# Patient Record
Sex: Female | Born: 1940 | Race: White | Hispanic: No | Marital: Married | Smoking: Former smoker
Health system: Southern US, Community
[De-identification: ages and names within clinical notes are randomized; demographics above are authoritative.]

## PROBLEM LIST (undated history)

## (undated) ENCOUNTER — Emergency Department (HOSPITAL_BASED_OUTPATIENT_CLINIC_OR_DEPARTMENT_OTHER): Discharge status: Absent without leave | Payer: Medicare PPO

## (undated) DIAGNOSIS — C801 Malignant (primary) neoplasm, unspecified: Secondary | ICD-10-CM

## (undated) DIAGNOSIS — Z9889 Other specified postprocedural states: Secondary | ICD-10-CM

## (undated) DIAGNOSIS — T4145XA Adverse effect of unspecified anesthetic, initial encounter: Secondary | ICD-10-CM

## (undated) DIAGNOSIS — T7840XA Allergy, unspecified, initial encounter: Secondary | ICD-10-CM

## (undated) DIAGNOSIS — M1990 Unspecified osteoarthritis, unspecified site: Secondary | ICD-10-CM

## (undated) DIAGNOSIS — K219 Gastro-esophageal reflux disease without esophagitis: Secondary | ICD-10-CM

## (undated) DIAGNOSIS — T8859XA Other complications of anesthesia, initial encounter: Secondary | ICD-10-CM

## (undated) DIAGNOSIS — R112 Nausea with vomiting, unspecified: Secondary | ICD-10-CM

## (undated) DIAGNOSIS — I499 Cardiac arrhythmia, unspecified: Secondary | ICD-10-CM

## (undated) DIAGNOSIS — M810 Age-related osteoporosis without current pathological fracture: Secondary | ICD-10-CM

## (undated) DIAGNOSIS — H269 Unspecified cataract: Secondary | ICD-10-CM

## (undated) HISTORY — PX: TUBAL LIGATION: SHX77

## (undated) HISTORY — PX: PARATHYROIDECTOMY: SHX19

## (undated) HISTORY — PX: APPENDECTOMY: SHX54

## (undated) HISTORY — DX: Malignant (primary) neoplasm, unspecified: C80.1

## (undated) HISTORY — DX: Age-related osteoporosis without current pathological fracture: M81.0

## (undated) HISTORY — PX: HERNIA REPAIR: SHX51

## (undated) HISTORY — DX: Allergy, unspecified, initial encounter: T78.40XA

---
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
## 1999-04-11 ENCOUNTER — Other Ambulatory Visit: Admission: RE | Admit: 1999-04-11 | Discharge: 1999-04-11 | Payer: Self-pay | Admitting: *Deleted

## 2000-04-09 ENCOUNTER — Other Ambulatory Visit: Admission: RE | Admit: 2000-04-09 | Discharge: 2000-04-09 | Payer: Self-pay | Admitting: *Deleted

## 2000-07-14 ENCOUNTER — Ambulatory Visit (HOSPITAL_COMMUNITY): Admission: RE | Admit: 2000-07-14 | Discharge: 2000-07-14 | Payer: Self-pay | Admitting: Gastroenterology

## 2000-11-19 ENCOUNTER — Other Ambulatory Visit: Admission: RE | Admit: 2000-11-19 | Discharge: 2000-11-19 | Payer: Self-pay | Admitting: Radiology

## 2001-03-04 HISTORY — PX: BREAST SURGERY: SHX581

## 2001-12-30 ENCOUNTER — Encounter: Payer: Self-pay | Admitting: General Surgery

## 2001-12-30 ENCOUNTER — Encounter: Admission: RE | Admit: 2001-12-30 | Discharge: 2001-12-30 | Payer: Self-pay | Admitting: General Surgery

## 2002-01-04 ENCOUNTER — Encounter (INDEPENDENT_AMBULATORY_CARE_PROVIDER_SITE_OTHER): Payer: Self-pay | Admitting: *Deleted

## 2002-01-04 ENCOUNTER — Encounter: Admission: RE | Admit: 2002-01-04 | Discharge: 2002-01-04 | Payer: Self-pay | Admitting: Surgery

## 2002-01-04 ENCOUNTER — Encounter: Payer: Self-pay | Admitting: Surgery

## 2002-01-04 ENCOUNTER — Ambulatory Visit (HOSPITAL_BASED_OUTPATIENT_CLINIC_OR_DEPARTMENT_OTHER): Admission: RE | Admit: 2002-01-04 | Discharge: 2002-01-04 | Payer: Self-pay | Admitting: Surgery

## 2002-06-30 ENCOUNTER — Other Ambulatory Visit: Admission: RE | Admit: 2002-06-30 | Discharge: 2002-06-30 | Payer: Self-pay | Admitting: Obstetrics and Gynecology

## 2003-07-05 ENCOUNTER — Encounter: Admission: RE | Admit: 2003-07-05 | Discharge: 2003-07-05 | Payer: Self-pay | Admitting: Internal Medicine

## 2003-08-10 ENCOUNTER — Other Ambulatory Visit: Admission: RE | Admit: 2003-08-10 | Discharge: 2003-08-10 | Payer: Self-pay | Admitting: *Deleted

## 2004-07-26 ENCOUNTER — Other Ambulatory Visit: Admission: RE | Admit: 2004-07-26 | Discharge: 2004-07-26 | Payer: Self-pay | Admitting: Internal Medicine

## 2005-03-26 ENCOUNTER — Encounter: Admission: RE | Admit: 2005-03-26 | Discharge: 2005-03-26 | Payer: Self-pay | Admitting: Internal Medicine

## 2005-08-16 ENCOUNTER — Other Ambulatory Visit: Admission: RE | Admit: 2005-08-16 | Discharge: 2005-08-16 | Payer: Self-pay | Admitting: Internal Medicine

## 2006-04-15 ENCOUNTER — Encounter: Admission: RE | Admit: 2006-04-15 | Discharge: 2006-04-15 | Payer: Self-pay | Admitting: Internal Medicine

## 2006-09-30 ENCOUNTER — Other Ambulatory Visit: Admission: RE | Admit: 2006-09-30 | Discharge: 2006-09-30 | Payer: Self-pay | Admitting: Internal Medicine

## 2008-11-01 ENCOUNTER — Inpatient Hospital Stay (HOSPITAL_COMMUNITY): Admission: EM | Admit: 2008-11-01 | Discharge: 2008-11-02 | Payer: Self-pay | Admitting: Emergency Medicine

## 2009-05-30 ENCOUNTER — Encounter: Admission: RE | Admit: 2009-05-30 | Discharge: 2009-05-30 | Payer: Self-pay | Admitting: Emergency Medicine

## 2009-09-08 ENCOUNTER — Encounter: Admission: RE | Admit: 2009-09-08 | Discharge: 2009-09-08 | Payer: Self-pay | Admitting: Emergency Medicine

## 2009-09-29 ENCOUNTER — Encounter: Admission: RE | Admit: 2009-09-29 | Discharge: 2009-09-29 | Payer: Self-pay | Admitting: Emergency Medicine

## 2010-01-23 ENCOUNTER — Encounter: Admission: RE | Admit: 2010-01-23 | Discharge: 2010-01-23 | Payer: Self-pay | Admitting: Emergency Medicine

## 2010-05-01 ENCOUNTER — Other Ambulatory Visit: Payer: Self-pay | Admitting: Dermatology

## 2010-06-08 LAB — HEMOGLOBIN A1C
Hgb A1c MFr Bld: 5.5 % (ref 4.6–6.1)
Mean Plasma Glucose: 111 mg/dL

## 2010-06-08 LAB — CARDIAC PANEL(CRET KIN+CKTOT+MB+TROPI)
CK, MB: 0.9 ng/mL (ref 0.3–4.0)
Total CK: 56 U/L (ref 7–177)
Troponin I: 0.01 ng/mL (ref 0.00–0.06)

## 2010-06-08 LAB — LIPID PANEL
HDL: 60 mg/dL (ref >39)
Total CHOL/HDL Ratio: 3.1 RATIO

## 2010-06-09 LAB — COMPREHENSIVE METABOLIC PANEL
ALT: 20 U/L (ref 0–35)
Chloride: 103 mEq/L (ref 96–112)
GFR calc Af Amer: >60 mL/min (ref >60)
Potassium: 3.7 mEq/L (ref 3.5–5.1)
Sodium: 141 mEq/L (ref 135–145)
Total Protein: 7.2 g/dL (ref 6.0–8.3)

## 2010-06-09 LAB — PTH, INTACT AND CALCIUM
Calcium, Total (PTH): 10.9 mg/dL — ABNORMAL HIGH (ref 8.4–10.5)
PTH: 95.2 pg/mL — ABNORMAL HIGH (ref 14.0–72.0)

## 2010-06-09 LAB — APTT: aPTT: 27 seconds (ref 24–37)

## 2010-06-09 LAB — VITAMIN B12: Vitamin B-12: 701 pg/mL (ref 211–911)

## 2010-06-09 LAB — DIFFERENTIAL
Basophils Absolute: 0 10*3/uL (ref 0.0–0.1)
Eosinophils Relative: 1 % (ref 0–5)
Lymphs Abs: 1.2 10*3/uL (ref 0.7–4.0)
Neutrophils Relative %: 71 % (ref 43–77)

## 2010-06-09 LAB — CBC
Hemoglobin: 14.3 g/dL (ref 12.0–15.0)
Platelets: 255 10*3/uL (ref 150–400)
RBC: 4.77 MIL/uL (ref 3.87–5.11)

## 2010-06-09 LAB — CK TOTAL AND CKMB (NOT AT ARMC)
CK, MB: 1.6 ng/mL (ref 0.3–4.0)
Relative Index: INVALID (ref 0.0–2.5)

## 2010-06-09 LAB — PROTIME-INR
INR: 0.9 (ref 0.00–1.49)
Prothrombin Time: 12.4 seconds (ref 11.6–15.2)

## 2010-06-09 LAB — TROPONIN I: Troponin I: 0.04 ng/mL (ref 0.00–0.06)

## 2010-06-09 LAB — CARDIAC PANEL(CRET KIN+CKTOT+MB+TROPI): Total CK: 67 U/L (ref 7–177)

## 2010-07-17 NOTE — Discharge Summary (Signed)
[name], [name]            ACCOUNT NO.:  [id number]   MEDICAL RECORD NO.:  [id number]          PATIENT TYPE:  INP   LOCATION:  4734                         FACILITY:  [facility]   PHYSICIAN:  Peggye Pitt, M.D. DATE OF BIRTH:  02-05-1941   DATE OF ADMISSION:  11/01/2008  DATE OF DISCHARGE:  11/02/2008                               DISCHARGE SUMMARY   DISCHARGE DIAGNOSES:  1. Atypical chest pain, ruled out for acute coronary syndrome.  2. Hypercalcemia.  3. Glaucoma.  4. Gastroesophageal reflux disease.  5. Intraductal papilloma of the right breast.   DISCHARGE MEDICATIONS:  1. Aspirin 81 mg daily.  2. Xalatan 0.005 eye drops 1 drop in each eye at bedtime.   DISPOSITION AND FOLLOWUP:  [name] will be discharged home today  in stable condition.  She is instructed to follow up with her primary  care physician, Dr. Nicholos Fox in about 4 weeks, at that time a  calcium level should be drawn, as she has been hypercalcemic this  hospitalization.  At the time of followup, PCP should also assess need  for outpatient stress test.  I do not believe at this point strongly  that she needs a stress test given the characteristics of her chest pain  and the fact that she has absolutely no coronary artery disease risk  factors.   CONSULTATION THIS HOSPITALIZATION:  None.   IMAGES AND PROCEDURES:  A chest x-ray on November 01, 2008, that showed no  acute process.   HISTORY AND PHYSICAL EXAMINATION:  For full details, please see  dictation by Dr. Radonna Fox on November 01, 2008.  In brief, [name] is a  70 year old Caucasian woman with history of GERD and intraductal  papilloma of the right breast who presents to the hospital with a chest  pain.  She described the pain as a tightness over her left chest area  that had no radiation, was not related to exertion.  For further  details, see H and P.  Because of this, she was admitted to our service  for further evaluation and  management.   HOSPITAL COURSE:  1. Chest pain.  She has ruled out for acute coronary syndrome by the      way of 3 sets of negative cardiac enzymes and an EKG that showed no      acute ST or T-wave changes.  She does not have any coronary artery      disease risk factors.  A fasting lipid profile was drawn to further      risk stratify and a total cholesterol is 184, triglycerides of 59,      and HDL of 60 and LDL of 112.  She does not have hyperlipidemia.  I      did not feel strongly that a stress test is needed, I will defer      this to Dr. Nicholos Fox.  2. Hypercalcemia.  Upon admission, her calcium level was found to be      11.2 with a normal albumin, so this is a true calcium level.  It      appears that she takes  calcium supplementation 3 times a day and      this could indeed be the source of her hypercalcemia.  I have asked      her to discontinue all calcium supplementation.  A PTH has been      ordered but is pending at the time of this dictation.  I would      suggest that she had a calcium level rechecked in about 4 weeks at      Dr. Carolyn Fox office.  3. Rest of her chronic medical problems have not been an issue this      hospitalization.  4. Vital signs on day of discharge, blood pressure 122/75, heart rate      64, respirations 20, O2 sats 99% on room air with a temp of 97.4.      Peggye Pitt, M.D.  Electronically Signed     EH/MEDQ  D:  11/02/2008  T:  11/03/2008  Job:  [id number]   cc:   Georgianne Fick, M.D.

## 2010-07-17 NOTE — H&P (Signed)
Hailey Fox, Hailey Fox NO.:  [id number]   MEDICAL RECORD NO.:  [id number]          PATIENT TYPE:  INP   LOCATION:  4734                         FACILITY:  [facility]   PHYSICIAN:  Marinda Elk, M.D.DATE OF BIRTH:  06/21/1940   DATE OF ADMISSION:  11/01/2008  DATE OF DISCHARGE:                              HISTORY & PHYSICAL   PRIMARY CARE PHYSICIAN:  Dr. Mia Fox.   HISTORY OF PRESENT ILLNESS:  [name] is a 70 year old with no  significant cardiac risk factors that comes in for chest discomfort that  started at 4 a.m.  She relates this started as chest discomfort, which  eventually went to her chest that she describe as chest tightness.  She  says in the past she has had GERD, but this is felt differently.  She  relates that nothing makes it worse, but was relieved with oxygen and  nitroglycerins.  She relates she was able to come walking to the ED with  no exertional chest pain.  She relates no palpitation, no sweating, no  nausea, no vomiting.  No change in her vision.  At no point she did feel  lightheadedness.   ALLERGIES:  She is allergic to MACROBID and IV DYE, she gets hives.   PAST MEDICAL HISTORY:  Significant only for,  1. Right breast biopsy that showed intraductal papilloma.  2. GERD.  3. Glaucoma.   MEDICATIONS:  1. Xalatan 0.005% 1 drop in each eye at bedtime.  2. Calcium 1 tablet three times a day.  3. Excedrin, she does not know the dose.  Her last dose was last week.  4. She also takes a combination of acetaminophen, caffeine, aspirin,      which her last dose was 2 weeks ago.   SOCIAL HISTORY:  She drinks one cocktail daily.  Denies tobacco and  lives in [place] with her husband.   FAMILY HISTORY:  Her father died at the age of 70 of pneumonia and her  mother died at the age of 16 of complications of dementia.   REVIEW OF SYSTEMS:  Noncontributory.   REVIEW OF SYSTEMS:  VITAL SIGNS:  Her temperature is 97.2, heart rate is  79, respiration is 18, her blood pressure is 153/70.  She is breathing  99% on room air.  GENERAL APPEARANCE:  She appears in acute distress, lying in bed  comfortably, and talking fluently.  HEENT:  Eyes are anicteric.  NECK:  Supple.  No thyromegaly.  No bruits.  CARDIOVASCULAR:  She has a regular rate and rhythm with a positive S1  and S2 and a systolic ejection murmur with radiation to the axilla.  No  murmurs, no rubs, or gallops.  LUNGS:  She has good air movement and clear to auscultation.  ABDOMEN:  She has positive bowel sounds, nontender, nondistended, and  soft.  EXTREMITIES:  Positive pulses.  No edema.  SKIN:  No ecchymosis and no significant skin changes.  NEUROLOGICAL:  Cranial nerves III through XII are grossly intact.  Sensation is intact throughout.  Muscle strength is 5/5 in all 4  extremities.  Reflexes are 2+ bilaterally  and equal.  Finger-to-nose is  negative.  Proprioception is preserved.  Babinski is negative.   LABS ON ADMISSION:  CBC, white count 5.9, hemoglobin of 14.3 with  platelets of 255, an ANC of 4.2, and an MCV of 87.5.  Cardiac enzymes,  CK 75, CK-MB 1.6, and relative index could not be calculated.  Her  sodium is 141, potassium 3.7, chloride 103, bicarb of 28, glucose of  127, BUN of 13, creatinine of 0.9 with a GFR greater than 60, bilirubin  0.7, alkaline phosphatase 53, AST 29, ALT 20, total protein 7.2, albumin  4.3, and calcium of 11.2.  Troponin 0.04.  Chest x-ray shows cardiac  silhouette, normal size, normal shape, no calcifications, no pulmonary  edema, consolidation, pleural effusion, or evidence.  There is no  widened mediastinum and some mild osteopenia.  EKG shows normal sinus  rhythm with right axis deviation, no right ventricular hypertrophy, no T-  wave changes, no ST-segment changes.  No previous EKG to compare.   PROBLEM:  1. Chest pain.  We will admit the patient to a telemetry unit.  We      will check cardiac enzymes x3 q.8 h.  and an EKG in the morning.  We      will give her aspirin 325 now and 81 mg daily.  If cardiac enzymes      are negative and no changes on EKG in the morning, she will need a      stress test as an outpatient.  Her chest pain seems pretty      atypical.  She does take NSAIDs on and off and does consume alcohol      daily, which can be contributing or worsening her GERD or causing      her gastritis.  We will follow hemoglobin, FOBT, stools.  We will      also check a fasting lipid panel, TSH, B12, and a 2-D echo.  2. Glaucoma.  We will continue her Xalatan daily.  3. Prophylaxis.  We will do heparin prophylaxis and Protonix b.i.d.  4. Alcohol abuse.  We will start her on thiamine and folate and we      will monitor with CIWA protocol.  Also counseled her about reducing      her alcohol intake.  5. Hypercalcemia.  We will start calcium as this could be contributing      to her hypercalcemia.  She could not tell how much calcium is she      taking or is she taking it every day.  So, we will give her IV      fluids.  We will check a PTH, phosphorus, and vitamin D.  We will      do further management depending on results.  Her calcium supplement      could be contributing to this and she is on no other medication      which could be causing this.      Marinda Elk, M.D.  Electronically Signed     AF/MEDQ  D:  11/01/2008  T:  11/02/2008  Job:  [id number]

## 2010-07-20 NOTE — Op Note (Signed)
Hailey Fox, Hailey Fox                   ACCOUNT NO.:  [id number]   MEDICAL RECORD NO.:  [id number]                   PATIENT TYPE:  AMB   LOCATION:  DSC                                  FACILITY:  [facility]   PHYSICIAN:  Currie Paris, M.D.           DATE OF BIRTH:  07-Aug-1940   DATE OF PROCEDURE:  01/04/2002  DATE OF DISCHARGE:                                 OPERATIVE REPORT   [facility] [phone].   PREOPERATIVE DIAGNOSIS:  Probable intraductal papilloma, right side.   POSTOPERATIVE DIAGNOSIS:  Probable intraductal papilloma, right side.   PROCEDURE:  Right breast biopsy.   SURGEON:  Currie Paris, M.D.   ANESTHESIA:  General.   CLINICAL HISTORY:  This patient recently developed a bloody nipple discharge  and a ductogram was done.  This showed what looked like a papilloma and  after discussion with the patient, she elected to have this removed.   DESCRIPTION OF PROCEDURE:  The patient was seen in the holding area and had  no further questions.  She already had some blue dye injected into the right  milk duct that was producing the bloody nipple discharge, and this was  identified and the right side confirmed as the operative side.   She was taken to the operating room and after satisfactory general  anesthesia, the breast was prepped and draped.  I made a curvilinear  incision along the lateral aspect of the areolar margin and raised a flap  going toward the nipple itself.  I had already placed a tiny tear duct probe  into the nipple, and it went in all the way to the hilt.  Once I was able to  identify the duct, I then began to take excision around it, using the probe  in it to maintain some traction and also a general sense of direction.  Most  of this was done with cautery, and most of what I saw looked like some  fibrocystic disease with some creamy material consistent with that.  I got  beyond the tip of the probe, and I felt that I had adequate excision.   This  actually seemed to go fairly centrally down into the breast rather than too  far out laterally, but it did go somewhat laterally.   Once this was out, I could palpate a small mass within the specimen and felt  that I had an adequate excision.   The wound was checked for hemostasis, and that was controlled with the  cautery.  The wound was then closed with 3-0 Vicryl, followed by 4-0  Monocryl subcuticular and Steri-Strips.  The patient tolerated the procedure  well.  There were no operative complications.  All counts were correct.  Currie Paris, M.D.    CJS/MEDQ  D:  01/04/2002  T:  01/04/2002  Job:  [id number]   cc:   Elsie Stain, M.D.  [facility]  [address]  [place] [address]  Fax: [phone]   Neta Mends. Fabian Sharp, M.D. [facility]

## 2010-07-20 NOTE — Procedures (Signed)
[facility]  Patient:    Hailey Fox, Hailey Fox                MRN: [id number] Proc. Date: 07/14/00 Adm. Date:  [id number] Attending:  Louie Bun CC:         Andres Ege, M.D.   Procedure Report  PROCEDURE:  Colonoscopy.  INDICATION FOR PROCEDURE:  Personal history of colon polyps with last colonoscopy five years ago.  DESCRIPTION OF PROCEDURE:  The patient was placed in the left lateral decubitus position and placed on the pulse monitor with continuous low-flow oxygen delivered by nasal cannula.  She was sedated with 50 mg IV Demerol and 6 mg IV Versed.  The Olympus video colonoscope was inserted into the rectum and advanced to the cecum, confirmed by transillumination at McBurneys point and visualization of the ileocecal valve and appendiceal orifice.  The prep was good.  The cecum, ascending, transverse, descending, and sigmoid colon all appeared normal with no masses, polyps, diverticula, or other mucosal abnormalities.  The rectum likewise appeared normal, and retroflex view of the anus revealed only small internal hemorrhoids.  The colonoscope was then withdrawn, and the patient returned to the recovery room in stable condition. She tolerated the procedure well, and there were no immediate complications.  IMPRESSION:  Small internal hemorrhoids, otherwise normal colonoscopy.  PLAN:  Repeat colonoscopy in five years. DD:  07/14/00 TD:  07/14/00 Job: [id number]

## 2010-08-27 ENCOUNTER — Other Ambulatory Visit: Payer: Self-pay | Admitting: Emergency Medicine

## 2010-08-27 DIAGNOSIS — R102 Pelvic and perineal pain: Secondary | ICD-10-CM

## 2010-08-30 ENCOUNTER — Ambulatory Visit
Admission: RE | Admit: 2010-08-30 | Discharge: 2010-08-30 | Disposition: A | Discharge status: Home / Self Care | Payer: Medicare Other | Source: Ambulatory Visit | Attending: Emergency Medicine | Admitting: Emergency Medicine

## 2010-08-30 DIAGNOSIS — R102 Pelvic and perineal pain: Secondary | ICD-10-CM

## 2012-04-03 ENCOUNTER — Ambulatory Visit
Admission: RE | Admit: 2012-04-03 | Discharge: 2012-04-03 | Disposition: A | Discharge status: Home / Self Care | Payer: Medicare Other | Source: Ambulatory Visit | Attending: Family Medicine | Admitting: Family Medicine

## 2012-04-03 ENCOUNTER — Other Ambulatory Visit: Payer: Self-pay | Admitting: Family Medicine

## 2012-04-03 DIAGNOSIS — R05 Cough: Secondary | ICD-10-CM

## 2012-05-22 ENCOUNTER — Ambulatory Visit (INDEPENDENT_AMBULATORY_CARE_PROVIDER_SITE_OTHER): Payer: Self-pay | Admitting: Surgery

## 2012-05-26 ENCOUNTER — Ambulatory Visit (INDEPENDENT_AMBULATORY_CARE_PROVIDER_SITE_OTHER): Payer: Medicare Other | Admitting: Surgery

## 2012-05-26 ENCOUNTER — Encounter (INDEPENDENT_AMBULATORY_CARE_PROVIDER_SITE_OTHER): Payer: Self-pay | Admitting: Surgery

## 2012-05-26 VITALS — BP 128/88 | Temp 97.0°F | Resp 72 | Ht 62.5 in | Wt 137.4 lb

## 2012-05-26 DIAGNOSIS — E213 Hyperparathyroidism, unspecified: Secondary | ICD-10-CM | POA: Insufficient documentation

## 2012-05-26 DIAGNOSIS — K4090 Unilateral inguinal hernia, without obstruction or gangrene, not specified as recurrent: Secondary | ICD-10-CM

## 2012-05-26 NOTE — Patient Instructions (Signed)
Come back to see me when you're ready to go ahead and schedule surgery to repair your left inguinal hernia

## 2012-05-26 NOTE — Progress Notes (Signed)
Patient ID: Hailey Fox, female   DOB: 09-14-1940, 72 y.o.   MRN: [id number] NAME: STEFFI NOVIELLO DOB: 1940-12-01 MRN: [id number]                                                                                      DATE: 05/26/2012  PCP: Eartha Inch, MD Referring Provider: No ref. provider found  IMPRESSION:  1. Reducible left inguinal hernia 2. 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 Hyperparathyroidism  PLAN:   I recommended that he lipoma be left alone. I recommended that she consider repair of her left ankle hernia after she has completed evaluation for her hyperparathyroidism and likely parathyroid surgery which will be done for another month or 6 weeks. She'll call to come back and see me when she is ready to schedule                 CC:  Chief Complaint  Patient presents with  . Inguinal Hernia    Left and  thigh mass    HPI:  Hailey Fox is a 72 y.o.  female who presents for evaluation of a left inguinal hernia as well as a mass on the right thigh. The hernia been present about a year. It's basically asymptomatic. She noticeable when she stands up it goes away when she lies back down. It is not tender or painful. It always goes back and when she lies down. She's also noticed a small soft mass in the posterior right thigh that has not changed any since she first noticed a year ago. Likewise it is asymptomatic. She also notes that she has been evaluated for high calcium levels and elevated parathormone levels. She surgical consultation for possible parathyroidectomy pending with a physician in [place]. PMH:  has a past medical history of Osteoporosis; Allergy; and Cancer.  PSH:   has past surgical history that includes Tubal ligation (early 80's); Appendectomy (early 26's); Tonsillectomy (1945); and Breast surgery (Right, 2003).  ALLERGIES:   Allergies  Allergen Reactions  . Codeine     blackouts  . Contrast Media (Iodinated Diagnostic Agents) Hives   . Macrodantin (Nitrofurantoin Macrocrystal)   . Ciprofloxacin     Rapid pulse  . Iohexol      Desc: IMMEDIATE DEVELOPMENT OF HIVES POST INJECTION     MEDICATIONS: Current outpatient prescriptions:aspirin 81 MG tablet, Take 81 mg by mouth daily., Disp: , Rfl: ;  aspirin-acetaminophen-caffeine (EXCEDRIN MIGRAINE) 250-250-65 MG per tablet, Take 1 tablet by mouth every 6 (six) hours as needed for pain., Disp: , Rfl: ;  azelastine (ASTELIN) 137 MCG/SPRAY nasal spray, , Disp: , Rfl: ;  Cholecalciferol (VITAMIN D-3 PO), Take 2,000 mg by mouth daily., Disp: , Rfl:  ibuprofen (ADVIL,MOTRIN) 200 MG tablet, Take 200 mg by mouth daily., Disp: , Rfl:   ROS: She has filled out our 12 point review of systems and it is negative . EXAM:   VITAL SIGNS:  BP 128/88  Temp(Src) 97 F (36.1 C) (Temporal)  Resp 72  Ht 5' 2.5" (1.588 m)  Wt 137 lb 6.4 oz (62.324 kg)  BMI 24.71 kg/m2  SpO2 97%  GENERAL:  The patient  is alert, oriented, and generally healthy-appearing, NAD. Mood and affect are normal.  HEENT:  The head is normocephalic, the eyes nonicteric, the pupils were round regular and equal. EOMs are normal. Pharynx normal. Dentition good.  NECK:  The neck is supple and there are no masses or thyromegaly.  LUNGS: Normal respirations and clear to auscultation.  HEART: Regular rhythm, with no murmurs rubs or gallops. Pulses are intact carotid dorsalis pedis and posterior tibial. No significant varicosities are noted.  BREASTS:  not examined  ABDOMEN: Soft, flat, and nontender. No masses or organomegaly is noted.a reducible left inguinal hernia is present. Bowel sounds are normal.  EXTREMITIES:  Good range of motion, no edema.there is approximately 4 cm indistinct but soft mass in the right posterior thigh. Somewhat tender freely mobile and I think represents a lipoma   DATA REVIEWED:  I reviewed the notes from her primary care office as well as our old office notes    Dijuan Sleeth  J 05/26/2012  CC: No ref. provider found, Eartha Inch, MD

## 2012-08-14 ENCOUNTER — Encounter (INDEPENDENT_AMBULATORY_CARE_PROVIDER_SITE_OTHER): Payer: Self-pay | Admitting: Surgery

## 2012-08-14 ENCOUNTER — Encounter (HOSPITAL_COMMUNITY): Payer: Self-pay | Admitting: Pharmacy Technician

## 2012-08-14 ENCOUNTER — Ambulatory Visit (INDEPENDENT_AMBULATORY_CARE_PROVIDER_SITE_OTHER): Payer: Medicare Other | Admitting: Surgery

## 2012-08-14 VITALS — BP 132/90 | HR 60 | Temp 97.2°F | Resp 15 | Ht 62.5 in | Wt 136.2 lb

## 2012-08-14 DIAGNOSIS — K4090 Unilateral inguinal hernia, without obstruction or gangrene, not specified as recurrent: Secondary | ICD-10-CM

## 2012-08-14 NOTE — Patient Instructions (Signed)
We'll schedule outpatient hernia repair

## 2012-08-14 NOTE — Progress Notes (Signed)
Patient ID: Hailey Fox, female   DOB: 1940/11/24, 72 y.o.   MRN: [id number] NAME: Hailey Fox DOB: March 01, 1941 MRN: [id number]                                                                                      DATE: 08/14/2012  PCP: Eartha Inch, MD Referring Provider: Eartha Inch, MD  IMPRESSION:  1. Reducible left inguinal hernia 2. 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 S/P parathyroidectomy  PLAN:  Will proceed with [facility] repair I have discussed with the patient the fact that he has an inguinal hernia and reviewed the pathophysiology of that diagnosis. I have given her educational materials about this. I discussed options for treatment including observation or repair and have discussed both laparoscopic and open inguinal hernia repairs as potential techniques. We have discussed the use of mesh. We have discussed risks of surgery including bleeding, infection, recurrence, postoperative pain and possible chronic groin pain. I believe the patient's questions have been answered and that he has a good understanding of the issues                  CC:  Chief Complaint  Patient presents with  . Follow-up    reck hernia / sched sx    HPI:  Hailey Fox is a 72 y.o.  female who presents for discussion of LIH repair. I saw her a few months ago and since she has done well. She had her parathyroidectomy done and did well with surgery. She would now like to schedule [facility] repair. PMH:  has a past medical history of Osteoporosis; Allergy; and Cancer.  PSH:   has past surgical history that includes Tubal ligation (early 52's); Appendectomy (early 69's); Tonsillectomy (1945); Breast surgery (Right, 2003); and Parathyroidectomy ([id number]).  ALLERGIES:   Allergies  Allergen Reactions  . Codeine     blackouts  . Contrast Media (Iodinated Diagnostic Agents) Hives  . Macrodantin (Nitrofurantoin Macrocrystal)   . Ciprofloxacin     Rapid pulse  . Iohexol      Desc:  IMMEDIATE DEVELOPMENT OF HIVES POST INJECTION     MEDICATIONS: Current outpatient prescriptions:aspirin 81 MG tablet, Take 81 mg by mouth daily., Disp: , Rfl: ;  aspirin-acetaminophen-caffeine (EXCEDRIN MIGRAINE) 250-250-65 MG per tablet, Take 1 tablet by mouth every 6 (six) hours as needed for pain., Disp: , Rfl: ;  azelastine (ASTELIN) 137 MCG/SPRAY nasal spray, , Disp: , Rfl: ;  chlorpheniramine (CHLOR-TRIMETON) 4 MG tablet, Take 4 mg by mouth 2 (two) times daily as needed for allergies., Disp: , Rfl:  Cholecalciferol (VITAMIN D-3 PO), Take 2,000 mg by mouth daily., Disp: , Rfl: ;  ibuprofen (ADVIL,MOTRIN) 200 MG tablet, Take 200 mg by mouth daily., Disp: , Rfl:   ROS: She has filled out our 12 point review of systems and it is negative . EXAM:   VITAL SIGNS:  BP 132/90  Pulse 60  Temp(Src) 97.2 F (36.2 C) (Temporal)  Resp 15  Ht 5' 2.5" (1.588 m)  Wt 136 lb 3.2 oz (61.78 kg)  BMI 24.5 kg/m2  GENERAL:  The patient is alert, oriented, and generally healthy-appearing, NAD.  Mood and affect are normal.  HEENT:  The head is normocephalic, the eyes nonicteric, the pupils were round regular and equal. EOMs are normal. Pharynx normal. Dentition good.  NECK:  The neck is supple and there are no masses or thyromegaly.  LUNGS: Normal respirations and clear to auscultation.  HEART: Regular rhythm, with no murmurs rubs or gallops. Pulses are intact carotid dorsalis pedis and posterior tibial. No significant varicosities are noted.  BREASTS:  not examined  ABDOMEN: Soft, flat, and nontender. No masses or organomegaly is noted.a reducible left inguinal hernia is present. Bowel sounds are normal.  EXTREMITIES:  Good range of motion, no edema.   DATA REVIEWED:  I reviewed the notes from her primary care office as well as our old office notes    Hunter Bachar J 08/14/2012  CC: Eartha Inch, MD, Eartha Inch, MD

## 2012-08-17 ENCOUNTER — Encounter (HOSPITAL_COMMUNITY): Payer: Self-pay | Admitting: *Deleted

## 2012-08-17 MED ORDER — CEFAZOLIN SODIUM-DEXTROSE 2-3 GM-% IV SOLR
2.0000 g | INTRAVENOUS | Status: AC
  Administered 2012-08-18: 2 g via INTRAVENOUS
  Filled 2012-08-17: qty 50

## 2012-08-18 ENCOUNTER — Ambulatory Visit (HOSPITAL_COMMUNITY)
Admission: RE | Admit: 2012-08-18 | Discharge: 2012-08-18 | Disposition: A | Discharge status: Home / Self Care | Payer: Medicare Other | Source: Ambulatory Visit | Attending: Surgery | Admitting: Surgery

## 2012-08-18 ENCOUNTER — Encounter (HOSPITAL_COMMUNITY): Payer: Self-pay | Admitting: Certified Registered"

## 2012-08-18 ENCOUNTER — Encounter (HOSPITAL_COMMUNITY): Payer: Self-pay | Admitting: *Deleted

## 2012-08-18 ENCOUNTER — Encounter (HOSPITAL_COMMUNITY)
Admission: RE | Disposition: A | Discharge status: Home / Self Care | Payer: Self-pay | Source: Ambulatory Visit | Attending: Surgery

## 2012-08-18 ENCOUNTER — Ambulatory Visit (HOSPITAL_COMMUNITY): Payer: Medicare Other | Admitting: Certified Registered"

## 2012-08-18 DIAGNOSIS — Z883 Allergy status to other anti-infective agents status: Secondary | ICD-10-CM | POA: Insufficient documentation

## 2012-08-18 DIAGNOSIS — Z79899 Other long term (current) drug therapy: Secondary | ICD-10-CM | POA: Insufficient documentation

## 2012-08-18 DIAGNOSIS — K219 Gastro-esophageal reflux disease without esophagitis: Secondary | ICD-10-CM | POA: Insufficient documentation

## 2012-08-18 DIAGNOSIS — K4090 Unilateral inguinal hernia, without obstruction or gangrene, not specified as recurrent: Secondary | ICD-10-CM

## 2012-08-18 DIAGNOSIS — Z7982 Long term (current) use of aspirin: Secondary | ICD-10-CM | POA: Insufficient documentation

## 2012-08-18 DIAGNOSIS — Z9089 Acquired absence of other organs: Secondary | ICD-10-CM | POA: Insufficient documentation

## 2012-08-18 DIAGNOSIS — I498 Other specified cardiac arrhythmias: Secondary | ICD-10-CM | POA: Insufficient documentation

## 2012-08-18 DIAGNOSIS — Z91041 Radiographic dye allergy status: Secondary | ICD-10-CM | POA: Insufficient documentation

## 2012-08-18 DIAGNOSIS — Z885 Allergy status to narcotic agent status: Secondary | ICD-10-CM | POA: Insufficient documentation

## 2012-08-18 DIAGNOSIS — M810 Age-related osteoporosis without current pathological fracture: Secondary | ICD-10-CM | POA: Insufficient documentation

## 2012-08-18 DIAGNOSIS — Z859 Personal history of malignant neoplasm, unspecified: Secondary | ICD-10-CM | POA: Insufficient documentation

## 2012-08-18 HISTORY — PX: INGUINAL HERNIA REPAIR: SHX194

## 2012-08-18 HISTORY — DX: Unspecified osteoarthritis, unspecified site: M19.90

## 2012-08-18 HISTORY — DX: Other complications of anesthesia, initial encounter: T88.59XA

## 2012-08-18 HISTORY — DX: Unspecified cataract: H26.9

## 2012-08-18 HISTORY — DX: Gastro-esophageal reflux disease without esophagitis: K21.9

## 2012-08-18 HISTORY — DX: Other specified postprocedural states: Z98.890

## 2012-08-18 HISTORY — DX: Cardiac arrhythmia, unspecified: I49.9

## 2012-08-18 HISTORY — DX: Nausea with vomiting, unspecified: R11.2

## 2012-08-18 HISTORY — DX: Adverse effect of unspecified anesthetic, initial encounter: T41.45XA

## 2012-08-18 LAB — SURGICAL PCR SCREEN: Staphylococcus aureus: NEGATIVE

## 2012-08-18 LAB — CBC
HCT: 40.6 % (ref 36.0–46.0)
Hemoglobin: 13.8 g/dL (ref 12.0–15.0)
MCV: 84.9 fL (ref 78.0–100.0)
WBC: 6.6 10*3/uL (ref 4.0–10.5)

## 2012-08-18 SURGERY — REPAIR, HERNIA, INGUINAL, ADULT
Anesthesia: General | Site: Groin | Laterality: Left | Wound class: Clean

## 2012-08-18 MED ORDER — MIDAZOLAM HCL 5 MG/5ML IJ SOLN
INTRAMUSCULAR | Status: DC | PRN
  Administered 2012-08-18: 1 mg via INTRAVENOUS

## 2012-08-18 MED ORDER — CHLORHEXIDINE GLUCONATE 4 % EX LIQD: 1.0000 "application " | Freq: Once | CUTANEOUS | Status: DC

## 2012-08-18 MED ORDER — ARTIFICIAL TEARS OP OINT
TOPICAL_OINTMENT | OPHTHALMIC | Status: DC | PRN
  Administered 2012-08-18: 1 via OPHTHALMIC

## 2012-08-18 MED ORDER — HYDROCODONE-ACETAMINOPHEN 5-325 MG PO TABS: 1.0000 | ORAL_TABLET | ORAL | Status: DC | PRN

## 2012-08-18 MED ORDER — ROCURONIUM BROMIDE 100 MG/10ML IV SOLN
INTRAVENOUS | Status: DC | PRN
  Administered 2012-08-18: 35 mg via INTRAVENOUS

## 2012-08-18 MED ORDER — BUPIVACAINE-EPINEPHRINE (PF) 0.5% -1:200000 IJ SOLN
INTRAMUSCULAR | Status: AC
  Filled 2012-08-18: qty 10

## 2012-08-18 MED ORDER — FENTANYL CITRATE 0.05 MG/ML IJ SOLN
INTRAMUSCULAR | Status: DC | PRN
  Administered 2012-08-18 (×3): 50 ug via INTRAVENOUS

## 2012-08-18 MED ORDER — PROPOFOL 10 MG/ML IV BOLUS
INTRAVENOUS | Status: DC | PRN
  Administered 2012-08-18: 120 mg via INTRAVENOUS

## 2012-08-18 MED ORDER — ONDANSETRON HCL 4 MG/2ML IJ SOLN
INTRAMUSCULAR | Status: DC | PRN
  Administered 2012-08-18: 4 mg via INTRAVENOUS

## 2012-08-18 MED ORDER — LACTATED RINGERS IV SOLN
INTRAVENOUS | Status: DC | PRN
  Administered 2012-08-18 (×2): via INTRAVENOUS

## 2012-08-18 MED ORDER — LACTATED RINGERS IV SOLN
INTRAVENOUS | Status: DC
  Administered 2012-08-18: 09:00:00 via INTRAVENOUS

## 2012-08-18 MED ORDER — BUPIVACAINE HCL (PF) 0.25 % IJ SOLN
INTRAMUSCULAR | Status: AC
  Filled 2012-08-18: qty 30

## 2012-08-18 MED ORDER — GLYCOPYRROLATE 0.2 MG/ML IJ SOLN
INTRAMUSCULAR | Status: DC | PRN
  Administered 2012-08-18: 0.4 mg via INTRAVENOUS
  Administered 2012-08-18: 0.2 mg via INTRAVENOUS

## 2012-08-18 MED ORDER — MUPIROCIN 2 % EX OINT
TOPICAL_OINTMENT | CUTANEOUS | Status: AC
  Filled 2012-08-18: qty 22

## 2012-08-18 MED ORDER — MUPIROCIN 2 % EX OINT
TOPICAL_OINTMENT | Freq: Two times a day (BID) | CUTANEOUS | Status: DC
  Administered 2012-08-18: 07:00:00 via NASAL

## 2012-08-18 MED ORDER — DEXAMETHASONE SODIUM PHOSPHATE 10 MG/ML IJ SOLN
INTRAMUSCULAR | Status: DC | PRN
  Administered 2012-08-18: 8 mg via INTRAVENOUS

## 2012-08-18 MED ORDER — NEOSTIGMINE METHYLSULFATE 1 MG/ML IJ SOLN
INTRAMUSCULAR | Status: DC | PRN
  Administered 2012-08-18: 3 mg via INTRAVENOUS

## 2012-08-18 MED ORDER — LIDOCAINE HCL (CARDIAC) 20 MG/ML IV SOLN
INTRAVENOUS | Status: DC | PRN
  Administered 2012-08-18: 60 mg via INTRAVENOUS

## 2012-08-18 MED ORDER — BUPIVACAINE HCL (PF) 0.25 % IJ SOLN
INTRAMUSCULAR | Status: DC | PRN
  Administered 2012-08-18: 30 mL

## 2012-08-18 SURGICAL SUPPLY — 52 items
ADH SKN CLS APL DERMABOND .7 (GAUZE/BANDAGES/DRESSINGS) ×1
BLADE SURG 10 STRL SS (BLADE) ×2 IMPLANT
BLADE SURG 15 STRL LF DISP TIS (BLADE) ×1 IMPLANT
BLADE SURG 15 STRL SS (BLADE) ×2
BLADE SURG ROTATE 9660 (MISCELLANEOUS) ×1 IMPLANT
CANISTER SUCTION 2500CC (MISCELLANEOUS) ×1 IMPLANT
CHLORAPREP W/TINT 26ML (MISCELLANEOUS) ×2 IMPLANT
CLOTH BEACON ORANGE TIMEOUT ST (SAFETY) ×2 IMPLANT
COVER SURGICAL LIGHT HANDLE (MISCELLANEOUS) ×2 IMPLANT
DECANTER SPIKE VIAL GLASS SM (MISCELLANEOUS) IMPLANT
DERMABOND ADVANCED (GAUZE/BANDAGES/DRESSINGS) ×1
DERMABOND ADVANCED .7 DNX12 (GAUZE/BANDAGES/DRESSINGS) ×1 IMPLANT
DRAIN PENROSE 1/2X12 LTX STRL (WOUND CARE) IMPLANT
DRAPE LAPAROTOMY TRNSV 102X78 (DRAPE) ×2 IMPLANT
DRAPE UTILITY 15X26 W/TAPE STR (DRAPE) ×4 IMPLANT
ELECT CAUTERY BLADE 6.4 (BLADE) ×2 IMPLANT
ELECT REM PT RETURN 9FT ADLT (ELECTROSURGICAL) ×2
ELECTRODE REM PT RTRN 9FT ADLT (ELECTROSURGICAL) ×1 IMPLANT
GLOVE EUDERMIC 7 POWDERFREE (GLOVE) ×2 IMPLANT
GLOVE SURG SS PI 6.5 STRL IVOR (GLOVE) ×1 IMPLANT
GLOVE SURG SS PI 7.0 STRL IVOR (GLOVE) ×1 IMPLANT
GOWN STRL NON-REIN LRG LVL3 (GOWN DISPOSABLE) ×2 IMPLANT
GOWN STRL REIN XL XLG (GOWN DISPOSABLE) ×2 IMPLANT
KIT BASIN OR (CUSTOM PROCEDURE TRAY) ×2 IMPLANT
KIT ROOM TURNOVER OR (KITS) ×2 IMPLANT
MESH HERNIA 3X6 (Mesh General) ×1 IMPLANT
NDL HYPO 25GX1X1/2 BEV (NEEDLE) ×1 IMPLANT
NEEDLE 22X1 1/2 (OR ONLY) (NEEDLE) ×2 IMPLANT
NEEDLE HYPO 25GX1X1/2 BEV (NEEDLE) ×2 IMPLANT
NS IRRIG 1000ML POUR BTL (IV SOLUTION) ×2 IMPLANT
PACK SURGICAL SETUP 50X90 (CUSTOM PROCEDURE TRAY) ×2 IMPLANT
PAD ARMBOARD 7.5X6 YLW CONV (MISCELLANEOUS) ×4 IMPLANT
PENCIL BUTTON HOLSTER BLD 10FT (ELECTRODE) ×2 IMPLANT
SPECIMEN JAR SMALL (MISCELLANEOUS) IMPLANT
SPONGE INTESTINAL PEANUT (DISPOSABLE) ×2 IMPLANT
SPONGE LAP 4X18 X RAY DECT (DISPOSABLE) ×2 IMPLANT
SUT MNCRL AB 4-0 PS2 18 (SUTURE) ×2 IMPLANT
SUT PROLENE 2 0 CT2 30 (SUTURE) ×5 IMPLANT
SUT SILK 2 0 (SUTURE)
SUT SILK 2 0 SH (SUTURE) IMPLANT
SUT SILK 2-0 18XBRD TIE 12 (SUTURE) IMPLANT
SUT VIC AB 3-0 54X BRD REEL (SUTURE) ×1 IMPLANT
SUT VIC AB 3-0 BRD 54 (SUTURE) ×2
SUT VIC AB 3-0 CT1 27 (SUTURE) ×6
SUT VIC AB 3-0 CT1 TAPERPNT 27 (SUTURE) ×1 IMPLANT
SYR BULB 3OZ (MISCELLANEOUS) ×2 IMPLANT
SYR CONTROL 10ML LL (SYRINGE) ×2 IMPLANT
TOWEL OR 17X24 6PK STRL BLUE (TOWEL DISPOSABLE) ×2 IMPLANT
TOWEL OR 17X26 10 PK STRL BLUE (TOWEL DISPOSABLE) ×2 IMPLANT
TUBE CONNECTING 12X1/4 (SUCTIONS) ×1 IMPLANT
WATER STERILE IRR 1000ML POUR (IV SOLUTION) IMPLANT
YANKAUER SUCT BULB TIP NO VENT (SUCTIONS) ×1 IMPLANT

## 2012-08-18 NOTE — Anesthesia Preprocedure Evaluation (Addendum)
Anesthesia Evaluation  Patient identified by MRN, date of birth, ID band Patient awake    Reviewed: Allergy & Precautions, H&P , NPO status , Patient's Chart, lab work & pertinent test results, reviewed documented beta blocker date and time   History of Anesthesia Complications (+) PONV  Airway Mallampati: II TM Distance: >3 FB Neck ROM: Full    Dental  (+) Teeth Intact and Dental Advisory Given Lower left bridge slightly loose:   Pulmonary  breath sounds clear to auscultation        Cardiovascular + dysrhythmias Rhythm:Regular Rate:Normal     Neuro/Psych    GI/Hepatic Neg liver ROS, GERD-  Controlled,  Endo/Other  negative endocrine ROS  Renal/GU negative Renal ROS     Musculoskeletal   Abdominal   Peds  Hematology   Anesthesia Other Findings   Reproductive/Obstetrics                        Anesthesia Physical Anesthesia Plan  ASA: III  Anesthesia Plan: General   Post-op Pain Management:    Induction: Intravenous  Airway Management Planned: LMA  Additional Equipment:   Intra-op Plan:   Post-operative Plan: Extubation in OR  Informed Consent: I have reviewed the patients History and Physical, chart, labs and discussed the procedure including the risks, benefits and alternatives for the proposed anesthesia with the patient or authorized representative who has indicated his/her understanding and acceptance.   Dental advisory given  Plan Discussed with: CRNA, Anesthesiologist and Surgeon  Anesthesia Plan Comments:         Anesthesia Quick Evaluation

## 2012-08-18 NOTE — Anesthesia Postprocedure Evaluation (Signed)
  Anesthesia Post-op Note  Patient: Hailey Fox  Procedure(s) Performed: Procedure(s): LEFT INGUINAL HERNIA REPAIR (Left)  Patient Location: PACU  Anesthesia Type:General  Level of Consciousness: awake  Airway and Oxygen Therapy: Patient Spontanous Breathing  Post-op Pain: mild  Post-op Assessment: Post-op Vital signs reviewed  Post-op Vital Signs: Reviewed  Complications: No apparent anesthesia complications

## 2012-08-18 NOTE — H&P (View-Only) (Signed)
Patient ID: Hailey Fox, female   DOB: 09/21/1940, 71 y.o.   MRN: [id number] NAME: Hailey Fox DOB: 05/22/1940 MRN: [id number]                                                                                      DATE: 08/14/2012  PCP: BADGER,MICHAEL C, MD Referring Provider: Badger, Michael C, MD  IMPRESSION:  1. Reducible left inguinal hernia 2. 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 S/P parathyroidectomy  PLAN:  Will proceed with LIH repair I have discussed with the patient the fact that he has an inguinal hernia and reviewed the pathophysiology of that diagnosis. I have given her educational materials about this. I discussed options for treatment including observation or repair and have discussed both laparoscopic and open inguinal hernia repairs as potential techniques. We have discussed the use of mesh. We have discussed risks of surgery including bleeding, infection, recurrence, postoperative pain and possible chronic groin pain. I believe the patient's questions have been answered and that he has a good understanding of the issues                  CC:  Chief Complaint  Patient presents with  . Follow-up    reck hernia / sched sx    HPI:  Hailey Fox is a 71 y.o.  female who presents for discussion of LIH repair. I saw her a few months ago and since she has done well. She had her parathyroidectomy done and did well with surgery. She would now like to schedule LIH repair. PMH:  has a past medical history of Osteoporosis; Allergy; and Cancer.  PSH:   has past surgical history that includes Tubal ligation (early 80's); Appendectomy (early 80's); Tonsillectomy (1945); Breast surgery (Right, 2003); and Parathyroidectomy ([id number]).  ALLERGIES:   Allergies  Allergen Reactions  . Codeine     blackouts  . Contrast Media (Iodinated Diagnostic Agents) Hives  . Macrodantin (Nitrofurantoin Macrocrystal)   . Ciprofloxacin     Rapid pulse  . Iohexol      Desc:  IMMEDIATE DEVELOPMENT OF HIVES POST INJECTION     MEDICATIONS: Current outpatient prescriptions:aspirin 81 MG tablet, Take 81 mg by mouth daily., Disp: , Rfl: ;  aspirin-acetaminophen-caffeine (EXCEDRIN MIGRAINE) 250-250-65 MG per tablet, Take 1 tablet by mouth every 6 (six) hours as needed for pain., Disp: , Rfl: ;  azelastine (ASTELIN) 137 MCG/SPRAY nasal spray, , Disp: , Rfl: ;  chlorpheniramine (CHLOR-TRIMETON) 4 MG tablet, Take 4 mg by mouth 2 (two) times daily as needed for allergies., Disp: , Rfl:  Cholecalciferol (VITAMIN D-3 PO), Take 2,000 mg by mouth daily., Disp: , Rfl: ;  ibuprofen (ADVIL,MOTRIN) 200 MG tablet, Take 200 mg by mouth daily., Disp: , Rfl:   ROS: She has filled out our 12 point review of systems and it is negative . EXAM:   VITAL SIGNS:  BP 132/90  Pulse 60  Temp(Src) 97.2 F (36.2 C) (Temporal)  Resp 15  Ht 5' 2.5" (1.588 m)  Wt 136 lb 3.2 oz (61.78 kg)  BMI 24.5 kg/m2  GENERAL:  The patient is alert, oriented, and generally healthy-appearing, NAD.   Mood and affect are normal.  HEENT:  The head is normocephalic, the eyes nonicteric, the pupils were round regular and equal. EOMs are normal. Pharynx normal. Dentition good.  NECK:  The neck is supple and there are no masses or thyromegaly.  LUNGS: Normal respirations and clear to auscultation.  HEART: Regular rhythm, with no murmurs rubs or gallops. Pulses are intact carotid dorsalis pedis and posterior tibial. No significant varicosities are noted.  BREASTS:  not examined  ABDOMEN: Soft, flat, and nontender. No masses or organomegaly is noted.a reducible left inguinal hernia is present. Bowel sounds are normal.  EXTREMITIES:  Good range of motion, no edema.   DATA REVIEWED:  I reviewed the notes from her primary care office as well as our old office notes    Qianna Clagett J 08/14/2012  CC: Badger, Michael C, MD, BADGER,MICHAEL C, MD        

## 2012-08-18 NOTE — Interval H&P Note (Signed)
History and Physical Interval Note:  08/18/2012 8:51 AM  Hailey Fox  has presented today for surgery, with the diagnosis of left inguinal hernia  The various methods of treatment have been discussed with the patient and family. After consideration of risks, benefits and other options for treatment, the patient has consented to  Procedure(s): LEFT INGUINAL HERNIA REPAIR (Left) as a surgical intervention .  The patient's history has been reviewed, patient examined, no change in status, stable for surgery.  I have reviewed the patient's chart and labs.  Questions were answered to the patient's satisfaction.   I have marked the left inguinal area as the surgical site   [name]

## 2012-08-18 NOTE — Op Note (Signed)
Hailey Fox [facility] [birth year] [id number] 08/14/2012  Preoperative diagnosis: Left inguinal hernia  Postoperative diagnosis: Same, direct  Procedure: Repair with mesh  Surgeon: Currie Paris, MD, FACS  Anesthesia:General  Clinical History and Indications: This patient has a left inguinal hernia that she wishes to have repaired  Description of Procedure:The patient was seen in the holding area and the plans for the procedure as noted above confirmed with the patient. We reviewed again the risks and complications and the patient had no further questions. I then marked the left  as the operative side. This was confirmed with the patient. She wishes to proceed.  The patient was taken to the operating room and after satisfactory general anesthesia was obtained the left  inguinal area was prepped and draped as a sterile field. A time out was done.  I used 0.25% marcaine without epinephrine for local anesthesia and to help with postoperative pain management. The area of the incision was infiltrated first as well as a field block going medially and inferiorly. I also injected some below the external oblique aponeurosis at the level of the anterior superior iliac spine.  An oblique incision was made and deepened to the external oblique aponeurosis. Bleeders were either cauterized or tied with 3-0 Vicryl. The external oblique aponeurosis was opened in the line of its fibers and elevated off of the underlying tissue. The ilioinguinal nerve was noted and protected.  There was a direct defect present. No indirect sac was found and there was no evidence of a femoral hernia. I approximated the transversalis with a running 0 Prolene to keep the hernia reduced  I then took some Bard mesh and cut it to shape. It was anchored at the pubic tubercle and a running 2-0 Prolene used to suture it to the edge of the inferior shelving edge of the external oblique. It was split laterally to go around the nerve and  then laid gently over the internal oblique medially. Several more sutures of 2-0 Prolene were used to anchor the mesh to the internal oblique. The tails of the mesh were crossed lateral to the nerve, leaving the nerve to lie on top of the mesh  This appeared to produce a nice coverage and repair with no tension. There was adequate space for the cord structures to exit through the mesh and deep ring.  I checked to make sure everything was dry. Additional local had been infiltrated as I was working to be sure we had complete anesthesia of the entire operative field.  The incision was then closed with a running 3-0 Vicryl on the external oblique, closing it over the repair. Scarpa's fascia was closed with a running 3-0 Vicryl and the skin with a running 4-0 Monocryl subcuticular and Dermabond on the skin.  The patient tolerated the procedure well. There were no operative complications. There was minimal blood loss. All counts were correct.  Currie Paris, MD, FACS 08/18/2012 10:53 AM

## 2012-08-18 NOTE — Transfer of Care (Signed)
Immediate Anesthesia Transfer of Care Note  Patient: Hailey Fox  Procedure(s) Performed: Procedure(s): LEFT INGUINAL HERNIA REPAIR (Left)  Patient Location: PACU  Anesthesia Type:General  Level of Consciousness: awake, alert  and oriented  Airway & Oxygen Therapy: Patient Spontanous Breathing and Patient connected to nasal cannula oxygen  Post-op Assessment: Report given to PACU RN, Post -op Vital signs reviewed and stable and Patient moving all extremities X 4  Post vital signs: Reviewed and stable  Complications: No apparent anesthesia complications

## 2012-08-19 MED FILL — Mupirocin Oint 2%: CUTANEOUS | Qty: 22 | Status: AC

## 2012-08-20 ENCOUNTER — Encounter (HOSPITAL_COMMUNITY): Payer: Self-pay | Admitting: Surgery

## 2012-08-21 ENCOUNTER — Telehealth (INDEPENDENT_AMBULATORY_CARE_PROVIDER_SITE_OTHER): Payer: Self-pay

## 2012-08-21 NOTE — Telephone Encounter (Signed)
Pt called wanting to know how much swelling she should expect. Pt states she has swelling about 1/4 inch high and goes just above wound and close to pubis. Not much brusing. No redness or heat. Pt advised it is normal to have some swelling after surgery. Pt advised by 3 days out most swelling should have taken place and over the next week or so should gradually begin to resolve. Pt advised if redness,fever,increase in swelling or drainage should occur she needs to call back for recommendation or to be seen. Pt understands.

## 2012-09-01 ENCOUNTER — Encounter (INDEPENDENT_AMBULATORY_CARE_PROVIDER_SITE_OTHER): Payer: Self-pay | Admitting: Surgery

## 2012-09-01 ENCOUNTER — Ambulatory Visit (INDEPENDENT_AMBULATORY_CARE_PROVIDER_SITE_OTHER): Payer: Medicare Other | Admitting: Surgery

## 2012-09-01 VITALS — BP 132/74 | HR 62 | Resp 16 | Ht 63.0 in | Wt 135.6 lb

## 2012-09-01 DIAGNOSIS — Z09 Encounter for follow-up examination after completed treatment for conditions other than malignant neoplasm: Secondary | ICD-10-CM

## 2012-09-01 NOTE — Patient Instructions (Signed)
We will see you again on an as needed basis. Please call the office at [phone] if you have any questions or concerns. Thank you for allowing us to take care of you.  

## 2012-09-01 NOTE — Progress Notes (Signed)
NAME: Hailey Fox                                            DOB: Sep 14, 1940 DATE: 09/01/2012                                                  MRN: [id number]  CC:  Chief Complaint  Patient presents with  . Routine Post Op    1st kpo hernia    HPI: This patient comes in for post op follow-up .Sheunderwent repair of a left inguinal hernia on 08/14/12. She feels that she is doing well.  PE:  VITAL SIGNS: BP 132/74  Pulse 62  Resp 16  Ht 5\' 3"  (1.6 m)  Wt 135 lb 9.6 oz (61.508 kg)  BMI 24.03 kg/m2  General: The patient appears to be healthy, NAD Incision: Healing nicely    IMPRESSION: The patient is doing well S/P [facility] repair.    PLAN: RTC PRN. No lifting for another three weeks

## 2012-09-28 ENCOUNTER — Telehealth (INDEPENDENT_AMBULATORY_CARE_PROVIDER_SITE_OTHER): Payer: Self-pay

## 2012-09-28 ENCOUNTER — Ambulatory Visit (INDEPENDENT_AMBULATORY_CARE_PROVIDER_SITE_OTHER): Payer: Medicare Other

## 2012-09-28 ENCOUNTER — Encounter (INDEPENDENT_AMBULATORY_CARE_PROVIDER_SITE_OTHER): Payer: Self-pay

## 2012-09-28 NOTE — Telephone Encounter (Signed)
Pt called stating feels a retained stitch at incision. She states it pricks her at times. She states wound looks good other wise. Pt to come to office today to have nurse look at area. Pt given appt and Glenda advised.

## 2012-09-28 NOTE — Progress Notes (Unsigned)
Patient arrived for nurse only ;   Abdomen incision site one stitch protruding out. Cleansed area with cholra prep removed superficial stitch with tweezers; cleansed area ;advised patient to call if any concerns or symptoms ; Patient verbalized understanding

## 2012-12-10 ENCOUNTER — Other Ambulatory Visit: Payer: Self-pay | Admitting: Dermatology

## 2012-12-18 NOTE — Progress Notes (Signed)
error 

## 2012-12-18 NOTE — Progress Notes (Signed)
eror

## 2013-11-25 ENCOUNTER — Ambulatory Visit: Payer: Medicare Other | Admitting: Cardiology

## 2014-01-04 ENCOUNTER — Ambulatory Visit: Payer: Medicare Other | Admitting: Cardiology

## 2014-01-05 ENCOUNTER — Ambulatory Visit: Payer: Medicare Other | Admitting: Cardiology

## 2014-04-20 ENCOUNTER — Ambulatory Visit (INDEPENDENT_AMBULATORY_CARE_PROVIDER_SITE_OTHER): Payer: Medicare Other | Admitting: Sports Medicine

## 2014-04-20 ENCOUNTER — Ambulatory Visit
Admission: RE | Admit: 2014-04-20 | Discharge: 2014-04-20 | Disposition: A | Discharge status: Home / Self Care | Payer: Medicare Other | Source: Ambulatory Visit | Attending: Sports Medicine | Admitting: Sports Medicine

## 2014-04-20 ENCOUNTER — Encounter: Payer: Self-pay | Admitting: Sports Medicine

## 2014-04-20 VITALS — BP 128/75 | HR 62 | Ht 62.0 in | Wt 136.0 lb

## 2014-04-20 DIAGNOSIS — M25512 Pain in left shoulder: Secondary | ICD-10-CM

## 2014-04-20 MED ORDER — METHYLPREDNISOLONE ACETATE 40 MG/ML IJ SUSP
40.0000 mg | Freq: Once | INTRAMUSCULAR | Status: AC
  Administered 2014-04-20: 40 mg via INTRA_ARTICULAR

## 2014-04-20 NOTE — Progress Notes (Signed)
Hailey Fox - 74 y.o. female MRN [id number]  Date of birth: [birth year]  SUBJECTIVE: CC: Left shoulder pain, new patient evaluation HPI: Left shoulder pain which has been present for greater than 4 weeks.  No known injury or eliciting event  Describes posterior tightness and radiation to left lateral shoulder. Nothing radiates past the arm.  Pain with overhead motion and putting on her coat.  Taking occasional anti-inflammatories but not regularly scheduled.  Avoiding extension and abduction activities as these cause exquisite 10 out of 10 pain. Otherwise pain is typically 1-2 out of 10.  Occasionally keeps her up at night especially while laying on the side.  Denies numbness, tingling or weakness in the hand or elbow.  No prior trauma.  Left-hand dominant  ROS: per HPI  HISTORY:  Past Medical, Surgical, Social, and Family History reviewed & updated per EMR.  Pertinent Historical Findings include:  reports that she has quit smoking. She does not have any smokeless tobacco history on file. Otherwise relatively healthy,  No prior cardiac, thyroid disease, no diabetes Prior parathyroidectomy and recent inguinal hernia repair 3 prior skin cancers Osteoporosis  OBJECTIVE:  VS:   HT:5\' 2"  (157.5 cm)   WT:136 lb (61.689 kg)  BMI:24.9          BP:128/75 mmHg  HR:62bpm  TEMP: ( )  RESP:   PHYSICAL EXAM:  GENERAL: Adult, thin, Caucasian female. No acute distress PSYCH: Alert and appropriately interactive. VASCULAR: No pitting edema in bilateral upper extremities, radial pulses 2+ out of 4 NEURO: Upper extremity strength is 5+/5 in all myotomes; sensation is intact to light touch in all dermatomes. LEFT SHOULDER: Overall well aligned, no significant deformity.  Minimal pain with axial loading, no significant crepitation with circumduction  External rotation, abduction limited by 5 compared to the right.  ER, IR, abduction strength 5/5  Positive speeds test,  improves with Yergason  Empty can testing intact with minimal pain  Mildly positive Hawkins and Neers  DATA OBTAINED DURING VISIT:   Limited MSK Ultrasound of Left Shoulder: Findings: Biceps Tendon: Significant hypoechoic change, biceps tendon intact, osteophytic spurring along the anterior glenoid Pec Major Insertion: Normal Subscapularis Tendon: Normal insertion, irregular articular cartilage no significant tearing Supraspinatus Tendon: Small full-thickness minimally retracted distal insertional hypoechoic change Infraspinatus/Teres Minor Tendon: Normal AC Joint: Positive mushroom sign Posterior joint capsule showed normal appearing labrum, no significant degenerative changes of the posterior glenoid or humeral head, normal-appearing articular cartilage   Impression: The above findings are consistent with: 1. Degenerative changes of the [facility] Joint 2. 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  Written consent was obtained. After an appropriate time out was taken the left shoulder was sterilely prepped and injected as below: Prep:    Betadine and alcohol,  Ethel chloride.  Approach:  Posterior Needle:  22-gauge, 1/2 inch Meds:   3 mL 1% lidocaine, 1 mL 40 mg Depo-Medrol A bandaid was applied to the area. This procedure was well tolerated and there were no complications.    PLAN: See problem based charting & AVS for additional documentation.  Injection as  above  X-ray to evaluate for underlying degenerative changes,  suspect  HEP: Wagon wheel exercises to 90, 90 with soup can   HEP: Biceps strengthening with eccentric curls, eccentric rotations, eccentric palm up > Return in about 6 weeks (around 06/01/2014), or if symptoms worsen or fail to improve.

## 2014-05-02 ENCOUNTER — Encounter: Payer: Self-pay | Admitting: Sports Medicine

## 2014-05-25 ENCOUNTER — Ambulatory Visit: Payer: Medicare Other | Admitting: Sports Medicine

## 2014-08-29 ENCOUNTER — Other Ambulatory Visit: Payer: Self-pay

## 2015-04-11 ENCOUNTER — Emergency Department (HOSPITAL_COMMUNITY): Payer: Medicare Other

## 2015-04-11 ENCOUNTER — Observation Stay (HOSPITAL_COMMUNITY)
Admission: EM | Admit: 2015-04-11 | Discharge: 2015-04-12 | Disposition: A | Discharge status: Home / Self Care | Payer: Medicare Other | Attending: Internal Medicine | Admitting: Internal Medicine

## 2015-04-11 ENCOUNTER — Encounter (HOSPITAL_COMMUNITY): Payer: Self-pay | Admitting: Emergency Medicine

## 2015-04-11 DIAGNOSIS — Z87891 Personal history of nicotine dependence: Secondary | ICD-10-CM | POA: Diagnosis not present

## 2015-04-11 DIAGNOSIS — I639 Cerebral infarction, unspecified: Secondary | ICD-10-CM

## 2015-04-11 DIAGNOSIS — G454 Transient global amnesia: Principal | ICD-10-CM | POA: Insufficient documentation

## 2015-04-11 DIAGNOSIS — K219 Gastro-esophageal reflux disease without esophagitis: Secondary | ICD-10-CM

## 2015-04-11 DIAGNOSIS — Z791 Long term (current) use of non-steroidal anti-inflammatories (NSAID): Secondary | ICD-10-CM | POA: Diagnosis not present

## 2015-04-11 DIAGNOSIS — Z9109 Other allergy status, other than to drugs and biological substances: Secondary | ICD-10-CM

## 2015-04-11 DIAGNOSIS — Z7982 Long term (current) use of aspirin: Secondary | ICD-10-CM | POA: Insufficient documentation

## 2015-04-11 DIAGNOSIS — R4182 Altered mental status, unspecified: Secondary | ICD-10-CM | POA: Diagnosis present

## 2015-04-11 DIAGNOSIS — Z889 Allergy status to unspecified drugs, medicaments and biological substances status: Secondary | ICD-10-CM

## 2015-04-11 DIAGNOSIS — Z85828 Personal history of other malignant neoplasm of skin: Secondary | ICD-10-CM | POA: Diagnosis not present

## 2015-04-11 LAB — I-STAT CHEM 8, ED
BUN: 16 mg/dL (ref 6–20)
Calcium, Ion: 1.13 mmol/L (ref 1.13–1.30)
Chloride: 101 mmol/L (ref 101–111)
Creatinine, Ser: 0.9 mg/dL (ref 0.44–1.00)
Glucose, Bld: 95 mg/dL (ref 65–99)
HCT: 41 % (ref 36.0–46.0)
Hemoglobin: 13.9 g/dL (ref 12.0–15.0)
Potassium: 4 mmol/L (ref 3.5–5.1)
Sodium: 140 mmol/L (ref 135–145)
TCO2: 28 mmol/L (ref 0–100)

## 2015-04-11 LAB — COMPREHENSIVE METABOLIC PANEL
ALT: 24 U/L (ref 14–54)
AST: 27 U/L (ref 15–41)
Albumin: 4 g/dL (ref 3.5–5.0)
Alkaline Phosphatase: 47 U/L (ref 38–126)
Anion gap: 10 (ref 5–15)
BUN: 12 mg/dL (ref 6–20)
CO2: 29 mmol/L (ref 22–32)
Calcium: 9.7 mg/dL (ref 8.9–10.3)
Chloride: 103 mmol/L (ref 101–111)
Creatinine, Ser: 1.02 mg/dL — ABNORMAL HIGH (ref 0.44–1.00)
GFR calc Af Amer: >60 mL/min (ref >60)
GFR calc non Af Amer: 52 mL/min — ABNORMAL LOW (ref >60)
Glucose, Bld: 105 mg/dL — ABNORMAL HIGH (ref 65–99)
Potassium: 4.4 mmol/L (ref 3.5–5.1)
Sodium: 142 mmol/L (ref 135–145)
Total Bilirubin: 0.8 mg/dL (ref 0.3–1.2)
Total Protein: 7 g/dL (ref 6.5–8.1)

## 2015-04-11 LAB — APTT: aPTT: 32 seconds (ref 24–37)

## 2015-04-11 LAB — CBC
HCT: 39.5 % (ref 36.0–46.0)
Hemoglobin: 13.2 g/dL (ref 12.0–15.0)
MCH: 28.6 pg (ref 26.0–34.0)
MCHC: 33.4 g/dL (ref 30.0–36.0)
MCV: 85.7 fL (ref 78.0–100.0)
Platelets: 221 10*3/uL (ref 150–400)
RBC: 4.61 MIL/uL (ref 3.87–5.11)
RDW: 13.3 % (ref 11.5–15.5)
WBC: 6.3 10*3/uL (ref 4.0–10.5)

## 2015-04-11 LAB — DIFFERENTIAL
Basophils Absolute: 0 10*3/uL (ref 0.0–0.1)
Basophils Relative: 1 %
Eosinophils Absolute: 0.1 10*3/uL (ref 0.0–0.7)
Eosinophils Relative: 1 %
Lymphocytes Relative: 16 %
Lymphs Abs: 1 10*3/uL (ref 0.7–4.0)
Monocytes Absolute: 0.5 10*3/uL (ref 0.1–1.0)
Monocytes Relative: 8 %
Neutro Abs: 4.7 10*3/uL (ref 1.7–7.7)
Neutrophils Relative %: 74 %

## 2015-04-11 LAB — RAPID URINE DRUG SCREEN, HOSP PERFORMED
Amphetamines: NOT DETECTED
Barbiturates: NOT DETECTED
Benzodiazepines: NOT DETECTED
Cocaine: NOT DETECTED
Opiates: NOT DETECTED
Tetrahydrocannabinol: NOT DETECTED

## 2015-04-11 LAB — URINALYSIS, ROUTINE W REFLEX MICROSCOPIC
Bilirubin Urine: NEGATIVE
Glucose, UA: NEGATIVE mg/dL
Hgb urine dipstick: NEGATIVE
Ketones, ur: NEGATIVE mg/dL
Leukocytes, UA: NEGATIVE
Nitrite: NEGATIVE
Protein, ur: NEGATIVE mg/dL
Specific Gravity, Urine: 1.009 (ref 1.005–1.030)
pH: 7 (ref 5.0–8.0)

## 2015-04-11 LAB — I-STAT TROPONIN, ED: Troponin i, poc: 0 ng/mL (ref 0.00–0.08)

## 2015-04-11 LAB — PROTIME-INR
INR: 0.97 (ref 0.00–1.49)
Prothrombin Time: 13.1 seconds (ref 11.6–15.2)

## 2015-04-11 LAB — PHOSPHORUS: Phosphorus: 2.8 mg/dL (ref 2.5–4.6)

## 2015-04-11 LAB — CBG MONITORING, ED: Glucose-Capillary: 80 mg/dL (ref 65–99)

## 2015-04-11 LAB — ETHANOL: Alcohol, Ethyl (B): <5 mg/dL (ref <5)

## 2015-04-11 LAB — MAGNESIUM: Magnesium: 1.9 mg/dL (ref 1.7–2.4)

## 2015-04-11 MED ORDER — SODIUM CHLORIDE 0.9% FLUSH
3.0000 mL | Freq: Two times a day (BID) | INTRAVENOUS | Status: DC
  Administered 2015-04-11: 3 mL via INTRAVENOUS

## 2015-04-11 MED ORDER — IBUPROFEN 400 MG PO TABS
400.0000 mg | ORAL_TABLET | Freq: Once | ORAL | Status: AC
  Administered 2015-04-11: 400 mg via ORAL
  Filled 2015-04-11: qty 1

## 2015-04-11 MED ORDER — CHLORPHENIRAMINE MALEATE 4 MG PO TABS
4.0000 mg | ORAL_TABLET | Freq: Three times a day (TID) | ORAL | Status: DC | PRN
  Filled 2015-04-11: qty 1

## 2015-04-11 MED ORDER — THIAMINE HCL 100 MG/ML IJ SOLN
100.0000 mg | Freq: Once | INTRAMUSCULAR | Status: AC
  Administered 2015-04-11: 100 mg via INTRAVENOUS
  Filled 2015-04-11: qty 2

## 2015-04-11 MED ORDER — ONDANSETRON HCL 4 MG/2ML IJ SOLN: 4.0000 mg | Freq: Four times a day (QID) | INTRAMUSCULAR | Status: DC | PRN

## 2015-04-11 MED ORDER — ENOXAPARIN SODIUM 40 MG/0.4ML ~~LOC~~ SOLN
40.0000 mg | SUBCUTANEOUS | Status: DC
Start: 2015-04-11 — End: 2015-04-12
  Administered 2015-04-11: 40 mg via SUBCUTANEOUS
  Filled 2015-04-11 (×2): qty 0.4

## 2015-04-11 MED ORDER — ACETAMINOPHEN 325 MG PO TABS
650.0000 mg | ORAL_TABLET | Freq: Four times a day (QID) | ORAL | Status: DC | PRN
Start: 2015-04-11 — End: 2015-04-12
  Administered 2015-04-11 – 2015-04-12 (×2): 650 mg via ORAL
  Filled 2015-04-11 (×2): qty 2

## 2015-04-11 MED ORDER — LABETALOL HCL 5 MG/ML IV SOLN
15.0000 mg | Freq: Once | INTRAVENOUS | Status: AC
  Administered 2015-04-11: 15 mg via INTRAVENOUS
  Filled 2015-04-11: qty 4

## 2015-04-11 MED ORDER — ONDANSETRON HCL 4 MG PO TABS: 4.0000 mg | ORAL_TABLET | Freq: Four times a day (QID) | ORAL | Status: DC | PRN

## 2015-04-11 MED ORDER — ASPIRIN EC 81 MG PO TBEC
81.0000 mg | DELAYED_RELEASE_TABLET | Freq: Every day | ORAL | Status: DC
  Administered 2015-04-11 – 2015-04-12 (×2): 81 mg via ORAL
  Filled 2015-04-11 (×2): qty 1

## 2015-04-11 MED ORDER — ACETAMINOPHEN 650 MG RE SUPP: 650.0000 mg | Freq: Four times a day (QID) | RECTAL | Status: DC | PRN

## 2015-04-11 MED ORDER — AZELASTINE HCL 0.1 % NA SOLN
1.0000 | Freq: Two times a day (BID) | NASAL | Status: DC
  Administered 2015-04-11 – 2015-04-12 (×2): 1 via NASAL
  Filled 2015-04-11: qty 30

## 2015-04-11 NOTE — Code Documentation (Signed)
75yo female arriving to [facility] via [facility] at 1250.  Patient was LKW at [place] this morning.  She went to yoga and was transported home by her yoga instructor.  Her husband noticed her to be confused and unable to recall the events of the morning.  Code stroke called on patient arrival for sudden onset confusion.  Patient to CT.  Stroke team to the bedside.  NIHSS 2, see documentation for details and code stroke times.  Patient unable to answer questions about age and month.  Patient repetitively asking the same questions about what happened and how she got here.  Patient is outside the window for treatment with tPA.  Patient to MRI per Dr. Janann Colonel.  MRI DWI negative for acute infarct per Dr. Janann Colonel.  Code stroke canceled.  Patient to have complete MRI per MD.  ED RN [facility] made aware that code stroke was canceled and patient to return to ED via transport after MRI completed.

## 2015-04-11 NOTE — ED Notes (Signed)
Called code stroke @ 13:04pm.

## 2015-04-11 NOTE — Consult Note (Addendum)
Requesting Physician: Dr. Lonni Fix    Chief Complaint: Confusion  HPI:                                                                                                                                         Hailey Fox is an 75 y.o. female who was normal at 18 and was signing papers that were emotional to her. She went to meditation and then returned home around 11.  Teacher drove her back from Yoga and noted she was acting strange. She was noted to repeat "how did I get here and where is [name]" over and over. NIHSS 1 for confusion. Patient taken to CT and then MRI after CT was negative. While waiting for MRI she noted a slight HA.    Past Medical History  Diagnosis Date  . Osteoporosis   . Allergy   . Complication of anesthesia     slow to wake up  . PONV (postoperative nausea and vomiting)   . Dysrhythmia     PAC's   . GERD (gastroesophageal reflux disease)   . Cancer ([place])     3 skin cancers removed. 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 Arthritis   . Cataract     bil    Past Surgical History  Procedure Laterality Date  . Tubal ligation  early 80's  . Appendectomy  early 81's  . Tonsillectomy  1945  . Breast surgery Right 2003    palpiloma  . Parathyroidectomy  [id number]  . Inguinal hernia repair Left 08/18/2012    Procedure: LEFT INGUINAL HERNIA REPAIR;  Surgeon: Haywood Lasso, MD;  Location: [facility];  Service: General;  Laterality: Left;  . Hernia repair      Family History  Problem Relation Age of Onset  . Cancer Paternal Aunt     breast   Social History:  reports that she has quit smoking. She does not have any smokeless tobacco history on file. She reports that she drinks about 1.8 oz of alcohol per week. She reports that she does not use illicit drugs.  Allergies:  Allergies  Allergen Reactions  . Codeine     blackouts  . Contrast Media [Iodinated Diagnostic Agents] Hives  . Macrodantin [Nitrofurantoin Macrocrystal]   . Ciprofloxacin     Rapid pulse  . Iohexol       Desc: IMMEDIATE DEVELOPMENT OF HIVES POST INJECTION     Medications:  No current facility-administered medications for this encounter.   Current Outpatient Prescriptions  Medication Sig Dispense Refill  . aspirin 81 MG tablet Take 81 mg by mouth daily.    Marland Kitchen azelastine (ASTELIN) 137 MCG/SPRAY nasal spray Place 1 spray into the nose 2 (two) times daily.     . chlorpheniramine (CHLOR-TRIMETON) 4 MG tablet Take 4 mg by mouth 2 (two) times daily as needed for allergies.    . Cholecalciferol (VITAMIN D-3 PO) Take 2,000 mg by mouth daily.    . Coenzyme Q10-Levocarnitine 100-20 MG CAPS Take by mouth.    Marland Kitchen HYDROcodone-acetaminophen (NORCO) 5-325 MG per tablet Take 1 tablet by mouth every 4 (four) hours as needed for pain. (Patient not taking: Reported on 04/20/2014) 30 tablet 0  . ibuprofen (ADVIL,MOTRIN) 200 MG tablet Take 200 mg by mouth daily.    . Meth-Hyo-M Bl-Na Phos-Ph Sal (URIBEL) 118 MG CAPS Take 1 capsule up to 4 times daily as needed.    Marland Kitchen omega-3 acid ethyl esters (LOVAZA) 1 G capsule Take 1 g by mouth 2 (two) times daily.    . Psyllium (FIBER) 0.52 G CAPS Take by mouth.    . Red Yeast Rice Extract 600 MG CAPS Take by mouth.       ROS:                                                                                                                                       History obtained from the patient  General ROS: negative for - chills, fatigue, fever, night sweats, weight gain or weight loss Psychological ROS: negative for - behavioral disorder, hallucinations, memory difficulties, mood swings or suicidal ideation Ophthalmic ROS: negative for - blurry vision, double vision, eye pain or loss of vision ENT ROS: negative for - epistaxis, nasal discharge, oral lesions, sore throat, tinnitus or vertigo Allergy and Immunology ROS: negative for - hives or  itchy/watery eyes Hematological and Lymphatic ROS: negative for - bleeding problems, bruising or swollen lymph nodes Endocrine ROS: negative for - galactorrhea, hair pattern changes, polydipsia/polyuria or temperature intolerance Respiratory ROS: negative for - cough, hemoptysis, shortness of breath or wheezing Cardiovascular ROS: negative for - chest pain, dyspnea on exertion, edema or irregular heartbeat Gastrointestinal ROS: negative for - abdominal pain, diarrhea, hematemesis, nausea/vomiting or stool incontinence Genito-Urinary ROS: negative for - dysuria, hematuria, incontinence or urinary frequency/urgency Musculoskeletal ROS: negative for - joint swelling or muscular weakness Neurological ROS: as noted in HPI Dermatological ROS: negative for rash and skin lesion changes  Neurologic Examination:  Blood pressure 191/80, temperature 98.8 F (37.1 C), temperature source Oral, resp. rate 15, SpO2 100 %.  HEENT-  Normocephalic, no lesions, without obvious abnormality.  Normal external eye and conjunctiva.  Normal TM's bilaterally.  Normal auditory canals and external ears. Normal external nose, mucus membranes and septum.  Normal pharynx. Cardiovascular- S1, S2 normal, pulses palpable throughout   Lungs- chest clear, no wheezing, rales, normal symmetric air entry Abdomen- normal findings: bowel sounds normal Extremities- no clubbing Lymph-no adenopathy palpable Musculoskeletal-no joint tenderness, deformity or swelling Skin-warm and dry, no hyperpigmentation, vitiligo, or suspicious lesions  Neurological Examination Mental Status: Alert, oriented, thought content appropriate.  Speech fluent without evidence of aphasia.  Able to follow 3 step commands without difficulty. Cranial Nerves: II: Discs flat bilaterally; Visual fields grossly normal, pupils equal, round, reactive to light and  accommodation III,IV, VI: ptosis not present, extra-ocular motions intact bilaterally V,VII: smile symmetric, facial light touch sensation normal bilaterally VIII: hearing normal bilaterally IX,X: uvula rises symmetrically XI: bilateral shoulder shrug XII: midline tongue extension Motor: Right : Upper extremity   5/5    Left:     Upper extremity   5/5  Lower extremity   5/5     Lower extremity   5/5 Tone and bulk:normal tone throughout; no atrophy noted Sensory: Pinprick and light touch intact throughout, bilaterally Deep Tendon Reflexes: 2+ and symmetric throughout Plantars: Right: downgoing   Left: downgoing Cerebellar: normal finger-to-nose, normal rapid alternating movements and normal heel-to-shin test Gait: normal gait and station       Lab Results: Basic Metabolic Panel: No results for input(s): NA, K, CL, CO2, GLUCOSE, BUN, CREATININE, CALCIUM, MG, PHOS in the last 168 hours.  Liver Function Tests: No results for input(s): AST, ALT, ALKPHOS, BILITOT, PROT, ALBUMIN in the last 168 hours. No results for input(s): LIPASE, AMYLASE in the last 168 hours. No results for input(s): AMMONIA in the last 168 hours.  CBC: No results for input(s): WBC, NEUTROABS, HGB, HCT, MCV, PLT in the last 168 hours.  Cardiac Enzymes: No results for input(s): CKTOTAL, CKMB, CKMBINDEX, TROPONINI in the last 168 hours.  Lipid Panel: No results for input(s): CHOL, TRIG, HDL, CHOLHDL, VLDL, LDLCALC in the last 168 hours.  CBG: No results for input(s): GLUCAP in the last 168 hours.  Microbiology: Results for orders placed or performed during the hospital encounter of 08/18/12  Surgical pcr screen     Status: None   Collection Time: 08/18/12  7:19 AM  Result Value Ref Range Status   MRSA, PCR NEGATIVE NEGATIVE Final   Staphylococcus aureus NEGATIVE NEGATIVE Final    Comment:        The Xpert SA Assay (FDA approved for NASAL specimens in patients over 50 years of age), is one component  of a comprehensive surveillance program.  Test performance has been validated by [employer] for patients greater than or equal to 40 year old. It is not intended to diagnose infection nor to guide or monitor treatment.    Coagulation Studies: No results for input(s): LABPROT, INR in the last 72 hours.  Imaging: No results found.     Assessment and plan discussed with with attending physician and they are in agreement.    Etta Quill PA-C [facility] [phone]  04/11/2015, 1:09 PM   Assessment: 75 y.o. female with sudden onset confusion and repeating the same sentence. LSN 0845. At this point this is more characteristic of TGA other than CVA or seizure. That said her BP was  elevated and cannot rule out CVA vs possible PRES. No signs of infectious etiology.  Stroke Risk Factors - none  Recommend: 1) MRI brain 2) EEG 3) Favor aggressive treatment of blood pressure if MRI negative for acute stroke 4) Would monitor in the ED, if no return to baseline may need admission for further observation  Jim Like, DO [facility] [phone]  If 7pm- 7am, please page neurology on call as listed in [facility].

## 2015-04-11 NOTE — Progress Notes (Signed)
Patient admitted to room 5M05 at this time. Alert and in stable condition.

## 2015-04-11 NOTE — ED Notes (Signed)
Pt transported to MRI 

## 2015-04-11 NOTE — H&P (Signed)
[facility] History and Physical  Hailey Fox [id number] DOB: [birth year] DOA: 04/11/2015  Referring physician: Dr Melford Aase - [facility] PCP: Chesley Noon, MD   Chief Complaint: forgetfulness.  HPI: Hailey Fox is a 75 y.o. female  Forgetfulness. Patient presenting with confusion over events that occurred earlier in the day. History provided by patient, EDP, patient's husband and family friends. Patient states that there is a gap of time in her memory today. Prior to presentation to the emergency room the patient's last memory was heading out the door on her way to her yoga class. After that her next memory is going into the MRI machine [facility] at [facility]. Since that time her memory is complete and intact. Patient denies any recent medication changes, change in diet, appetite, fevers, nausea, vomiting, diarrhea, URI symptoms, headaches, dizziness, falls, LOC, loss of strength in any single extremity, slurred speech, facial weakness or numbness. Husband states that patient was normal throughout the morning and when she left to her yoga class. When patient returned from her yoga class she is acting very on. Patient continued to ask repetitive questions, disoriented with regards to time in her situation and date. Patient was unable to recall her past medical history and had a persistent fixation on the scar on her neck which was from previous thyroid surgery that she has had for several years.  Currently patient also complaining of nasal congestion which is normal for her and her significant history with allergies.   Review of Systems:  Constitutional:  No weight loss, night sweats, Fevers, chills, fatigue.  HEENT:  No headaches, Difficulty swallowing,Tooth/dental problems,Sore throat, Cardio-vascular:  No chest pain, Orthopnea, PND, swelling in lower extremities, anasarca, dizziness, palpitations  GI:  No heartburn, indigestion, abdominal pain, nausea, vomiting,  diarrhea, change in bowel habits, loss of appetite  Resp:   No shortness of breath with exertion or at rest. No excess mucus, no productive cough, No non-productive cough, No coughing up of blood.No change in color of mucus.No wheezing.No chest wall deformity  Skin:  no rash or lesions.  GU:  no dysuria, change in color of urine, no urgency or frequency. No flank pain.  Musculoskeletal:   No joint pain or swelling. No decreased range of motion. No back pain.  Psych: Per HPI Neuro:  No change in sensation, unilateral strength, or cognitive abilities  All other systems were reviewed and are negative.  Past Medical History  Diagnosis Date  . Osteoporosis   . Allergy   . Complication of anesthesia     slow to wake up  . PONV (postoperative nausea and vomiting)   . Dysrhythmia     PAC's   . GERD (gastroesophageal reflux disease)   . Cancer ([place])     3 skin cancers removed. 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 Arthritis   . Cataract     bil   Past Surgical History  Procedure Laterality Date  . Tubal ligation  early 80's  . Appendectomy  early 17's  . Tonsillectomy  1945  . Breast surgery Right 2003    palpiloma  . Parathyroidectomy  [id number]  . Inguinal hernia repair Left 08/18/2012    Procedure: LEFT INGUINAL HERNIA REPAIR;  Surgeon: Haywood Lasso, MD;  Location: [facility];  Service: General;  Laterality: Left;  . Hernia repair     Social History:  reports that she has quit smoking. She does not have any smokeless tobacco history on file. She reports that she  drinks about 1.8 oz of alcohol per week. She reports that she does not use illicit drugs.  Allergies  Allergen Reactions  . Codeine     blackouts  . Contrast Media [Iodinated Diagnostic Agents] Hives  . Macrodantin [Nitrofurantoin Macrocrystal]   . Ciprofloxacin     Rapid pulse  . Iohexol      Desc: IMMEDIATE DEVELOPMENT OF HIVES POST INJECTION     Family History  Problem Relation Age of Onset  . Cancer Paternal Aunt       breast     Prior to Admission medications   Medication Sig Start Date End Date Taking? Authorizing Provider  aspirin 81 MG tablet Take 81 mg by mouth daily.    Historical Provider, MD  azelastine (ASTELIN) 137 MCG/SPRAY nasal spray Place 1 spray into the nose 2 (two) times daily.  05/04/12   Historical Provider, MD  chlorpheniramine (CHLOR-TRIMETON) 4 MG tablet Take 4 mg by mouth 2 (two) times daily as needed for allergies.    Historical Provider, MD  Cholecalciferol (VITAMIN D-3 PO) Take 2,000 mg by mouth daily.    Historical Provider, MD  Coenzyme Q10-Levocarnitine 100-20 MG CAPS Take by mouth.    Historical Provider, MD  HYDROcodone-acetaminophen (NORCO) 5-325 MG per tablet Take 1 tablet by mouth every 4 (four) hours as needed for pain. Patient not taking: Reported on 04/20/2014 08/18/12   Neldon Mc, MD  ibuprofen (ADVIL,MOTRIN) 200 MG tablet Take 200 mg by mouth daily.    Historical Provider, MD  Meth-Hyo-M Bl-Na Phos-Ph Sal (URIBEL) 118 MG CAPS Take 1 capsule up to 4 times daily as needed. 03/28/14   Historical Provider, MD  omega-3 acid ethyl esters (LOVAZA) 1 G capsule Take 1 g by mouth 2 (two) times daily.    Historical Provider, MD  Psyllium (FIBER) 0.52 G CAPS Take by mouth.    Historical Provider, MD  Red Yeast Rice Extract 600 MG CAPS Take by mouth.    Historical Provider, MD   Physical Exam: Filed Vitals:   04/11/15 1316 04/11/15 1515 04/11/15 1530 04/11/15 1600  BP:  127/71 142/70 108/91  Pulse:  66 58 59  Temp:      TempSrc:      Resp:  20 14 13   Height: 5\' 2"  (1.575 m)     Weight: 69.1 kg (152 lb 5.4 oz)     SpO2:  99% 98% 99%    Wt Readings from Last 3 Encounters:  04/11/15 69.1 kg (152 lb 5.4 oz)  04/20/14 61.689 kg (136 lb)  09/01/12 61.508 kg (135 lb 9.6 oz)    General:  Appears calm and comfortable Eyes:  PERRL, EOMI, normal lids, iris ENT:  grossly normal hearing, lips & tongue Neck:  no LAD, masses or thyromegaly Cardiovascular:  RRR, II/VI  systolic murmur, trace LE edema bilat Respiratory:  CTA bilaterally, no w/r/r. Normal respiratory effort. Abdomen:  soft, ntnd Skin:  no rash or induration seen on limited exam Musculoskeletal:  grossly normal tone BUE/BLE Psychiatric:  grossly normal mood and affect, speech fluent and appropriate Neurologic:  CN 2-12 grossly intact, moves all extremities in coordinated fashion. Sits unassisted.           Labs on Admission:  Basic Metabolic Panel:  Recent Labs Lab 04/11/15 1307 04/11/15 1317  NA 142 140  K 4.4 4.0  CL 103 101  CO2 29  --   GLUCOSE 105* 95  BUN 12 16  CREATININE 1.02* 0.90  CALCIUM 9.7  --  Liver Function Tests:  Recent Labs Lab 04/11/15 1307  AST 27  ALT 24  ALKPHOS 47  BILITOT 0.8  PROT 7.0  ALBUMIN 4.0   No results for input(s): LIPASE, AMYLASE in the last 168 hours. No results for input(s): AMMONIA in the last 168 hours. CBC:  Recent Labs Lab 04/11/15 1307 04/11/15 1317  WBC 6.3  --   NEUTROABS 4.7  --   HGB 13.2 13.9  HCT 39.5 41.0  MCV 85.7  --   PLT 221  --    Cardiac Enzymes: No results for input(s): CKTOTAL, CKMB, CKMBINDEX, TROPONINI in the last 168 hours.  BNP (last 3 results) No results for input(s): BNP in the last 8760 hours.  ProBNP (last 3 results) No results for input(s): PROBNP in the last 8760 hours.   CREATININE: 0.9 (04/11/15 1317) Estimated creatinine clearance - 49.2 mL/min  CBG:  Recent Labs Lab 04/11/15 1308  GLUCAP 80    Radiological Exams on Admission: Ct Head Wo Contrast  04/11/2015  ADDENDUM REPORT: 04/11/2015 13:47 ADDENDUM: Critical Value/emergent results were called by telephone at the time of interpretation on 04/11/2015 at 1:40 pm to Dr. Janann Colonel, neurology, who verbally acknowledged these results. Electronically Signed   By: Lowella Grip III M.D.   On: 04/11/2015 13:47  04/11/2015  CLINICAL DATA:  Acute onset memory loss/amnesia EXAM: CT HEAD WITHOUT CONTRAST TECHNIQUE: Contiguous axial  images were obtained from the base of the skull through the vertex without intravenous contrast. COMPARISON:  April 15, 2006 FINDINGS: There is moderate diffuse atrophy. There is no intracranial mass, hemorrhage, extra-axial fluid collection, or midline shift. On axial slice 8 series [id number], there is subtle decreased attenuation in the mid posterior left cerebellum measuring 1.3 x 0.5 cm. This area may represent a small early left cerebellar infarct. Elsewhere, there is minimal periventricular small vessel disease. Gray-white compartments otherwise appear normal. The middle cerebral artery vascular attenuation appear symmetric bilaterally. Bony calvarium appears intact. The mastoid air cells are clear. Orbits appear symmetric bilaterally. IMPRESSION: Atrophy with rather minimal periventricular small vessel disease. Question small early infarct left posterior mid cerebellum. Elsewhere gray-white compartments appear normal. No hemorrhage or mass effect. Electronically Signed: By: Lowella Grip III M.D. On: 04/11/2015 13:27   Mr Jodene Nam Head Wo Contrast  04/11/2015  CLINICAL DATA:  Stroke.  Confusion. EXAM: MRI HEAD WITHOUT CONTRAST MRA HEAD WITHOUT CONTRAST TECHNIQUE: Multiplanar, multiecho pulse sequences of the brain and surrounding structures were obtained without intravenous contrast. Angiographic images of the head were obtained using MRA technique without contrast. COMPARISON:  CT head 04/11/2015 FINDINGS: MRI HEAD FINDINGS Negative for acute infarct. Moderate atrophy. Mild ventricular enlargement consistent with atrophy. Mild chronic microvascular ischemic changes in the white matter. Small chronic infarct left pons. No cerebellar infarct. Negative for intracranial hemorrhage or fluid collection. No mass or edema. No shift of the midline structures. Paranasal sinuses clear. No orbital lesion. Bilateral lens replacement. Pituitary not enlarged. Normal calvarium. MRA HEAD FINDINGS Both vertebral arteries patent  to the basilar. Right PICA patent. Left PICA not visualized. AICA, superior cerebellar, posterior cerebral arteries widely patent Internal carotid artery widely patent bilaterally without stenosis or aneurysm. Anterior and middle cerebral arteries appear normal without stenosis. No aneurysm. IMPRESSION: Atrophy and mild chronic microvascular ischemia.  No acute infarct Negative MRA head Electronically Signed   By: Franchot Gallo M.D.   On: 04/11/2015 14:41   Mr Brain Wo Contrast  04/11/2015  CLINICAL DATA:  Stroke.  Confusion. EXAM: MRI HEAD WITHOUT  CONTRAST MRA HEAD WITHOUT CONTRAST TECHNIQUE: Multiplanar, multiecho pulse sequences of the brain and surrounding structures were obtained without intravenous contrast. Angiographic images of the head were obtained using MRA technique without contrast. COMPARISON:  CT head 04/11/2015 FINDINGS: MRI HEAD FINDINGS Negative for acute infarct. Moderate atrophy. Mild ventricular enlargement consistent with atrophy. Mild chronic microvascular ischemic changes in the white matter. Small chronic infarct left pons. No cerebellar infarct. Negative for intracranial hemorrhage or fluid collection. No mass or edema. No shift of the midline structures. Paranasal sinuses clear. No orbital lesion. Bilateral lens replacement. Pituitary not enlarged. Normal calvarium. MRA HEAD FINDINGS Both vertebral arteries patent to the basilar. Right PICA patent. Left PICA not visualized. AICA, superior cerebellar, posterior cerebral arteries widely patent Internal carotid artery widely patent bilaterally without stenosis or aneurysm. Anterior and middle cerebral arteries appear normal without stenosis. No aneurysm. IMPRESSION: Atrophy and mild chronic microvascular ischemia.  No acute infarct Negative MRA head Electronically Signed   By: Franchot Gallo M.D.   On: 04/11/2015 14:41      Assessment/Plan Active Problems:   Transient global amnesia   Multiple allergies   GERD (gastroesophageal  reflux disease)   Amnesia: initial concern for stroke given CT results (though cerebellar etiology is not consistent w/ memory deficit. MRI/MRA w/o evidence of acute intracranial process. Pt evaluated by Neurology who recommends admission and workup. Suspect transient global amnesia from organic causes vs psychosomatic. No direct evidence of seizure, infectious etiology, metabolic derangement, medication induced symptomatology.  - Telemetry - Obs - Follow neuro recommendations - EEG - Neuro checks - UA pending - Speech eval  Allergies: chronic issue for pt. Tried multiple various therapies in past w/o benefit.  - continue home Astelin and chlorpheniramine.  - Consider DC chlorpheniramine due to anticholinergic effects if not improving  GERD: - PPI  Code Status: FULL  DVT Prophylaxis: Lovenox Family Communication: husband and family friends Disposition Plan: Pending Improvement    MERRELL, DAVID J, MD Family [facility] [email or web address] Password [email or web address]

## 2015-04-11 NOTE — ED Notes (Signed)
Code stroke canceled  

## 2015-04-11 NOTE — ED Notes (Signed)
Code stroke activated ?

## 2015-04-11 NOTE — ED Notes (Signed)
Attempted report x1. 

## 2015-04-11 NOTE — ED Notes (Signed)
Pt passed swallow screen, pt able to eat lunch per MD.

## 2015-04-11 NOTE — ED Notes (Signed)
Attempted report x 2 

## 2015-04-11 NOTE — ED Notes (Signed)
Pt left for yoga at 0930 this morning. When pt returned, pt was confused, did not remember participating in yoga and did not remember getting home. Denies HA/N/V. Pt normally Alert and oriented. Pt does not remember date, alert to self, alter to husband, not alert to situation. BP 148/72, HR 68, resp 16, 98% room air. CBG 98

## 2015-04-11 NOTE — ED Notes (Signed)
Gave pt [place] sandwich and crackers

## 2015-04-11 NOTE — ED Provider Notes (Signed)
CSN: [id number]     Arrival date & time 04/11/15  1250 History   First MD Initiated Contact with Patient 04/11/15 1255     Chief Complaint  Patient presents with  . Altered Mental Status     (Consider location/radiation/quality/duration/timing/severity/associated sxs/prior Treatment) HPI  75 year old female with confusion. Last seen normal at approximately 9:30 AM this morning. She returned home from a yoga class when husband noted that she was confused. She has repetitive questioning. Disoriented to time and situation. Could not tell me year, month or day of week. Could not recall any of her past medical history or medication. Questioned her about surgical scar on her neck and she seemed unaware that she even had one.  She feels like she is confused but is not sure why. She has no other complaints. Denies any acute pain. No acute visual changes. No acute numbness, tingling or focal loss of strength. No respiratory complaints. Denies any ingestion. Denies any trauma.  Past Medical History  Diagnosis Date  . Osteoporosis   . Allergy   . Complication of anesthesia     slow to wake up  . PONV (postoperative nausea and vomiting)   . Dysrhythmia     PAC's   . GERD (gastroesophageal reflux disease)   . Cancer ([place])     3 skin cancers removed. 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 Arthritis   . Cataract     bil   Past Surgical History  Procedure Laterality Date  . Tubal ligation  early 80's  . Appendectomy  early 55's  . Tonsillectomy  1945  . Breast surgery Right 2003    palpiloma  . Parathyroidectomy  [id number]  . Inguinal hernia repair Left 08/18/2012    Procedure: LEFT INGUINAL HERNIA REPAIR;  Surgeon: Haywood Lasso, MD;  Location: [facility];  Service: General;  Laterality: Left;  . Hernia repair     Family History  Problem Relation Age of Onset  . Cancer Paternal Aunt     breast   Social History  Substance Use Topics  . Smoking status: Former Smoker -- 15 years  . Smokeless  tobacco: None     Comment: quit smoking 1970's  . Alcohol Use: 1.8 oz/week    3 Glasses of wine per week     Comment: rare   OB History    No data available     Review of Systems  All systems reviewed and negative, other than as noted in HPI.   Allergies  Codeine; Contrast media; Macrodantin; Ciprofloxacin; and Iohexol  Home Medications   Prior to Admission medications   Medication Sig Start Date End Date Taking? Authorizing Provider  aspirin 81 MG tablet Take 81 mg by mouth daily.    Historical Provider, MD  azelastine (ASTELIN) 137 MCG/SPRAY nasal spray Place 1 spray into the nose 2 (two) times daily.  05/04/12   Historical Provider, MD  chlorpheniramine (CHLOR-TRIMETON) 4 MG tablet Take 4 mg by mouth 2 (two) times daily as needed for allergies.    Historical Provider, MD  Cholecalciferol (VITAMIN D-3 PO) Take 2,000 mg by mouth daily.    Historical Provider, MD  Coenzyme Q10-Levocarnitine 100-20 MG CAPS Take by mouth.    Historical Provider, MD  HYDROcodone-acetaminophen (NORCO) 5-325 MG per tablet Take 1 tablet by mouth every 4 (four) hours as needed for pain. Patient not taking: Reported on 04/20/2014 08/18/12   Neldon Mc, MD  ibuprofen (ADVIL,MOTRIN) 200 MG tablet Take 200 mg by mouth  daily.    Historical Provider, MD  Meth-Hyo-M Bl-Na Phos-Ph Sal (URIBEL) 118 MG CAPS Take 1 capsule up to 4 times daily as needed. 03/28/14   Historical Provider, MD  omega-3 acid ethyl esters (LOVAZA) 1 G capsule Take 1 g by mouth 2 (two) times daily.    Historical Provider, MD  Psyllium (FIBER) 0.52 G CAPS Take by mouth.    Historical Provider, MD  Red Yeast Rice Extract 600 MG CAPS Take by mouth.    Historical Provider, MD   BP 191/80 mmHg  Temp(Src) 98.8 F (37.1 C) (Oral)  Resp 15  SpO2 100% Physical Exam  Constitutional: She appears well-developed and well-nourished. No distress.  HENT:  Head: Normocephalic and atraumatic.  Eyes: Conjunctivae and EOM are normal. Pupils are equal,  round, and reactive to light. Right eye exhibits no discharge. Left eye exhibits no discharge.  Neck: Neck supple.  Cardiovascular: Normal rate, regular rhythm and normal heart sounds.  Exam reveals no gallop and no friction rub.   No murmur heard. Pulmonary/Chest: Effort normal and breath sounds normal. No respiratory distress.  Abdominal: Soft. She exhibits no distension. There is no tenderness.  Musculoskeletal: She exhibits no edema or tenderness.  Neurological: She is alert. No cranial nerve deficit. She exhibits normal muscle tone. Coordination normal.  Speech is clear. Oriented to person. Could not recall year, month or day of the week. Initially did not seem aware that she was in the emergency room but was able to recall this later after she was oriented by staff. Cranial nerves II through XII are intact. Strength is 5 out of 5 bilateral upper lower extremities. Visual fields are intact to confrontation. Sensation is intact to light touch. Good finger to nose testing bilaterally. Did not ambulate.  Skin: Skin is warm and dry.  Psychiatric: She has a normal mood and affect. Her behavior is normal. Thought content normal.  Nursing note and vitals reviewed.   ED Course  Procedures (including critical care time) Labs Review Labs Reviewed  COMPREHENSIVE METABOLIC PANEL - Abnormal; Notable for the following:    Glucose, Bld 105 (*)    Creatinine, Ser 1.02 (*)    GFR calc non Af Amer 52 (*)    All other components within normal limits  ETHANOL  PROTIME-INR  APTT  CBC  DIFFERENTIAL  URINE RAPID DRUG SCREEN, HOSP PERFORMED  URINALYSIS, ROUTINE W REFLEX MICROSCOPIC (NOT AT [facility])  MAGNESIUM  PHOSPHORUS  CBC  TROPONIN I  I-STAT CHEM 8, ED  I-STAT TROPOININ, ED  CBG MONITORING, ED    Imaging Review Ct Head Wo Contrast  04/11/2015  ADDENDUM REPORT: 04/11/2015 13:47 ADDENDUM: Critical Value/emergent results were called by telephone at the time of interpretation on 04/11/2015 at 1:40 pm  to Dr. Janann Colonel, neurology, who verbally acknowledged these results. Electronically Signed   By: Lowella Grip III M.D.   On: 04/11/2015 13:47  04/11/2015  CLINICAL DATA:  Acute onset memory loss/amnesia EXAM: CT HEAD WITHOUT CONTRAST TECHNIQUE: Contiguous axial images were obtained from the base of the skull through the vertex without intravenous contrast. COMPARISON:  April 15, 2006 FINDINGS: There is moderate diffuse atrophy. There is no intracranial mass, hemorrhage, extra-axial fluid collection, or midline shift. On axial slice 8 series 123456, there is subtle decreased attenuation in the mid posterior left cerebellum measuring 1.3 x 0.5 cm. This area may represent a small early left cerebellar infarct. Elsewhere, there is minimal periventricular small vessel disease. Gray-white compartments otherwise appear normal. The middle cerebral  artery vascular attenuation appear symmetric bilaterally. Bony calvarium appears intact. The mastoid air cells are clear. Orbits appear symmetric bilaterally. IMPRESSION: Atrophy with rather minimal periventricular small vessel disease. Question small early infarct left posterior mid cerebellum. Elsewhere gray-white compartments appear normal. No hemorrhage or mass effect. Electronically Signed: By: Lowella Grip III M.D. On: 04/11/2015 13:27   Mr Jodene Nam Head Wo Contrast  04/11/2015  CLINICAL DATA:  Stroke.  Confusion. EXAM: MRI HEAD WITHOUT CONTRAST MRA HEAD WITHOUT CONTRAST TECHNIQUE: Multiplanar, multiecho pulse sequences of the brain and surrounding structures were obtained without intravenous contrast. Angiographic images of the head were obtained using MRA technique without contrast. COMPARISON:  CT head 04/11/2015 FINDINGS: MRI HEAD FINDINGS Negative for acute infarct. Moderate atrophy. Mild ventricular enlargement consistent with atrophy. Mild chronic microvascular ischemic changes in the white matter. Small chronic infarct left pons. No cerebellar infarct. Negative  for intracranial hemorrhage or fluid collection. No mass or edema. No shift of the midline structures. Paranasal sinuses clear. No orbital lesion. Bilateral lens replacement. Pituitary not enlarged. Normal calvarium. MRA HEAD FINDINGS Both vertebral arteries patent to the basilar. Right PICA patent. Left PICA not visualized. AICA, superior cerebellar, posterior cerebral arteries widely patent Internal carotid artery widely patent bilaterally without stenosis or aneurysm. Anterior and middle cerebral arteries appear normal without stenosis. No aneurysm. IMPRESSION: Atrophy and mild chronic microvascular ischemia.  No acute infarct Negative MRA head Electronically Signed   By: Franchot Gallo M.D.   On: 04/11/2015 14:41   Mr Brain Wo Contrast  04/11/2015  CLINICAL DATA:  Stroke.  Confusion. EXAM: MRI HEAD WITHOUT CONTRAST MRA HEAD WITHOUT CONTRAST TECHNIQUE: Multiplanar, multiecho pulse sequences of the brain and surrounding structures were obtained without intravenous contrast. Angiographic images of the head were obtained using MRA technique without contrast. COMPARISON:  CT head 04/11/2015 FINDINGS: MRI HEAD FINDINGS Negative for acute infarct. Moderate atrophy. Mild ventricular enlargement consistent with atrophy. Mild chronic microvascular ischemic changes in the white matter. Small chronic infarct left pons. No cerebellar infarct. Negative for intracranial hemorrhage or fluid collection. No mass or edema. No shift of the midline structures. Paranasal sinuses clear. No orbital lesion. Bilateral lens replacement. Pituitary not enlarged. Normal calvarium. MRA HEAD FINDINGS Both vertebral arteries patent to the basilar. Right PICA patent. Left PICA not visualized. AICA, superior cerebellar, posterior cerebral arteries widely patent Internal carotid artery widely patent bilaterally without stenosis or aneurysm. Anterior and middle cerebral arteries appear normal without stenosis. No aneurysm. IMPRESSION: Atrophy and  mild chronic microvascular ischemia.  No acute infarct Negative MRA head Electronically Signed   By: Franchot Gallo M.D.   On: 04/11/2015 14:41   I have personally reviewed and evaluated these images and lab results as part of my medical decision-making.   EKG Interpretation   Date/Time:  Tuesday April 11 2015 12:57:13 EST Ventricular Rate:  73 PR Interval:  52 QRS Duration: 108 QT Interval:  422 QTC Calculation: 465 R Axis:   38 Text Interpretation:  Sinus rhythm Multiple premature complexes, vent &  supraven Confirmed by Wilson Singer  MD, Arianie Couse ([id number]) on 04/11/2015 2:26:48 PM      MDM   Final diagnoses:  Transient global amnesia    74yF with confusion. Has a window of a couple hours that has cannot recall. After brief discussion with neurology, made "Code Stroke" which was subsequently canceled.  No deficits aside from confusion. Initial work-up thus unremarkable. Imaging negative. EEG pending. BP has been trending down but still elevated and is complaining of a  mild headache. Her memory is improving.  On first repeat evaluation after CT scan she did not    Virgel Manifold, MD 04/12/15 [phone]

## 2015-04-12 ENCOUNTER — Observation Stay (HOSPITAL_BASED_OUTPATIENT_CLINIC_OR_DEPARTMENT_OTHER)
Admit: 2015-04-12 | Discharge: 2015-04-12 | Disposition: A | Discharge status: Home / Self Care | Payer: Medicare Other | Attending: Family Medicine | Admitting: Family Medicine

## 2015-04-12 DIAGNOSIS — G454 Transient global amnesia: Secondary | ICD-10-CM | POA: Diagnosis not present

## 2015-04-12 DIAGNOSIS — R4182 Altered mental status, unspecified: Secondary | ICD-10-CM

## 2015-04-12 DIAGNOSIS — K219 Gastro-esophageal reflux disease without esophagitis: Secondary | ICD-10-CM | POA: Diagnosis not present

## 2015-04-12 DIAGNOSIS — Z9109 Other allergy status, other than to drugs and biological substances: Secondary | ICD-10-CM | POA: Diagnosis not present

## 2015-04-12 LAB — CBC
HEMATOCRIT: 37.6 % (ref 36.0–46.0)
HEMOGLOBIN: 12.1 g/dL (ref 12.0–15.0)
MCH: 27.8 pg (ref 26.0–34.0)
MCHC: 32.2 g/dL (ref 30.0–36.0)
MCV: 86.2 fL (ref 78.0–100.0)
Platelets: 226 10*3/uL (ref 150–400)
RBC: 4.36 MIL/uL (ref 3.87–5.11)
RDW: 13.4 % (ref 11.5–15.5)
WBC: 5.2 10*3/uL (ref 4.0–10.5)

## 2015-04-12 LAB — TROPONIN I: Troponin I: <0.03 ng/mL (ref <0.031)

## 2015-04-12 MED ORDER — HYDROCODONE-ACETAMINOPHEN 5-325 MG PO TABS: 1.0000 | ORAL_TABLET | ORAL | Status: DC | PRN

## 2015-04-12 NOTE — Discharge Instructions (Signed)
Transient Global Amnesia Transient global amnesia causes a sudden and temporary (transient) loss of memory (amnesia). While you may recall memories from your distant past, including being able to recognize people you know well, you may not recall things that happened more recently in the past days, months, or even year. A transient global amnesia episode does not last longer than 24 hours.  Transient global amnesia does not affect your other brain functions. Your memory usually returns to normal after an episode is over. One episode of transient global amnesia does not make you more likely to have a stroke, a relapse, or other complications.  CAUSES  The cause of this condition is not known.  RISK FACTORS  Transient global amnesia is more likely to develop in people who:  Are 29-34 years old.  Have a history of migraine headaches. SYMPTOMS  The main symptoms of this condition include:  The inability to remember recent events.  Asking repetitive questions about the situation and surroundings and not recalling the answers to these questions. Other symptoms include:  Restlessness and nervousness.  Confusion.  Headaches.  Dizziness.  Nausea. DIAGNOSIS  Your health care provider may suspect transient global amnesia based on your symptoms. Your health care provider will do a physical exam. This may include a test to check your mental abilities (cognitive evaluation). You may also have imaging studies done to check your brain function. These may include:   Electroencephalography (EEG).  Diffusion-weighted imaging (DWI).  MRI. TREATMENT  There is no treatment for this condition. An episode typically goes away on its own after a few hours. If you also have a seizure or migraine during an episode, you will receive treatment for these conditions. This may include medicines. HOME CARE INSTRUCTIONS  Take medicines only as directed by your health care provider.  Tell your family or  friends that you have transient global amnesia. Ask them to help you avoid physical exertion, including sexual intercourse, swimming, and straining while holding your breath (Valsalva maneuver), until the episode passes. These are events that can bring on transient global amnesia attacks. SEEK MEDICAL CARE IF:   You have a migraine and it does not go away after you have followed your treatment plan for this condition.  You have a seizure for the first time, or a seizure that is different from seizures you normally have.  You experience transient global amnesia repeatedly.   This information is not intended to replace advice given to you by your health care provider. Make sure you discuss any questions you have with your health care provider.   Document Released: 03/28/2004 Document Revised: 11/09/2014 Document Reviewed: 11/03/2013 Elsevier Interactive Patient Education [employer].

## 2015-04-12 NOTE — Procedures (Signed)
ELECTROENCEPHALOGRAM REPORT  Date of Study: 04/12/2015  Patient's Name: Hailey Fox MRN: [id number] Date of Birth: May 19, 1940  Referring Provider: Dr. Linna Darner  Clinical History: This is a 75 year old woman with confusion.  Medications: acetaminophen (TYLENOL) tablet 650 mg aspirin EC tablet 81 mg azelastine (ASTELIN) 0.1 % nasal spray 1 spray chlorpheniramine (CHLOR-TRIMETON) tablet 4 mg HYDROcodone-acetaminophen (NORCO/VICODIN) 5-325 MG per tablet 1 tablet  Technical Summary: A multichannel digital EEG recording measured by the international 10-20 system with electrodes applied with paste and impedances below 5000 ohms performed in our laboratory with EKG monitoring in an awake and asleep patient.  Hyperventilation and photic stimulation were not performed.  The digital EEG was referentially recorded, reformatted, and digitally filtered in a variety of bipolar and referential montages for optimal display.    Description: The patient is awake and asleep during the recording.  During maximal wakefulness, there is a symmetric, medium voltage 10 Hz posterior dominant rhythm that attenuates with eye opening.  The record is symmetric.  There is an excess amount of diffuse low to medium voltage beta activity seen throughout the recording. During drowsiness and sleep, there is an increase in theta slowing of the background. Vertex waves and symmetric sleep spindles were seen.  Hyperventilation and photic stimulation were not performed.  There were no epileptiform discharges or electrographic seizures seen.    EKG lead showed irregular rhythm.  Impression: This awake and asleep EEG is normal except for excess amount of diffuse low to medium voltage beta activity.  Clinical Correlation: Diffuse low voltage beta activity is commonly seen with sedating medications such as benzodiazepines.  In the absence of sedating medications, anxiety and hyperthyroidism may produce generalized beta  activity.  The absence of epileptiform discharges does not exclude a clinical diagnosis of epilepsy. Clinical correlation is advised.   Ellouise Newer, M.D.

## 2015-04-12 NOTE — Discharge Summary (Signed)
Physician Discharge Summary  Hailey Fox [id number] DOB: 08/10/1940 DOA: 04/11/2015  PCP: Chesley Noon, MD  Admit date: 04/11/2015 Discharge date: 04/12/2015  Time spent: Less than 30 minutes  Recommendations for Outpatient Follow-up:  1. Dr. Anastasia Pall, PCP in 1 week. 2. Dr. Prince Rome, Cardiology 3. Outpatient physical therapy.  Discharge Diagnoses:  Active Problems:   Transient global amnesia   Multiple allergies   GERD (gastroesophageal reflux disease)   Discharge Condition: Improved & Stable  Diet recommendation: Heart healthy diet.  Filed Weights   04/11/15 1316  Weight: 69.1 kg (152 lb 5.4 oz)    History of present illness & Hospital course:  75 year old female with history of GERD, PACs, presented to [facility] ED on 04/11/15 with complaints of confusion/forgetfulness. History was provided by patient and her family. On 04/11/15, she was normal at 8:45 AM and was signing papers that were emotional to her. She went to medication and returned home around 11 AM. Teacher who drove her back from yoga noted that she was acting strange and was repeating "how did I get here and where is Hailey Fox?". Neurology was consulted. CT head negative. MRI brain: Negative without acute findings. EEG does not show epileptiform activity. As per neurology, sudden onset of confusion and repeating the same sentence is more characteristic of TGA rather than CVA or seizure. No signs and symptoms suggestive of infectious etiology. Elevated blood pressure on admission may have contributed. Blood pressure's since then have been mostly normal. As per family, her symptoms resolved and have not recurred over the last 20 hours. Neurology has seen her today and indicates that she is back to her baseline and have cleared her for discharge home without any restrictions or further recommendations. Speech therapy has no recommendations. OT has no recommendations. PT recommends outpatient PT. Patient complained of some left  posterior shoulder pain last night without radiation. She denied chest pain. This has since resolved. EKG obtained showed no acute changes and troponin 1 was negative. Pain seems musculoskeletal in etiology. Discussed at length with patient, her spouse and sister at bedside.   Consultations:  Neurology.  Procedures:  EEG 04/12/15: Impression: This awake and asleep EEG is normal except for excess amount of diffuse low to medium voltage beta activity.  Clinical Correlation: Diffuse low voltage beta activity is commonly seen with sedating medications such as benzodiazepines. In the absence of sedating medications, anxiety and hyperthyroidism may produce generalized beta activity. The absence of epileptiform discharges does not exclude a clinical diagnosis of epilepsy. Clinical correlation is advised.   Discharge Exam:  Complaints: Denies complaints. As per patient and family, and mental status has been at baseline since yesterday evening.  Filed Vitals:   04/12/15 0236 04/12/15 0428 04/12/15 0614 04/12/15 1000  BP: 131/75 140/51 138/66 135/66  Pulse: 70 51 58 78  Temp: 97.9 F (36.6 C) 98.1 F (36.7 C) 97.7 F (36.5 C) 97.8 F (36.6 C)  TempSrc: Oral Oral Oral Oral  Resp: 18 16 16 17   Height:      Weight:      SpO2: 99% 96% 98% 96%    General exam: Pleasant elderly female sitting up comfortably on chair this morning. Respiratory system: Clear. No increased work of breathing. Cardiovascular system: S1 & S2 heard, RRR. No JVD, murmurs, gallops, clicks or pedal edema. Telemetry: SR-SB in the 50s. Gastrointestinal system: Abdomen is nondistended, soft and nontender. Normal bowel sounds heard. Central nervous system: Alert and oriented. No focal neurological deficits. Extremities: Symmetric 5 x  5 power.  Discharge Instructions      Discharge Instructions    Call MD for:  difficulty breathing, headache or visual disturbances    Complete by:  As directed      Call MD for:   extreme fatigue    Complete by:  As directed      Call MD for:  persistant dizziness or light-headedness    Complete by:  As directed      Call MD for:  persistant nausea and vomiting    Complete by:  As directed      Call MD for:  severe uncontrolled pain    Complete by:  As directed      Call MD for:  temperature >100.4    Complete by:  As directed      Call MD for:    Complete by:  As directed   Confusion or strokelike symptoms.     Diet - low sodium heart healthy    Complete by:  As directed      Increase activity slowly    Complete by:  As directed             Medication List    STOP taking these medications        HYDROcodone-acetaminophen 5-325 MG tablet  Commonly known as:  NORCO      TAKE these medications        aspirin 81 MG tablet  Take 81 mg by mouth daily.     atorvastatin 10 MG tablet  Commonly known as:  LIPITOR  Take 10 mg by mouth daily.     azelastine 0.1 % nasal spray  Commonly known as:  ASTELIN  Place 1 spray into the nose 2 (two) times daily.     chlorpheniramine 4 MG tablet  Commonly known as:  CHLOR-TRIMETON  Take 4 mg by mouth 2 (two) times daily as needed for allergies.     Coenzyme Q10-Levocarnitine 100-20 MG Caps  Take 1 capsule by mouth daily.     ibuprofen 200 MG tablet  Commonly known as:  ADVIL,MOTRIN  Take 200 mg by mouth every 6 (six) hours as needed for headache.     KRILL OIL PO  Take 1 tablet by mouth daily.     VITAMIN D-3 PO  Take 2,000 mg by mouth daily.       Follow-up Information    Follow up with Chesley Noon, MD. Schedule an appointment as soon as possible for a visit on 04/20/2015.   Specialty:  Family Medicine   Why:  ZARLA  scheduled appt 10am  on 04/20/15   Contact information:   [place] [address] [phone]       Schedule an appointment as soon as possible for a visit with Janit Pagan, MD.   Specialty:  Cardiology   Contact information:   [place] [address] [phone]        The results of significant diagnostics from this hospitalization (including imaging, microbiology, ancillary and laboratory) are listed below for reference.    Significant Diagnostic Studies: Ct Head Wo Contrast  04/11/2015  ADDENDUM REPORT: 04/11/2015 13:47 ADDENDUM: Critical Value/emergent results were called by telephone at the time of interpretation on 04/11/2015 at 1:40 pm to Dr. Janann Colonel, neurology, who verbally acknowledged these results. Electronically Signed   By: Lowella Grip III M.D.   On: 04/11/2015 13:47  04/11/2015  CLINICAL DATA:  Acute onset memory loss/amnesia EXAM:  CT HEAD WITHOUT CONTRAST TECHNIQUE: Contiguous axial images were obtained from the base of the skull through the vertex without intravenous contrast. COMPARISON:  April 15, 2006 FINDINGS: There is moderate diffuse atrophy. There is no intracranial mass, hemorrhage, extra-axial fluid collection, or midline shift. On axial slice 8 series [id number], there is subtle decreased attenuation in the mid posterior left cerebellum measuring 1.3 x 0.5 cm. This area may represent a small early left cerebellar infarct. Elsewhere, there is minimal periventricular small vessel disease. Gray-white compartments otherwise appear normal. The middle cerebral artery vascular attenuation appear symmetric bilaterally. Bony calvarium appears intact. The mastoid air cells are clear. Orbits appear symmetric bilaterally. IMPRESSION: Atrophy with rather minimal periventricular small vessel disease. Question small early infarct left posterior mid cerebellum. Elsewhere gray-white compartments appear normal. No hemorrhage or mass effect. Electronically Signed: By: Lowella Grip III M.D. On: 04/11/2015 13:27   Mr Jodene Nam Head Wo Contrast  04/11/2015  CLINICAL DATA:  Stroke.  Confusion. EXAM: MRI HEAD WITHOUT CONTRAST MRA HEAD WITHOUT CONTRAST TECHNIQUE: Multiplanar, multiecho pulse sequences of the brain and  surrounding structures were obtained without intravenous contrast. Angiographic images of the head were obtained using MRA technique without contrast. COMPARISON:  CT head 04/11/2015 FINDINGS: MRI HEAD FINDINGS Negative for acute infarct. Moderate atrophy. Mild ventricular enlargement consistent with atrophy. Mild chronic microvascular ischemic changes in the white matter. Small chronic infarct left pons. No cerebellar infarct. Negative for intracranial hemorrhage or fluid collection. No mass or edema. No shift of the midline structures. Paranasal sinuses clear. No orbital lesion. Bilateral lens replacement. Pituitary not enlarged. Normal calvarium. MRA HEAD FINDINGS Both vertebral arteries patent to the basilar. Right PICA patent. Left PICA not visualized. AICA, superior cerebellar, posterior cerebral arteries widely patent Internal carotid artery widely patent bilaterally without stenosis or aneurysm. Anterior and middle cerebral arteries appear normal without stenosis. No aneurysm. IMPRESSION: Atrophy and mild chronic microvascular ischemia.  No acute infarct Negative MRA head Electronically Signed   By: Franchot Gallo M.D.   On: 04/11/2015 14:41   Mr Brain Wo Contrast  04/11/2015  CLINICAL DATA:  Stroke.  Confusion. EXAM: MRI HEAD WITHOUT CONTRAST MRA HEAD WITHOUT CONTRAST TECHNIQUE: Multiplanar, multiecho pulse sequences of the brain and surrounding structures were obtained without intravenous contrast. Angiographic images of the head were obtained using MRA technique without contrast. COMPARISON:  CT head 04/11/2015 FINDINGS: MRI HEAD FINDINGS Negative for acute infarct. Moderate atrophy. Mild ventricular enlargement consistent with atrophy. Mild chronic microvascular ischemic changes in the white matter. Small chronic infarct left pons. No cerebellar infarct. Negative for intracranial hemorrhage or fluid collection. No mass or edema. No shift of the midline structures. Paranasal sinuses clear. No orbital  lesion. Bilateral lens replacement. Pituitary not enlarged. Normal calvarium. MRA HEAD FINDINGS Both vertebral arteries patent to the basilar. Right PICA patent. Left PICA not visualized. AICA, superior cerebellar, posterior cerebral arteries widely patent Internal carotid artery widely patent bilaterally without stenosis or aneurysm. Anterior and middle cerebral arteries appear normal without stenosis. No aneurysm. IMPRESSION: Atrophy and mild chronic microvascular ischemia.  No acute infarct Negative MRA head Electronically Signed   By: Franchot Gallo M.D.   On: 04/11/2015 14:41    Microbiology: No results found for this or any previous visit (from the past 240 hour(s)).   Labs: Basic Metabolic Panel:  Recent Labs Lab 04/11/15 1307 04/11/15 1317  NA 142 140  K 4.4 4.0  CL 103 101  CO2 29  --   GLUCOSE 105* 95  BUN  12 16  CREATININE 1.02* 0.90  CALCIUM 9.7  --   MG 1.9  --   PHOS 2.8  --    Liver Function Tests:  Recent Labs Lab 04/11/15 1307  AST 27  ALT 24  ALKPHOS 47  BILITOT 0.8  PROT 7.0  ALBUMIN 4.0   No results for input(s): LIPASE, AMYLASE in the last 168 hours. No results for input(s): AMMONIA in the last 168 hours. CBC:  Recent Labs Lab 04/11/15 1307 04/11/15 1317 04/12/15 0526  WBC 6.3  --  5.2  NEUTROABS 4.7  --   --   HGB 13.2 13.9 12.1  HCT 39.5 41.0 37.6  MCV 85.7  --  86.2  PLT 221  --  226   Cardiac Enzymes:  Recent Labs Lab 04/12/15 0526  TROPONINI <0.03   BNP: BNP (last 3 results) No results for input(s): BNP in the last 8760 hours.  ProBNP (last 3 results) No results for input(s): PROBNP in the last 8760 hours.  CBG:  Recent Labs Lab 04/11/15 1308  GLUCAP 80       Signed:  Vernell Leep, MD, FACP, FHM. [facility] Pager [phone]  If 7PM-7AM, please contact night-coverage [email or web address] Password [email or web address] 04/12/2015, 1:44 PM

## 2015-04-12 NOTE — Evaluation (Signed)
Occupational Therapy Evaluation Patient Details Name: Hailey Fox MRN: [id number] DOB: 1940-11-19 Today's Date: 04/12/2015    History of Present Illness Pt is a 75 y.o. female presenting with confusion over events that occurred earlier in the day. Patient states that there is a gap of time in her memory today. Patient denies any recent medication changes, change in diet, appetite, fevers, nausea, vomiting, diarrhea, URI symptoms, headaches, dizziness, falls, LOC, loss of strength in any single extremity, slurred speech, facial weakness or numbness. MRI negative for acute changes.   Clinical Impression   Pt reports she was independent with ADLs and mobility PTA. Currently pt overall supervision for safety with ADLs in standing and functional mobility secondary to reported bilateral LE weakness. No LOB or unsteadiness on feet noted with functional activities this session. Pt planning to d/c home with 24/7 supervision from family. Pt ready to d/c from OT standpoint; signing off at this time. Thank you for this referral.     Follow Up Recommendations  No OT follow up;Supervision - Intermittent    Equipment Recommendations  None recommended by OT    Recommendations for Other Services PT consult     Precautions / Restrictions Precautions Precautions: None Restrictions Weight Bearing Restrictions: No      Mobility Bed Mobility Overal bed mobility: Modified Independent                Transfers Overall transfer level: Needs assistance Equipment used: None Transfers: Sit to/from Stand Sit to Stand: Supervision         General transfer comment: Supervision for safety secondary to reported bilateral LE weakness.    Balance Overall balance assessment: No apparent balance deficits (not formally assessed)                                          ADL Overall ADL's : Needs assistance/impaired Eating/Feeding: Modified independent                                   Functional mobility during ADLs: Supervision/safety General ADL Comments: Pts husband and sister present for OT eval. Pt overall supervision for safety with ADLs in standing and functional mobility due to reported bilateral LE weakness. No LOB noted during functional activities in session.      Vision Vision Assessment?: No apparent visual deficits   Perception     Praxis      Pertinent Vitals/Pain Pain Assessment: 0-10 Pain Score: 1  Pain Location: headache Pain Descriptors / Indicators: Headache Pain Intervention(s): Monitored during session;Repositioned     Hand Dominance Left   Extremity/Trunk Assessment Upper Extremity Assessment Upper Extremity Assessment: Overall WFL for tasks assessed   Lower Extremity Assessment Lower Extremity Assessment: Defer to PT evaluation   Cervical / Trunk Assessment Cervical / Trunk Assessment: Normal   Communication Communication Communication: No difficulties   Cognition Arousal/Alertness: Awake/alert Behavior During Therapy: WFL for tasks assessed/performed Overall Cognitive Status: Within Functional Limits for tasks assessed                     General Comments       Exercises       Shoulder Instructions      Home Living Family/patient expects to be discharged to:: Private residence Living Arrangements: Spouse/significant other Available Help at Discharge: Family;Available 24  hours/day Type of Home: House       Home Layout: Two level;Bed/bath upstairs     Bathroom Shower/Tub: Tub/shower unit Shower/tub characteristics: Architectural technologist: Standard     Home Equipment: Grab bars - tub/shower          Prior Functioning/Environment Level of Independence: Independent             OT Diagnosis: Generalized weakness   OT Problem List:     OT Treatment/Interventions:      OT Goals(Current goals can be found in the care plan section) Acute Rehab OT Goals Patient  Stated Goal: return to doing yoga OT Goal Formulation: All assessment and education complete, DC therapy  OT Frequency:     Barriers to D/C:            Co-evaluation              End of Session Equipment Utilized During Treatment: Gait belt  Activity Tolerance: Patient tolerated treatment well Patient left: in chair;with call bell/phone within reach;with family/visitor present   Time: [id number] OT Time Calculation (min): 16 min Charges:  OT General Charges $OT Visit: 1 Procedure OT Evaluation $OT Eval Low Complexity: 1 Procedure G-Codes: OT G-codes **NOT FOR INPATIENT CLASS** Functional Assessment Tool Used: Clinical judgement Functional Limitation: Self care Self Care Current Status [id number]): At least 1 percent but less than 20 percent impaired, limited or restricted Self Care Goal Status [id number]): At least 1 percent but less than 20 percent impaired, limited or restricted Self Care Discharge Status [phone]): At least 1 percent but less than 20 percent impaired, limited or restricted   Binnie Kand M.S., OTR/L Pager: [phone]  04/12/2015, 10:05 AM

## 2015-04-12 NOTE — Evaluation (Signed)
Clinical/Bedside Swallow Evaluation Patient Details  Name: Hailey Fox MRN: [id number] Date of Birth: September 15, 1940  Today's Date: 04/12/2015 Time: SLP Start Time (ACUTE ONLY): 0815 SLP Stop Time (ACUTE ONLY): 0824 SLP Time Calculation (min) (ACUTE ONLY): 9 min  Past Medical History:  Past Medical History  Diagnosis Date  . Osteoporosis   . Allergy   . Complication of anesthesia     slow to wake up  . PONV (postoperative nausea and vomiting)   . Dysrhythmia     PAC's   . GERD (gastroesophageal reflux disease)   . 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 Arthritis   . Cataract     bil   Past Surgical History:  Past Surgical History  Procedure Laterality Date  . Tubal ligation  early 80's  . Appendectomy  early 56's  . Tonsillectomy  1945  . Breast surgery Right 2003    palpiloma  . Parathyroidectomy  [id number]  . Inguinal hernia repair Left 08/18/2012    Procedure: LEFT INGUINAL HERNIA REPAIR;  Surgeon: Haywood Lasso, MD;  Location: [place];  Service: General;  Laterality: Left;  . Hernia repair     HPI:  Hailey Fox is a 75 y.o. female presenting with confusion over events that occurred earlier in the day. Patient states that there is a gap of time in her memory today.  Patient denies any recent medication changes, change in diet, appetite, fevers, nausea, vomiting, diarrhea, URI symptoms, headaches, dizziness, falls, LOC, loss of strength in any single extremity, slurred speech, facial weakness or numbness. MRI negative for acute changes.    Assessment / Plan / Recommendation Clinical Impression   Pt presents with s/s of grossly intact oropharyngeal swallowing function.  Pt demonstrated no overt s/s of aspiration when consuming solids or liquids, even when challenged with large, consecutive boluses via straw.  Pt did have intermittent throat clearing before PO intake which did not increase in frequency with POs.  Pt reports this to be  baseline and attributable to history of allergies with frequent post-nasal drainage.  Recommend that pt remain on Regular textures, thin liquids.  Given that pt is at baseline for swallowing function, no further ST needs are indicated at this time.      Aspiration Risk  No limitations    Diet Recommendation Regular;Thin liquid   Liquid Administration via: Cup;Straw Medication Administration: Whole meds with liquid Supervision: Patient able to self feed       Follow up Recommendations  None      Swallow Study   General Date of Onset: 04/11/15 HPI: Hailey Fox is a 76 y.o. female presenting with confusion over events that occurred earlier in the day. Patient states that there is a gap of time in her memory today.  Patient denies any recent medication changes, change in diet, appetite, fevers, nausea, vomiting, diarrhea, URI symptoms, headaches, dizziness, falls, LOC, loss of strength in any single extremity, slurred speech, facial weakness or numbness. MRI negative for acute changes.  Type of Study: Bedside Swallow Evaluation Previous Swallow Assessment: none on record Diet Prior to this Study: Regular;Thin liquids Temperature Spikes Noted: No Respiratory Status: Room air History of Recent Intubation: No Behavior/Cognition: Alert;Cooperative;Pleasant mood Oral Cavity Assessment: Within Functional Limits Oral Care Completed by SLP: No Oral Cavity - Dentition: Adequate natural dentition Vision: Functional for self-feeding Self-Feeding Abilities: Able to feed self Patient Positioning: Upright in bed Baseline Vocal Quality: Normal  Volitional Cough: Strong Volitional Swallow: Able to elicit    Oral/Motor/Sensory Function Overall Oral Motor/Sensory Function: Within functional limits   Ice Chips     Thin Liquid Thin Liquid: Within functional limits    Nectar Thick     Honey Thick     Puree Puree: Within functional limits   Solid   GO   Solid: Within functional limits     Functional Assessment Tool Used: bedside swallowing evaluation completed Functional Limitations: Swallowing Swallow Current Status [id number]): 0 percent impaired, limited or restricted Swallow Goal Status [id number]): 0 percent impaired, limited or restricted Swallow Discharge Status [phone]): 0 percent impaired, limited or restricted   [name] L 04/12/2015,8:32 AM

## 2015-04-12 NOTE — Evaluation (Signed)
Physical Therapy Evaluation Patient Details Name: Hailey Fox MRN: [id number] DOB: 02-16-1941 Today's Date: 04/12/2015   History of Present Illness  Pt is a 75 y.o. female presenting with confusion over events that occurred earlier in the day. Patient states that there is a gap of time in her memory today.MRI negative for acute changes.  Clinical Impression  Pt admitted with above diagnosis. Pt currently with functional limitations due to the deficits listed below (see PT Problem List). At the time of PT eval pt was able to perform transfers and ambulation with supervision for safety. Min guard provided for stair negotiation. Pt will benefit from skilled PT to increase their independence and safety with mobility to allow discharge to the venue listed below. Will continue to see acutely as pt states her LE's feel weak, however will likely be for only one more visit.     Follow Up Recommendations Outpatient PT;Supervision for mobility/OOB    Equipment Recommendations  None recommended by PT    Recommendations for Other Services       Precautions / Restrictions Precautions Precautions: None Restrictions Weight Bearing Restrictions: No      Mobility  Bed Mobility Overal bed mobility: Modified Independent             General bed mobility comments: Pt sitting up in recliner upon PT arrival.   Transfers Overall transfer level: Needs assistance Equipment used: None Transfers: Sit to/from Stand Sit to Stand: Supervision         General transfer comment: Light supervision for safety secondary to reported bilateral LE weakness. No assist required.  Ambulation/Gait Ambulation/Gait assistance: Supervision;Min guard Ambulation Distance (Feet): 400 Feet Assistive device: None Gait Pattern/deviations: Step-through pattern;Decreased stride length Gait velocity: Decreased Gait velocity interpretation: Below normal speed for age/gender General Gait Details: Slow and guarded  initially. Assured pt she was safe walking with therapist and she was able to progress to her normal gait speed without assistance or LOB.   Stairs Stairs: Yes Stairs assistance: Min guard Stair Management: One rail Right;Alternating pattern;Forwards Number of Stairs: 5 (x2 bouts) General stair comments: Close guard for safety  Wheelchair Mobility    Modified Rankin (Stroke Patients Only)       Balance Overall balance assessment: No apparent balance deficits (not formally assessed)                                           Pertinent Vitals/Pain Pain Assessment: No/denies pain Pain Score: 1  Pain Location: headache Pain Descriptors / Indicators: Headache Pain Intervention(s): Monitored during session;Repositioned    Home Living Family/patient expects to be discharged to:: Private residence Living Arrangements: Spouse/significant other Available Help at Discharge: Family;Available 24 hours/day Type of Home: House Home Access: Stairs to enter   [employer] of Steps: 3 Home Layout: Two level;Bed/bath upstairs Home Equipment: Grab bars - tub/shower      Prior Function Level of Independence: Independent               Hand Dominance   Dominant Hand: Left    Extremity/Trunk Assessment   Upper Extremity Assessment: Defer to OT evaluation           Lower Extremity Assessment: Overall WFL for tasks assessed      Cervical / Trunk Assessment: Normal  Communication   Communication: No difficulties  Cognition Arousal/Alertness: Awake/alert Behavior During Therapy: WFL for tasks assessed/performed  Overall Cognitive Status: Within Functional Limits for tasks assessed                      General Comments      Exercises        Assessment/Plan    PT Assessment Patient needs continued PT services  PT Diagnosis Difficulty walking   PT Problem List Decreased strength;Decreased range of motion;Decreased activity  tolerance;Decreased balance;Decreased mobility;Decreased knowledge of use of DME;Decreased safety awareness;Decreased knowledge of precautions  PT Treatment Interventions DME instruction;Stair training;Gait training;Functional mobility training;Therapeutic activities;Therapeutic exercise;Neuromuscular re-education;Patient/family education   PT Goals (Current goals can be found in the Care Plan section) Acute Rehab PT Goals Patient Stated Goal: return to doing yoga PT Goal Formulation: With patient/family Time For Goal Achievement: 04/19/15 Potential to Achieve Goals: Good    Frequency Min 3X/week   Barriers to discharge        Co-evaluation               End of Session Equipment Utilized During Treatment: Gait belt Activity Tolerance: Patient tolerated treatment well Patient left: in chair;with call bell/phone within reach;with family/visitor present Nurse Communication: Mobility status    Functional Assessment Tool Used: Clinical judgement Functional Limitation: Mobility: Walking and moving around Mobility: Walking and Moving Around Current Status [phone]): At least 1 percent but less than 20 percent impaired, limited or restricted Mobility: Walking and Moving Around Goal Status [phone]): At least 1 percent but less than 20 percent impaired, limited or restricted    Time: [id number] PT Time Calculation (min) (ACUTE ONLY): 24 min   Charges:   PT Evaluation $PT Eval Moderate Complexity: 1 Procedure PT Treatments $Gait Training: 8-22 mins   PT G Codes:   PT G-Codes **NOT FOR INPATIENT CLASS** Functional Assessment Tool Used: Clinical judgement Functional Limitation: Mobility: Walking and moving around Mobility: Walking and Moving Around Current Status [id number]): At least 1 percent but less than 20 percent impaired, limited or restricted Mobility: Walking and Moving Around Goal Status [phone]): At least 1 percent but less than 20 percent impaired, limited or restricted     Rolinda Roan 04/12/2015, 12:36 PM   Rolinda Roan, PT, DPT Acute Rehabilitation Services Pager: [phone]

## 2015-04-12 NOTE — Progress Notes (Addendum)
Patient with three occurences of posterior left shoulder pain. She says it does not radiate anywhere but is heavy and has not had it before. She says she does not have a significant cardiac history but has had "PAC's and PVC's in the past." Vitals are stable. MD paged. Continuing to monitor closely. Kenard Morawski, Rande Brunt, RN   EKG completed and transferred into Epic. MD aware.

## 2015-04-12 NOTE — Progress Notes (Signed)
Subjective: Back to baseline.   Exam: Filed Vitals:   04/12/15 0428 04/12/15 0614  BP: 140/51 138/66  Pulse: 51 58  Temp: 98.1 F (36.7 C) 97.7 F (36.5 C)  Resp: 16 16       Gen: In bed, NAD MS: alert and oriented CN: 2-12 intact Motor: MAEW Sensory: intact throughout DTR: 2+   Pertinent Labs: MRI normal  Etta Quill PA-C [facility] [phone]  Impression: patient is back to baseline. States she was back to baseline as of last night. Given exam and findings likely TGA. Still awaiting EEG.  If EEG normal no further recommendations.     Jim Like, DO [facility] [phone]  If 7pm- 7am, please page neurology on call as listed in AMION.   04/12/2015, 10:03 AM

## 2015-04-12 NOTE — Care Management Obs Status (Signed)
[place] NOTIFICATION   Patient Details  Name: Hailey Fox MRN: [id number] Date of Birth: [birth year]   Medicare Observation Status Notification Given:  Yes    Pollie Friar, RN 04/12/2015, 1:57 PM

## 2015-04-12 NOTE — Care Management Note (Signed)
Case Management Note  Patient Details  Name: Hailey Fox MRN: [id number] Date of Birth: January 24, 1941  Subjective/Objective:                    Action/Plan: Plan is for patient to discharge home with self care. Orders for outpatient PT. CM met with the patient and she was interested in outpatient rehab services at [facility]. CM provided her the information on [facility] and placed it on the AVS. Bedside RN updated.   Expected Discharge Date:   (Pending)               Expected Discharge Plan:  Home/Self Care  In-House Referral:     Discharge planning Services  CM Consult  Post Acute Care Choice:    Choice offered to:     DME Arranged:    DME Agency:     HH Arranged:    [place] Agency:     Status of Service:  Completed, signed off  Medicare Important Message Given:    Date Medicare IM Given:    Medicare IM give by:    Date Additional Medicare IM Given:    Additional Medicare Important Message give by:     If discussed at [place] of Stay Meetings, dates discussed:    Additional Comments:  Pollie Friar, RN 04/12/2015, 1:55 PM

## 2015-04-12 NOTE — Progress Notes (Signed)
EEG Completed; Results Pending  

## 2015-10-25 ENCOUNTER — Other Ambulatory Visit: Payer: Self-pay | Admitting: Gastroenterology

## 2016-03-28 ENCOUNTER — Ambulatory Visit: Payer: Self-pay

## 2016-03-28 ENCOUNTER — Ambulatory Visit (INDEPENDENT_AMBULATORY_CARE_PROVIDER_SITE_OTHER): Payer: Medicare Other | Admitting: Sports Medicine

## 2016-03-28 ENCOUNTER — Encounter: Payer: Self-pay | Admitting: Sports Medicine

## 2016-03-28 VITALS — BP 137/78 | HR 58 | Ht 62.0 in | Wt 138.0 lb

## 2016-03-28 DIAGNOSIS — G8929 Other chronic pain: Secondary | ICD-10-CM | POA: Diagnosis not present

## 2016-03-28 DIAGNOSIS — M7660 Achilles tendinitis, unspecified leg: Secondary | ICD-10-CM | POA: Diagnosis not present

## 2016-03-28 DIAGNOSIS — M79672 Pain in left foot: Secondary | ICD-10-CM | POA: Diagnosis not present

## 2016-03-28 NOTE — Progress Notes (Signed)
CC; Left post heel pain  Patient likes to walk for exercise Most recently she gets intermittent post heel pain This seems behind AT No first morning pain No pain on bottom of foot No Hx of specific injury May have arose with more intense walking about 3 to 4 mons ago  ROS No LBP No sciatica No numbness in foot or leg  PE Pleasant older F in NAD BP 137/78   Pulse (!) 58   Ht 5\' 2"  (1.575 m)   Wt 138 lb (62.6 kg)   BMI 25.24 kg/m   Mild peripheral edema of left lower leg Neutral foot No plantar pain No pain over PF insertion No pain on AT squeeze No swelling or redness With plantar flexion there is a clicking over deep AT about 5 cm above calcaneus  Ultrasound of Left Achilles Tendon  Achilles is normal at insertion Calcaneus appears normal AT width in AP plane is only 0.4 cms No tears or disruption of normal AT fibrillary pattern Insertion of plantaris is visualized and this seems area of snapping on dynamic exam Normal doppler flow No hypoechoic swelling noted  Summary:  Ultrasound and exam are consistent with partial separation of attachment of plantaris tendon to deep Achilles Tendon  Ultrasound and interpretation by Wolfgang Phoenix. Oneida Alar, MD

## 2016-03-28 NOTE — Assessment & Plan Note (Signed)
We will use compression sleeve for ankle during exercise Start Hailey Fox exercise pattern  Heel lifts  See if this resolves over next 2 mos or reck if not

## 2016-07-10 ENCOUNTER — Ambulatory Visit (INDEPENDENT_AMBULATORY_CARE_PROVIDER_SITE_OTHER): Payer: Medicare Other | Admitting: Student

## 2016-07-10 DIAGNOSIS — M25562 Pain in left knee: Secondary | ICD-10-CM | POA: Diagnosis not present

## 2016-07-10 NOTE — Progress Notes (Signed)
  Hailey Fox - 76 y.o. female MRN [id number]  Date of birth: [birth year]  SUBJECTIVE:  Including CC & ROS.  CC: left knee pain  Presents with left knee pain of 12 hours duration. He reports that she was walking down the stairs at her to pain in the knee and felt as though she needed to crack it. She reports not much swelling to the area. She started doing exercises that had been given to her husband for knee pain. She reports these have not helped. She has taken Tylenol which took the edge off the pain. He denies any twisting to her knee. She has not wanted injection or an MRI   ROS: No unexpected weight loss, fever, chills, swelling, instability, muscle pain, numbness/tingling, redness, otherwise see HPI   PMHx - Updated and reviewed.  Contributory factors include: GERD, allergies PSHx - Updated and reviewed.  Contributory factors include:  Negative FHx - Updated and reviewed.  Contributory factors include:  Negative Social Hx - Updated and reviewed. Contributory factors include: Negative Medications - reviewed   DATA REVIEWED: none  PHYSICAL EXAM:  VS: BP:(!) 150/62  HR: bpm  TEMP: ( )  RESP:   HT:5\' 3"  (160 cm)   WT:138 lb (62.6 kg)  BMI:24.5 PHYSICAL EXAM: Gen: NAD, alert, cooperative with exam, well-appearing HEENT: clear conjunctiva,  CV:  no edema, capillary refill brisk, normal rate Resp: non-labored Skin: no rashes, normal turgor  Neuro: no gross deficits.  Psych:  alert and oriented  Knee: Normal to inspection with no erythema or effusion or obvious bony abnormalities. Palpation with no warmth, patellar tenderness, or condyle tenderness. +mild medial joint line tenderness ROM full in flexion and extension and lower leg rotation. Ligaments with solid consistent endpoints including ACL, PCL, LCL, MCL. Negative Mcmurray's, Apley's, and Thessalonian tests. Non painful patellar compression. Patellar glide with crepitus. Patellar and quadriceps tendons  unremarkable. Hamstring and quadriceps strength is normal.     ASSESSMENT & PLAN:   Left knee pain Suspect that she may have some patellofemoral arthritis in her knee versus cartilage tear versus just knee synovitis. Recommended resting. She can take Tylenol or Aleve for pain. If this does not calm down in the next couple weeks, she can consider getting x-rays to assess her joint spaces. She does not have to come in for x-rays but can just call. If she would like an injection she will need to make an appointment.

## 2016-07-10 NOTE — Assessment & Plan Note (Signed)
Suspect that she may have some patellofemoral arthritis in her knee versus cartilage tear versus just knee synovitis. Recommended resting. She can take Tylenol or Aleve for pain. If this does not calm down in the next couple weeks, she can consider getting x-rays to assess her joint spaces. She does not have to come in for x-rays but can just call. If she would like an injection she will need to make an appointment.

## 2016-07-23 ENCOUNTER — Telehealth: Payer: Self-pay | Admitting: Student

## 2016-07-23 ENCOUNTER — Ambulatory Visit
Admission: RE | Admit: 2016-07-23 | Discharge: 2016-07-23 | Disposition: A | Discharge status: Home / Self Care | Payer: Medicare Other | Source: Ambulatory Visit | Attending: Student | Admitting: Student

## 2016-07-23 DIAGNOSIS — M25562 Pain in left knee: Secondary | ICD-10-CM

## 2016-07-23 NOTE — Addendum Note (Signed)
Addended by: Cyd Silence on: 07/23/2016 11:37 AM   Modules accepted: Orders

## 2016-07-23 NOTE — Telephone Encounter (Signed)
Called pt and verified name and DOB.  Gave x-ray results, which show a small joint effusion.  Upon my review, possibly a small amount of OA, but not a lot related to age.  Gave options- NSAIDs, exercises, injection, vs MRI.  Possible meniscus tear vs chondromalacia.  Has an appt Thursday with Dr. Oneida Alar to discuss.    Signed,  Balinda Quails, DO [facility] 5:45 PM 07/23/16

## 2016-07-25 ENCOUNTER — Ambulatory Visit (INDEPENDENT_AMBULATORY_CARE_PROVIDER_SITE_OTHER): Payer: Medicare Other | Admitting: Sports Medicine

## 2016-07-25 DIAGNOSIS — M23203 Derangement of unspecified medial meniscus due to old tear or injury, right knee: Secondary | ICD-10-CM

## 2016-07-25 MED ORDER — METHYLPREDNISOLONE ACETATE 40 MG/ML IJ SUSP
40.0000 mg | Freq: Once | INTRAMUSCULAR | Status: AC
  Administered 2016-07-25: 40 mg via INTRA_ARTICULAR

## 2016-07-25 NOTE — Patient Instructions (Signed)
Take 2 aleve twice a day to improve pain.  Rest for 1 week.  Week 2: easy exercises 3 Exercises: 10-15 repetitions of each daily Straight leg raises Lateral leg raises (on side) Medial leg raises  Week 3: start walking  Use body helix compression sleeve.   We hope your knee feels better!

## 2016-07-25 NOTE — Progress Notes (Signed)
Hailey Fox - 76 y.o. female MRN [id number]  Date of birth: Sep 13, 1940  SUBJECTIVE:  Including CC & ROS.  CC: left knee pain  Patient initially presented on 5/9 with acute left knee pain after walking down the stairs. Since that time, she has had dull, achy pain within the knee, despite resting her knee and taking ibuprofen. The pain has not improved and is limiting her mobility, especially with going up and down stairs. She has not iced her knee. Heating pad has helped some. She now also has lower back pain which she feels may be due to her favoring her left knee.   ROS: No unexpected weight loss, fever, chills, swelling, instability, muscle pain, numbness/tingling, redness, otherwise see HPI   PMHx - Updated and reviewed.  Contributory factors include: GERD, allergies, SOB? cardiac PSHx - Updated and reviewed.  Contributory factors include:  Negative FHx - Updated and reviewed.  Contributory factors include:  Negative Social Hx - Updated and reviewed. Contributory factors include: Negative Medications - reviewed   DATA REVIEWED: DG Knee Complete 4 Views: Osseous demineralization and small knee joint effusion without definite acute bony abnormality  PHYSICAL EXAM:  VS: BP:   HR: bpm  TEMP: ( )  RESP:   HT:    WT:   BMI:  PHYSICAL EXAM: Gen: NAD, alert, cooperative with exam, well-appearing HEENT: clear conjunctiva,  CV:  no edema, capillary refill brisk, normal rate Resp: non-labored Skin: no rashes, normal turgor  Neuro: no gross deficits.  Psych:  alert and oriented  Left Knee: On inspection, no erythema, mild effusion in posterior knee, no obvious bony abnormalities. Palpation with no warmth, + patellar tenderness, or condyle tenderness. +mild medial joint line tenderness ROM full in flexion and extension and lower leg rotation. Ligaments with solid consistent endpoints including ACL, PCL, LCL, MCL. Positive Thessaly tests. Painful patellar compression. Patellar  glide with crepitus. Patellar and quadriceps tendons unremarkable. Hamstring and quadriceps strength is normal. Leg and foot neurovascularly intact.   Limited ultrasound of L knee: 7 x 3 cm hypoechoic area in suprapatellar pouch consistent with moderate effusion. Lateral meniscus intact. Medial mensicus with loose body and mild degeneration. Hypoechoic change around joint line.   Patellar tendon intact. L baker cyst noted.  PROCEDURES: PROCEDURE NOTE : Left supra-patellar pouch After discussing the risks, benefits and expected outcomes of the injection and all questions were reviewed and answered,  she wished to undergo the above named procedure.  Written consent was obtained. After an appropriate time out was taken the left shoulder was sterilely prepped and injected as below: Noted no overlying erythema, induration, or other signs of local infection. Skin prepped in a sterile fashion. Topical analgesic spray: Ethyl chloride. Joint: left knee, lateral approach to suprapatellar pouch guided by Ultrasound Needle: 22g 1.5 inch Completed without difficulty. Meds: 1cc (40mg ) depomedrol, 3cc 1% xylocaine A bandaid was applied to the area. This procedure was well tolerated and there were no complications.    I supervised both Ultrasound and injection for patient.  Ila Mcgill, MD   ASSESSMENT & PLAN:  Left knee pain- pain is likely secondary to loose body from meniscus that has caused intra-articular inflammation and swelling. X-rays of knee revealed no OA, good joint space, and mild osseus demineralization.   Plan: - steroid injection to improve inflammation - Use body helix compression sleeve - Rest x1 week, then work into easy exercises and walking - 2 Aleve BID - Return in 1 month  I observed and  examined the patient with the resident and agree with assessment and plan.  Note reviewed and modified by me. Stefanie Libel, MD

## 2016-07-26 DIAGNOSIS — M23203 Derangement of unspecified medial meniscus due to old tear or injury, right knee: Secondary | ICD-10-CM | POA: Insufficient documentation

## 2016-07-26 NOTE — Assessment & Plan Note (Signed)
See our current plan  Rest followed by limited HEP  Reck 4 wks

## 2016-07-30 ENCOUNTER — Emergency Department (HOSPITAL_COMMUNITY)
Admission: EM | Admit: 2016-07-30 | Discharge: 2016-07-30 | Disposition: A | Discharge status: Home / Self Care | Payer: Medicare Other | Attending: Emergency Medicine | Admitting: Emergency Medicine

## 2016-07-30 ENCOUNTER — Emergency Department (HOSPITAL_COMMUNITY): Payer: Medicare Other

## 2016-07-30 ENCOUNTER — Encounter (HOSPITAL_COMMUNITY): Payer: Self-pay

## 2016-07-30 DIAGNOSIS — Z79899 Other long term (current) drug therapy: Secondary | ICD-10-CM | POA: Insufficient documentation

## 2016-07-30 DIAGNOSIS — Z85828 Personal history of other malignant neoplasm of skin: Secondary | ICD-10-CM | POA: Insufficient documentation

## 2016-07-30 DIAGNOSIS — R002 Palpitations: Secondary | ICD-10-CM | POA: Diagnosis present

## 2016-07-30 DIAGNOSIS — I493 Ventricular premature depolarization: Secondary | ICD-10-CM | POA: Insufficient documentation

## 2016-07-30 DIAGNOSIS — Z7982 Long term (current) use of aspirin: Secondary | ICD-10-CM | POA: Insufficient documentation

## 2016-07-30 DIAGNOSIS — Z87891 Personal history of nicotine dependence: Secondary | ICD-10-CM | POA: Diagnosis not present

## 2016-07-30 LAB — CBC
HCT: 40.1 % (ref 36.0–46.0)
HEMOGLOBIN: 13.3 g/dL (ref 12.0–15.0)
MCH: 28.8 pg (ref 26.0–34.0)
MCHC: 33.2 g/dL (ref 30.0–36.0)
MCV: 86.8 fL (ref 78.0–100.0)
PLATELETS: 251 10*3/uL (ref 150–400)
RBC: 4.62 MIL/uL (ref 3.87–5.11)
RDW: 13.5 % (ref 11.5–15.5)
WBC: 7.3 10*3/uL (ref 4.0–10.5)

## 2016-07-30 LAB — BASIC METABOLIC PANEL
ANION GAP: 9 (ref 5–15)
BUN: 23 mg/dL — ABNORMAL HIGH (ref 6–20)
CHLORIDE: 101 mmol/L (ref 101–111)
CO2: 28 mmol/L (ref 22–32)
CREATININE: 0.88 mg/dL (ref 0.44–1.00)
Calcium: 9.6 mg/dL (ref 8.9–10.3)
GFR calc non Af Amer: >60 mL/min (ref >60)
Glucose, Bld: 97 mg/dL (ref 65–99)
POTASSIUM: 4.1 mmol/L (ref 3.5–5.1)
SODIUM: 138 mmol/L (ref 135–145)

## 2016-07-30 LAB — I-STAT TROPONIN, ED: Troponin i, poc: 0 ng/mL (ref 0.00–0.08)

## 2016-07-30 NOTE — ED Provider Notes (Signed)
[facility] DEPT Provider Note   CSN: [id number] Arrival date & time: 07/30/16  1259     History   Chief Complaint Chief Complaint  Patient presents with  . Shortness of Breath    HPI Hailey Fox is a 76 y.o. female.  She presents for evaluation of palpitations which started yesterday.  She has periods when it is there and periods when it is not.  She denies chest pain, back pain, abdominal pain, weakness, dizziness, nausea, vomiting, fever, chills, cough.  Similar problems in the past.  Recently, she has been feeling fatigued, and subsequently saw a cardiologist.  They are planning on doing outpatient evaluation with cardiac echo and stress testing.  These tests will be done within the next 3 weeks.  There are no other known modifying factors.  HPI  Past Medical History:  Diagnosis Date  . Allergy   . Arthritis   . Cancer ([place])    3 skin cancers removed. 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 Cataract    bil  . Complication of anesthesia    slow to wake up  . Dysrhythmia    PAC's   . GERD (gastroesophageal reflux disease)   . Osteoporosis   . PONV (postoperative nausea and vomiting)     Patient Active Problem List   Diagnosis Date Noted  . Degenerative tear of medial meniscus of right knee 07/26/2016  . Left knee pain 07/10/2016  . Chronic heel pain, left 03/28/2016  . 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 Multiple allergies 04/11/2015  . GERD (gastroesophageal reflux disease) 04/11/2015  . Left inguinal hernia 05/26/2012    Past Surgical History:  Procedure Laterality Date  . APPENDECTOMY  early 66's  . BREAST SURGERY Right 2003   palpiloma  . HERNIA REPAIR    . INGUINAL HERNIA REPAIR Left 08/18/2012   Procedure: LEFT INGUINAL HERNIA REPAIR;  Surgeon: Haywood Lasso, MD;  Location: [facility];  Service: General;  Laterality: Left;  . PARATHYROIDECTOMY  [id number]  . TONSILLECTOMY  1945  . TUBAL LIGATION  early 80's    OB History    No data available        Home Medications    Prior to Admission medications   Medication Sig Start Date End Date Taking? Authorizing Provider  aspirin 81 MG tablet Take 81 mg by mouth daily.    [provider]  atorvastatin (LIPITOR) 10 MG tablet Take 10 mg by mouth daily.    [provider]  atorvastatin (LIPITOR) 10 MG tablet TAKE 1 TABLET BY MOUTH AT BEDTIME 05/28/16   [provider]  azelastine (ASTELIN) 137 MCG/SPRAY nasal spray Place 1 spray into the nose 2 (two) times daily.  05/04/12   [provider]  chlorpheniramine (CHLOR-TRIMETON) 4 MG tablet Take 4 mg by mouth 2 (two) times daily as needed for allergies.    [provider]  Cholecalciferol (VITAMIN D-3 PO) Take 2,000 mg by mouth daily.    [provider]  Coenzyme Q10-Levocarnitine 100-20 MG CAPS Take 1 capsule by mouth daily.     [provider]  Coenzyme Q10-Levocarnitine 100-20 MG CAPS Take by mouth.    [provider]  Cyanocobalamin (VITAMIN B-12) 1000 MCG SUBL Take by mouth.    [provider]  ibuprofen (ADVIL,MOTRIN) 200 MG tablet Take 200 mg by mouth every 6 (six) hours as needed for headache.     [provider]  KRILL OIL PO Take 1 tablet by mouth daily.  [provider]    Family History Family History  Problem Relation Age of Onset  . Cancer Paternal Aunt        breast    Social History Social History  Substance Use Topics  . Smoking status: Former Smoker    Years: 15.00  . Smokeless tobacco: Never Used     Comment: quit smoking 1970's  . Alcohol use 1.8 oz/week    3 Glasses of wine per week     Comment: rare     Allergies   Codeine; Contrast media [iodinated diagnostic agents]; Macrodantin [nitrofurantoin macrocrystal]; Ciprofloxacin; and Iohexol   Review of Systems Review of Systems  All other systems reviewed and are negative.    Physical Exam Updated Vital Signs BP (!) 168/86   Pulse (!) 57   Temp 98.2 F  (36.8 C) (Oral)   Resp (!) 22   Ht 5\' 3"  (1.6 m)   Wt 62.6 kg (138 lb)   SpO2 97%   BMI 24.45 kg/m   Physical Exam  Constitutional: She is oriented to person, place, and time. She appears well-developed and well-nourished. No distress.  HENT:  Head: Normocephalic and atraumatic.  Eyes: Conjunctivae and EOM are normal. Pupils are equal, round, and reactive to light.  Neck: Normal range of motion and phonation normal. Neck supple.  Cardiovascular: Normal rate and regular rhythm.   Occasional PVCs on cardiac monitor.  They appear unifocal, and there are no couplets or runs.  Pulmonary/Chest: Effort normal and breath sounds normal. She exhibits no tenderness.  Abdominal: Soft. She exhibits no distension. There is no tenderness. There is no guarding.  Musculoskeletal: Normal range of motion.  Neurological: She is alert and oriented to person, place, and time. She exhibits normal muscle tone.  Skin: Skin is warm and dry.  Psychiatric: She has a normal mood and affect. Her behavior is normal. Judgment and thought content normal.  Nursing note and vitals reviewed.    ED Treatments / Results  Labs (all labs ordered are listed, but only abnormal results are displayed) Labs Reviewed  BASIC METABOLIC PANEL - Abnormal; Notable for the following:       Result Value   BUN 23 (*)    All other components within normal limits  CBC  I-STAT TROPOININ, ED    EKG  EKG Interpretation  Date/Time:  Tuesday Jul 30 2016 13:05:13 EDT Ventricular Rate:  59 PR Interval:  210 QRS Duration: 90 QT Interval:  420 QTC Calculation: 415 R Axis:   78 Text Interpretation:  Sinus bradycardia with sinus arrhythmia with 1st degree A-V block with occasional Premature ventricular complexes Otherwise normal ECG since last tracing no significant change Confirmed by Daleen Bo [phone]) on 07/30/2016 3:54:37 PM       Radiology Dg Chest 2 View  Result Date: 07/30/2016 CLINICAL DATA:  Chest tightness EXAM:  CHEST  2 VIEW COMPARISON:  April 03, 2012 FINDINGS: There is no edema or consolidation. Heart size and pulmonary vascularity are normal. No adenopathy. There is aortic atherosclerosis. There is upper thoracic levoscoliosis. There are scattered foci of degenerative change in the thoracic spine. IMPRESSION: No edema or consolidation.  Foci of aortic atherosclerosis. Electronically Signed   By: Lowella Grip III M.D.   On: 07/30/2016 13:59    Procedures Procedures (including critical care time)  Medications Ordered in ED Medications - No data to display   Initial Impression / Assessment and Plan / ED Course  I have reviewed the triage vital signs  and the nursing notes.  Pertinent labs & imaging results that were available during my care of the patient were reviewed by me and considered in my medical decision making (see chart for details).      Patient Vitals for the past 24 hrs:  BP Temp Temp src Pulse Resp SpO2 Height Weight  07/30/16 1821 (!) 168/86 - - (!) 57 (!) 22 97 % - -  07/30/16 1700 134/82 - - (!) 57 17 96 % - -  07/30/16 1630 (!) 154/76 - - (!) 55 19 98 % - -  07/30/16 1542 - - - 68 - 98 % - -  07/30/16 1541 (!) 178/92 - - - - - - -  07/30/16 1525 (!) 154/73 - - 60 16 100 % - -  07/30/16 1304 - - - - - - 5\' 3"  (1.6 m) 62.6 kg (138 lb)  07/30/16 1303 (!) 194/105 98.2 F (36.8 C) Oral 69 18 98 % - -    At discharge- reevaluation with update and discussion. After initial assessment and treatment, an updated evaluation reveals no change in clinical status.  Findings discussed with patient and family members, all questions answered. Elye Harmsen L    Final Clinical Impressions(s) / ED Diagnoses   Final diagnoses:  Premature ventricular contractions (PVCs) (VPCs)  Palpitations    Patient presenting for evaluation of palpitations, and appears to be having symptomatic PVCs.  No history of near syncope, syncope; or evidence for serious infection, metabolic instability  or impending vascular collapse.  Nursing Notes Reviewed/ Care Coordinated Applicable Imaging Reviewed Interpretation of Laboratory Data incorporated into ED treatment  The patient appears reasonably screened and/or stabilized for discharge and I doubt any other medical condition or other [facility] requiring further screening, evaluation, or treatment in the ED at this time prior to discharge.  Plan: Home Medications-continue usual; Home Treatments-rest, fluids, hydration; return here if the recommended treatment, does not improve the symptoms; Recommended follow up-PCP, as needed   New Prescriptions Discharge Medication List as of 07/30/2016  6:16 PM       Daleen Bo, MD 07/30/16 [phone]

## 2016-07-30 NOTE — ED Triage Notes (Signed)
Per Pt, Pt is coming from home with complaints of "fluttering" in her chest when she woke up in the middle of the night. This morning, pt reports that she has had some SOB, Chest tightness and lightheadedness. Denies N/V/D

## 2016-07-30 NOTE — Discharge Instructions (Signed)
Make sure you are taking her medicines regularly and eating and drinking well.  Follow through with the cardiology testing which is planned.  Return here, if needed, for problems.

## 2016-08-27 ENCOUNTER — Encounter: Payer: Self-pay | Admitting: Sports Medicine

## 2016-08-27 ENCOUNTER — Ambulatory Visit (INDEPENDENT_AMBULATORY_CARE_PROVIDER_SITE_OTHER): Payer: Medicare Other | Admitting: Sports Medicine

## 2016-08-27 ENCOUNTER — Ambulatory Visit: Payer: Self-pay

## 2016-08-27 VITALS — BP 150/80 | Ht 63.0 in | Wt 138.0 lb

## 2016-08-27 DIAGNOSIS — M23203 Derangement of unspecified medial meniscus due to old tear or injury, right knee: Secondary | ICD-10-CM

## 2016-08-27 DIAGNOSIS — M25562 Pain in left knee: Secondary | ICD-10-CM

## 2016-08-27 NOTE — Patient Instructions (Signed)
Thank you so much for coming to visit today! You may start working on exercises to improve your stability. Please do chair squats and then the exercises starred in the handout given. Please use your compression sleeve with elevation of your legs for 72minutes daily. You may consider support stockings if you continue to note lower leg swelling. Please return in 43months.

## 2016-08-27 NOTE — Assessment & Plan Note (Signed)
With continued improvement in sxs and smalelr effusion I think we are going right direction  Keep up HEP  Icing  Can repeat CSI in 2 mos. If needed

## 2016-08-27 NOTE — Progress Notes (Signed)
   Subjective:    Patient ID: Hailey Fox, female    DOB: April 01, 1940, 76 y.o.   MRN: [id number]  HPI Hailey Fox is a 76 year old female presenting today for follow-up of left knee pain. Last office visit 07/25/2016 with left knee pain. Ultrasound at that time showed suprapatellar pouch effusion with mild degeneration of the medial meniscus and loose body noted over the medial meniscus. Injection of the left knee was given and she is instructed to use body helix on the left knee. Reports some improvement since last office visit. Believes the injection did help her significantly. Continues to note infrequent sharp pain and occasional dull ache, but does have periods where she is pain-free. Reports giving out and some instability in her left knee.   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 Notes occasional swelling. Has tried to wear the body helix, but reports it caused increased ankle edema and so she stopped wearing this. Is able to walk further now, reporting she walked further yesterday than she had for some time (~54mi).  Review of Systems Per HPI    Objective:   Physical Exam  Constitutional: She appears well-developed and well-nourished. No distress.  Cardiovascular: Normal rate.   Pulmonary/Chest: Effort normal. No respiratory distress.  Musculoskeletal:  Mild effusion of left knee. Full extension bilaterally. Right knee with 160 flexion, left 140. No joint line tenderness noted. No patellar or quad tendon tenderness. Crepitus noted on left. Negative bounce home. Anterior cruciate ligament, MCL, PCL, LCL intact and symmetric bilaterally. Positive Thessaly's on left.    ultrasound: Complete ultrasound of left knee obtained.   Continues to note area of suprapatellar pouch effusion, improved from last exam.  Degeneration medial meniscus noted with loose body, but effusion around medial meniscus improved from last  exam.  Baker's cyst significantly smaller and may have resolved  Quad tendon and patella tendon intact. Lateral meniscus normal.  Impression - degenerative meniscus injury with smaller effusion on Ultrasound  Ultrasound and interpretation by Wolfgang Phoenix. Fields, MD      Assessment & Plan:  1. Acute pain of left knee secondary to medial meniscus degeneration and loose body. Effusion improved. Recommend body helix 30 minutes daily with leg elevation. May also consider compression stockings of lower extremity edema continues to be a problem. Knee exercises given to provide increased stability, including chair squats, leg lifts, lateral leg lifts. Follow-up in 2 months for next ultrasound.

## 2016-10-29 ENCOUNTER — Ambulatory Visit (INDEPENDENT_AMBULATORY_CARE_PROVIDER_SITE_OTHER): Payer: Medicare Other | Admitting: Sports Medicine

## 2016-10-29 DIAGNOSIS — M23203 Derangement of unspecified medial meniscus due to old tear or injury, right knee: Secondary | ICD-10-CM | POA: Diagnosis not present

## 2016-10-29 NOTE — Patient Instructions (Signed)
Wrap area above knee when walking  Take 1 aleve daily  Vitamin C or orange daily will help with varicosities  Cont. Vitamin D  Ice above the knee each evening  See how this goes

## 2016-10-29 NOTE — Progress Notes (Signed)
CC; Left Knee Pain  Patient seen May 24 with a degenerative tear of her medial meniscus Steroid injection helped significantly for the next 6 weeks Even now her pain is about 50% less If she is too active she gets some knee pain primarily at night If she bends the knee too far she gets pain If she has to stand for prolonged periods of time walk down stairs she gets some pain Compression sleeve caused ankle swelling 1 Aleve often helps the pain  Past medical history Parathyroidectomy Bone density suggesting osteoporosis Takes vitamin D but not able to take calcium  Review of systems Minimal swelling No locking or giving way No warmth or redness  Physical exam Pleasant older female in no acute distress BP 118/70   Ht 5' 2.5" (1.588 m)   Wt 135 lb (61.2 kg)   BMI 24.30 kg/m   Knee: Left Normal to inspection with no erythema or obvious bony abnormalities. Mild puffiness Palpation normal with no warmth or joint line tenderness or patellar tenderness or condyle tenderness. ROM normal in flexion and extension and lower leg rotation. Left knee flexion she does get some pain at 140 an old the right it is pain free to 160 Ligaments with solid consistent endpoints including ACL, PCL, LCL, MCL. Negative Mcmurray's and provocative meniscal tests. Non painful patellar compression. Patellar and quadriceps tendons unremarkable. Hamstring and quadriceps strength is normal.  Ultrasound of left knee There is a much smaller effusion Mild degenerative change at the medial meniscus Lateral meniscus unremarkable Patellar and quad tendons intact  Impression - small knee effusion with some mild degenerative changes  Ultrasound and interpretation by Wolfgang Phoenix. Kurstyn Larios, MD  X-rays reviewed and show some mild osteopenia and very mild osteoarthritis

## 2016-10-29 NOTE — Assessment & Plan Note (Signed)
He has improved overall Degenerative meniscal tear is less impressive and less swollen Suspect there is underlying mild osteoarthritis Some pain not related to osteopenia Not able to use calcium because of a history of parathyroidism  I suggested continuing a maintenance exercise program Use Aleve approximately once daily Stop baby aspirin while doing this  Ace wrap over the suprapatellar pouch to lessen swelling  Icing in the evening  Recheck q. 3-4 months

## 2017-02-04 ENCOUNTER — Ambulatory Visit: Payer: Medicare Other | Admitting: Sports Medicine

## 2017-02-04 ENCOUNTER — Encounter: Payer: Self-pay | Admitting: Sports Medicine

## 2017-02-04 DIAGNOSIS — G5701 Lesion of sciatic nerve, right lower limb: Secondary | ICD-10-CM

## 2017-02-04 NOTE — Assessment & Plan Note (Signed)
X leg stretch 5 x 5 breaths  Lat leg lifts Rotated lat step up (unable at 8 inches so start low) Hip rotation  If not better with stretch and exercise try amitriptyline  Reck 6 wks prn

## 2017-02-04 NOTE — Progress Notes (Signed)
CC: RT sided sciatica  Patient is still active doing yoga She developed pain after walking > 2 miles looking for lost hearing aid Now hurts to sit "[place] style" Pain radiates down side of RT leg to calf and foot Some pain in buttocks Not much LBP  ROS Denies weakness in leg No cough or sneeze pain No pain with bending or back motion  PE Pleasant older F in NAD  SLR neg Flex and extenson of back is excellent and causes no pain Normal lower leg strength  TTP over piriformis X leg stretch increases pain Hip abduction strength is weak only on RT Mild pain at [employer]

## 2017-02-13 ENCOUNTER — Ambulatory Visit: Payer: Self-pay

## 2017-02-13 ENCOUNTER — Ambulatory Visit: Payer: Medicare Other | Admitting: Sports Medicine

## 2017-02-13 ENCOUNTER — Encounter: Payer: Self-pay | Admitting: Sports Medicine

## 2017-02-13 VITALS — BP 144/59 | Ht 63.0 in | Wt 138.0 lb

## 2017-02-13 DIAGNOSIS — M25562 Pain in left knee: Secondary | ICD-10-CM | POA: Diagnosis not present

## 2017-02-13 DIAGNOSIS — M23307 Other meniscus derangements, unspecified meniscus, left knee: Secondary | ICD-10-CM | POA: Insufficient documentation

## 2017-02-13 NOTE — Assessment & Plan Note (Signed)
Patient is here with signs and symptoms concerning for acute aggravation of degenerative meniscus tear within the left knee.  Positive effusion noted on ultrasound.  Lateral joint space loss with osteophyte development present as well as outward protrusion of the lateral meniscus from the joint space itself.  We will treat conservatively -Encouraged ice, compression, elevation. (Remove compression if stationary for a long period of time) -Continue home exercise program with quadricep and hip abductor strengthening as tolerated. -Follow-up as needed  Next: Offered intra-articular steroid injection today.  Patient declined.  If pain persists or worsens this would be likely appropriate to offer for some symptomatic relief.

## 2017-02-13 NOTE — Progress Notes (Signed)
HPI  CC: Left Knee Pain/Injury, Acute, Atraumatic Patient is here 2 days after suffering a acute onset knee injury of her left knee.  She states that 2 days ago she had been walking down her stairs when she had an audible "pop" and immediate pain in her left knee.  She states that she had not suffered any fall or misstep while using the stairs.  There was no twisting motion.  The affected knee was in direct contact with the stairs and was currently bending to allow her other foot to meet the stair below.  She states that she has had issues with this knee over the past year but this is more significant than it had been in the past.  She states that it feels as though the knee is swollen.  Tenderness is noted along the anterior medial aspect of the knee as well as the posterior lateral aspect.  She denies any weakness, numbness, or feelings of instability.  She endorses a feeling of stiffness and significant pain with trying to fully extend the knee for about 24 hours after the injury.  This pain has since subsided and she is able to fully extend the knee.  Medications/Interventions Tried: Rest  See HPI and/or previous note for associated ROS.  Objective: BP (!) 144/59   Ht 5\' 3"  (1.6 m)   Wt 138 lb (62.6 kg)   BMI 24.45 kg/m  Gen: NAD, well groomed, a/o x3, normal affect.  CV: Well-perfused. Warm.  Resp: Non-labored.  Neuro: Sensation intact throughout. No gross coordination deficits.  Gait: Nonpathologic posture, unremarkable stride without signs of limp or balance issues. Knee, Left: TTP noted at the anterior medial joint line and posterior lateral joint line. Inspection was negative for erythema, ecchymosis, and effusion. No obvious bony abnormalities. Palpation yielded no asymmetric warmth; No patellar tenderness; No patellar crepitus. Patellar and quadriceps tendons unremarkable, and no tenderness of the pes anserine bursa. No obvious Baker's cyst development. ROM normal in flexion (135  degrees) and extension (0 degrees). Normal hamstring and quadriceps strength. Neurovascularly intact bilaterally.  - Ligaments: (Solid and consistent endpoints)   - ACL (present bilaterally)   - PCL (present bilaterally)   - LCL (present bilaterally)   - MCL (present bilaterally).   - Patella:   - Patellar grind/compression: NEG   - Patellar glide: Without apprehension  ULTRASOUND: Knee, Left Diagnostic limited ultrasound imaging obtained of patient's left knee.  - Quadriceps tendon: No appreciated signs of tearing, edema, or calcification. Moderate effusion within the suprapatellar pouch.  - Patellar tendon: No appreciated signs of tearing, edema, or calcification. No infrapatellar or tibial tuberosity fluid or abnormality appreciated.  - Medial joint line: Obvious degenerative meniscal splitting appreciated. No increased fluid presence noted. Joint space loss within anticipated severity per patient's age. - Lateral joint line: Significant joint space loss with meniscus disruption and outward projection from the joint space itself.  Evidence of degenerative meniscal splitting present.  Mildly increased fluid presence noted.  - Popliteal fossa: No evidence of Baker's cyst formation. Vasculature unremarkable. - MCL: No evidence of integrity loss or abnormal fluid presence.  - LCL: No evidence of integrity loss or abnormal fluid presence.  - Pes Anserine: No evidence of integrity loss or abnormal fluid presence.    Assessment and plan:  Degenerative tear of meniscus, left Patient is here with signs and symptoms concerning for acute aggravation of degenerative meniscus tear within the left knee.  Positive effusion noted on ultrasound.  Lateral joint  space loss with osteophyte development present as well as outward protrusion of the lateral meniscus from the joint space itself.  We will treat conservatively -Encouraged ice, compression, elevation. (Remove compression if stationary for a long  period of time) -Continue home exercise program with quadricep and hip abductor strengthening as tolerated. -Follow-up as needed  Next: Offered intra-articular steroid injection today.  Patient declined.  If pain persists or worsens this would be likely appropriate to offer for some symptomatic relief.   Orders Placed This Encounter  Procedures  . [place] COMPLETE JOINT SPACE STRUCTURES LOW LEFT    Standing Status:   Future    Number of Occurrences:   1    Standing Expiration Date:   04/16/2018    Order Specific Question:   Reason for Exam (SYMPTOM  OR DIAGNOSIS REQUIRED)    Answer:   left knee pain    Order Specific Question:   Preferred imaging location?    Answer:   Internal    Elberta Leatherwood, MD,MS [facility] Fellow 02/13/2017 12:57 PM

## 2017-02-18 ENCOUNTER — Ambulatory Visit: Payer: Medicare Other | Admitting: Sports Medicine

## 2017-03-10 ENCOUNTER — Encounter (INDEPENDENT_AMBULATORY_CARE_PROVIDER_SITE_OTHER): Payer: Self-pay

## 2017-03-11 ENCOUNTER — Other Ambulatory Visit: Payer: Self-pay

## 2017-03-11 DIAGNOSIS — M545 Low back pain: Secondary | ICD-10-CM

## 2017-03-18 ENCOUNTER — Ambulatory Visit
Admission: RE | Admit: 2017-03-18 | Discharge: 2017-03-18 | Disposition: A | Discharge status: Home / Self Care | Payer: Medicare Other | Source: Ambulatory Visit | Attending: Sports Medicine | Admitting: Sports Medicine

## 2017-03-18 ENCOUNTER — Encounter: Payer: Self-pay | Admitting: Sports Medicine

## 2017-03-18 ENCOUNTER — Ambulatory Visit (INDEPENDENT_AMBULATORY_CARE_PROVIDER_SITE_OTHER): Payer: Medicare Other | Admitting: Sports Medicine

## 2017-03-18 DIAGNOSIS — M545 Low back pain: Secondary | ICD-10-CM

## 2017-03-18 DIAGNOSIS — M5416 Radiculopathy, lumbar region: Secondary | ICD-10-CM | POA: Diagnosis not present

## 2017-03-18 NOTE — Progress Notes (Signed)
CC: radicular sxs to both legs  Patient is active but having some troubling sxs Radicular sxs that wake her at night To both legs To ankles with cramping like pain No numbness or significant pain during days  Wonders if this relates to some of hip, lower leg pain that she experiences  Past Med Hx Reviewed and no changes noted Non smoker  ROS Periodic LBP but not usually severe/ relieved by Yoga No bowel or bladder change in function No specific weakness or giving way Sleeping on stomarch relieves ankle pain but hurts neck  PE Pleasant older F in NAD BP 126/84   Ht 5\' 3"  (1.6 m)   Wt 135 lb (61.2 kg)   BMI 23.91 kg/m   SLR bilat is negative Tightness on FABER to RT hip Hip ROM is normal and SI movement noted Toe walk Heel walk 1 leg stand All these she did well Reflexes - slt diminished 1+ knee ankle RT/ trace knee and 1+ ankle left No limping  LS Spine film L3/4 shows complete loss of disc height and marked sclerosis Lumbar scoliosis  By my review

## 2017-03-18 NOTE — Assessment & Plan Note (Addendum)
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

## 2017-06-05 ENCOUNTER — Encounter: Payer: Self-pay | Admitting: Sports Medicine

## 2017-06-05 ENCOUNTER — Ambulatory Visit: Payer: Medicare Other | Admitting: Sports Medicine

## 2017-06-05 DIAGNOSIS — M5416 Radiculopathy, lumbar region: Secondary | ICD-10-CM

## 2017-06-05 NOTE — Assessment & Plan Note (Signed)
She is stable with less nighttime pain.     Cont walks but shorter distance   Hip exercises 3 x week  Vit B 6  Prn OTC meds

## 2017-06-05 NOTE — Progress Notes (Signed)
CC:  Radicular pain to left leg  XR significant narrowing and disc space loss at L3-L4 plus lumbar scoliosis Pain since last visit is somewhat less She can walk up to about 31mile with mild pain  hip exercises seem to have helped and does not wish to take any medication  Review of systems No weakness in either leg  no bowel or bladder symptoms Less radicular pain than before   physical examination  Female in no acute distress BP 122/88   Ht 5\' 3"  (1.6 m)   Wt 138 lb (62.6 kg)   BMI 24.45 kg/m   Hip abduction strength is now strong bilaterally Good quadriceps strength Normal rotational motion of both hips  she has some tenderness over the greater trochanter on the right Negative straight leg raise Gait is without a limp    1

## 2017-06-05 NOTE — Patient Instructions (Signed)
Keep up hip exercise - lateral lifts  Moderate walking to 1/2 to 1 mile at a time  Keep up knee flexion to chest for low back  Ice massage to muscles at RT hip  Vit. B6 50 or 100 mg per day  Vit C 500 to 1000 mg each day

## 2017-06-21 ENCOUNTER — Emergency Department (HOSPITAL_COMMUNITY): Payer: Medicare Other

## 2017-06-21 ENCOUNTER — Other Ambulatory Visit: Payer: Self-pay

## 2017-06-21 ENCOUNTER — Encounter (HOSPITAL_COMMUNITY): Payer: Self-pay

## 2017-06-21 ENCOUNTER — Emergency Department (HOSPITAL_COMMUNITY)
Admission: EM | Admit: 2017-06-21 | Discharge: 2017-06-22 | Disposition: A | Discharge status: Home / Self Care | Payer: Medicare Other | Attending: Emergency Medicine | Admitting: Emergency Medicine

## 2017-06-21 DIAGNOSIS — Y92000 Kitchen of unspecified non-institutional (private) residence as  the place of occurrence of the external cause: Secondary | ICD-10-CM | POA: Diagnosis not present

## 2017-06-21 DIAGNOSIS — Z87891 Personal history of nicotine dependence: Secondary | ICD-10-CM | POA: Diagnosis not present

## 2017-06-21 DIAGNOSIS — Y998 Other external cause status: Secondary | ICD-10-CM | POA: Insufficient documentation

## 2017-06-21 DIAGNOSIS — S3992XA Unspecified injury of lower back, initial encounter: Secondary | ICD-10-CM | POA: Diagnosis present

## 2017-06-21 DIAGNOSIS — Z85828 Personal history of other malignant neoplasm of skin: Secondary | ICD-10-CM | POA: Diagnosis not present

## 2017-06-21 DIAGNOSIS — Y9389 Activity, other specified: Secondary | ICD-10-CM | POA: Insufficient documentation

## 2017-06-21 DIAGNOSIS — S0990XA Unspecified injury of head, initial encounter: Secondary | ICD-10-CM | POA: Insufficient documentation

## 2017-06-21 DIAGNOSIS — W01198A Fall on same level from slipping, tripping and stumbling with subsequent striking against other object, initial encounter: Secondary | ICD-10-CM | POA: Insufficient documentation

## 2017-06-21 DIAGNOSIS — Z79899 Other long term (current) drug therapy: Secondary | ICD-10-CM | POA: Diagnosis not present

## 2017-06-21 DIAGNOSIS — S32010A Wedge compression fracture of first lumbar vertebra, initial encounter for closed fracture: Secondary | ICD-10-CM | POA: Diagnosis not present

## 2017-06-21 MED ORDER — ONDANSETRON 4 MG PO TBDP
4.0000 mg | ORAL_TABLET | Freq: Once | ORAL | Status: AC
  Administered 2017-06-22: 4 mg via ORAL
  Filled 2017-06-21: qty 1

## 2017-06-21 MED ORDER — HYDROCODONE-ACETAMINOPHEN 5-325 MG PO TABS
2.0000 | ORAL_TABLET | Freq: Once | ORAL | Status: AC
  Administered 2017-06-22: 2 via ORAL
  Filled 2017-06-21: qty 2

## 2017-06-21 NOTE — ED Triage Notes (Signed)
Pt to ED via [facility] with c/o lower back pain from tripping over dishwasher and hitting her lower back on her counter top this evening. Pain when moves is 10/10, and patient can not sit up. Pt also has anxiety and chronic back problems. Pt states she has no cartilage in L1-L3 and sciatica.   Vitals 147/84 75 97%

## 2017-06-21 NOTE — ED Provider Notes (Signed)
TIME SEEN: 11:13 PM  CHIEF COMPLAINT: fall  HPI: Patient is a 77 year old female with history of osteoporosis who presents to the emergency department after a fall.  States that she tripped over the dishwasher door which was down which caused her to lose her balance, tripped over her shoes and then slide and fall to the floor in her house.  States she did hit her head on the kitchen counter on the way down and states her back struck something as well.  Complaining of lower thoracic, upper lumbar and lower lumbar pain.  Denies chest or abdominal pain.  Denies extremity pain.  Denies numbness, tingling or focal weakness.  Pain worse with movement.  Better with staying still.  ROS: See HPI Constitutional: no fever  Eyes: no drainage  ENT: no runny nose   Cardiovascular:  no chest pain  Resp: no SOB  GI: no vomiting GU: no dysuria Integumentary: no rash  Allergy: no hives  Musculoskeletal: no leg swelling  Neurological: no slurred speech ROS otherwise negative  PAST MEDICAL HISTORY/PAST SURGICAL HISTORY:  Past Medical History:  Diagnosis Date  . Allergy   . Arthritis   . Cancer ([place])    3 skin cancers removed. 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 Cataract    bil  . Complication of anesthesia    slow to wake up  . Dysrhythmia    PAC's   . GERD (gastroesophageal reflux disease)   . Osteoporosis   . PONV (postoperative nausea and vomiting)     MEDICATIONS:  Prior to Admission medications   Medication Sig Start Date End Date Taking? Authorizing Provider  atorvastatin (LIPITOR) 10 MG tablet Take 10 mg by mouth at bedtime.    Yes [provider]  azelastine (ASTELIN) 137 MCG/SPRAY nasal spray Place 1 spray into the nose 2 (two) times daily.  05/04/12  Yes [provider]  chlorpheniramine (CHLOR-TRIMETON) 4 MG tablet Take 4 mg by mouth 2 (two) times daily as needed for allergies.   Yes [provider]  Cholecalciferol (VITAMIN D-3 PO) Take 2,000 mg by mouth daily.    Yes [provider]  Coenzyme Q10 (COQ10 PO) Take 10 mLs by mouth daily after breakfast.   Yes [provider]  KRILL OIL PO Take 1 tablet by mouth daily.   Yes [provider]  naproxen sodium (ALEVE) 220 MG tablet Take 220 mg by mouth 2 (two) times daily as needed (pain).    Yes [provider]  pyridOXINE (VITAMIN B-6) 100 MG tablet Take 100 mg by mouth daily.   Yes [provider]  vitamin C (ASCORBIC ACID) 500 MG tablet Take 500 mg by mouth daily.   Yes [provider]    ALLERGIES:  Allergies  Allergen Reactions  . Codeine     blackouts  . Contrast Media [Iodinated Diagnostic Agents] Hives  . Macrodantin [Nitrofurantoin Macrocrystal]   . Ciprofloxacin     Other reaction(s): Tachycardia / Palpitations  (intolerance) Rapid pulse  . Iohexol      Desc: IMMEDIATE DEVELOPMENT OF HIVES POST INJECTION     SOCIAL HISTORY:  Social History   Tobacco Use  . Smoking status: Former Smoker    Years: 15.00  . Smokeless tobacco: Never Used  . Tobacco comment: quit smoking 1970's  Substance Use Topics  . Alcohol use: Yes    Alcohol/week: 1.8 oz    Types: 3 Glasses of wine per week    Comment: rare  FAMILY HISTORY: Family History  Problem Relation Age of Onset  . Cancer Paternal Aunt        breast    EXAM: BP 138/71 (BP Location: Right Arm)   Pulse 75   Temp 98.5 F (36.9 C) (Oral)   Resp 15   Ht 5\' 3"  (1.6 m)   Wt 62.6 kg (138 lb)   SpO2 98%   BMI 24.45 kg/m  CONSTITUTIONAL: Alert and oriented and responds appropriately to questions.  Elderly, appears uncomfortable with movement, GCS 15 HEAD: Normocephalic; atraumatic EYES: Conjunctivae clear, PERRL, EOMI ENT: normal nose; no rhinorrhea; moist mucous membranes; pharynx without lesions noted; no dental injury; no septal hematoma NECK: Supple, no meningismus, no LAD; no midline spinal tenderness, step-off or deformity; trachea midline CARD: RRR; S1 and S2  appreciated; no murmurs, no clicks, no rubs, no gallops RESP: Normal chest excursion without splinting or tachypnea; breath sounds clear and equal bilaterally; no wheezes, no rhonchi, no rales; no hypoxia or respiratory distress CHEST:  chest wall stable, no crepitus or ecchymosis or deformity, nontender to palpation; no flail chest ABD/GI: Normal bowel sounds; non-distended; soft, non-tender, no rebound, no guarding; no ecchymosis or other lesions noted PELVIS:  stable, nontender to palpation BACK:  The back appears normal and is tender over the lower thoracic and upper lumbar spine as well as lower lumbar spine without step-off or deformity, no lesions noted to the back, there is no CVA tenderness EXT: Normal ROM in all joints; non-tender to palpation; no edema; normal capillary refill; no cyanosis, no bony tenderness or bony deformity of patient's extremities, no joint effusion, compartments are soft, extremities are warm and well-perfused, no ecchymosis SKIN: Normal color for age and race; warm NEURO: Moves all extremities equally, normal sensation diffusely, cranial nerves II through XII intact, normal speech PSYCH: The patient's mood and manner are appropriate. Grooming and personal hygiene are appropriate.  MEDICAL DECISION MAKING: Patient here with mechanical fall.  No obvious sign of trauma on examination.  She is hemodynamically stable and neurologically intact.  Given she is elderly, will obtain CT of her head and cervical spine given she did hit her head.  Also obtain x-rays of her thoracic and lumbar spine.  Will give pain and nausea medication.  ED PROGRESS: CT head and cervical spine showed no acute abnormality.  There is an age-indeterminate mild superior endplate deformity at T1.  She is not having any tenderness at this area today and I suspect that this is likely old.  She does have a new mild compression deformity of the superior endplate of the vertebral body of L1 concerning for an  acute fracture with no retropulsion.  Will place her in a TLSO brace and see if she can ambulate in the emergency department.    TLSO brace is in place.  Patient has been able to ambulate reports her pain has been very well controlled with just Vicodin.  I feel she is safe to be discharged with Vicodin, Zofran and Colace and have her follow-up with her PCP.  Discussed return precautions.  Recommend she wear the brace at all times for the next 4 weeks.   At this time, I do not feel there is any life-threatening condition present. I have reviewed and discussed all results (EKG, imaging, lab, urine as appropriate) and exam findings with patient/family. I have reviewed nursing notes and appropriate previous records.  I feel the patient is safe to be discharged home without further emergent workup and can continue  workup as an outpatient as needed. Discussed usual and customary return precautions. Patient/family verbalize understanding and are comfortable with this plan.  Outpatient follow-up has been provided if needed. All questions have been answered.    Gal Feldhaus, Delice Bison, DO 06/22/17 [phone]

## 2017-06-21 NOTE — ED Notes (Signed)
Bed: TZ00 Expected date:  Expected time:  Means of arrival:  Comments: EMS 77 yo female fell back pain

## 2017-06-22 ENCOUNTER — Emergency Department (HOSPITAL_COMMUNITY): Payer: Medicare Other

## 2017-06-22 MED ORDER — HYDROCODONE-ACETAMINOPHEN 5-325 MG PO TABS
1.0000 | ORAL_TABLET | Freq: Once | ORAL | Status: AC
  Administered 2017-06-22: 1 via ORAL
  Filled 2017-06-22: qty 1

## 2017-06-22 MED ORDER — DOCUSATE SODIUM 100 MG PO CAPS: 100.0000 mg | ORAL_CAPSULE | Freq: Two times a day (BID) | ORAL | 0 refills | Status: DC

## 2017-06-22 MED ORDER — ONDANSETRON 4 MG PO TBDP: 4.0000 mg | ORAL_TABLET | Freq: Three times a day (TID) | ORAL | 0 refills | Status: DC | PRN

## 2017-06-22 MED ORDER — HYDROCODONE-ACETAMINOPHEN 5-325 MG PO TABS: 2.0000 | ORAL_TABLET | Freq: Four times a day (QID) | ORAL | 0 refills | Status: DC | PRN

## 2017-06-22 NOTE — ED Notes (Signed)
Talked to [place], should be here within 1.5 hours to place brace

## 2017-06-22 NOTE — ED Notes (Signed)
[facility] notified of need for transport home-patient uses a walker at home-currently eith back brace-needs 2 staff to assist patient with ambulation

## 2017-06-22 NOTE — ED Notes (Signed)
[facility] here to transport patient home.  

## 2017-06-25 ENCOUNTER — Ambulatory Visit: Payer: Medicare Other | Admitting: Family Medicine

## 2017-06-25 VITALS — BP 163/70 | Ht 63.0 in | Wt 138.0 lb

## 2017-06-25 DIAGNOSIS — M4850XA Collapsed vertebra, not elsewhere classified, site unspecified, initial encounter for fracture: Secondary | ICD-10-CM | POA: Diagnosis not present

## 2017-06-25 DIAGNOSIS — M545 Low back pain: Secondary | ICD-10-CM

## 2017-06-25 DIAGNOSIS — M810 Age-related osteoporosis without current pathological fracture: Secondary | ICD-10-CM

## 2017-06-25 MED ORDER — TRAMADOL HCL 50 MG PO TABS: 50.0000 mg | ORAL_TABLET | Freq: Four times a day (QID) | ORAL | 0 refills | Status: DC | PRN

## 2017-06-25 MED ORDER — HYDROCODONE-ACETAMINOPHEN 5-325 MG PO TABS: ORAL_TABLET | ORAL | 0 refills | Status: DC

## 2017-06-25 NOTE — Progress Notes (Signed)
Chief complaint: Acute low back pain  History of present illness: Hailey Fox is a 77 year old female who presents to the sports medicine office today, accompanied by her husband, with chief complaint of acute low back pain.  Unfortunately, 4 days ago she suffered a fall at home.  She reports that she left the dishwasher open and while walking in the kitchen she stumbled over the dishwasher, tried to catch her balance, but unfortunately could not and fell backwards and hit her head and her back on the kitchen counter.  She reports having immediate onset of pain in her lower back and was not able to stand back up.  EMS was called she was brought to the emergency department for evaluation.  She does not report of any numbness, tingling, or burning sensations in either lower extremity. She does not report of any bowel or bladder incontinence, no saddle anesthesia.  Quite the opposite, she reports that she is more constipated currently. At the emergency department, x-ray of her lumbar spine was done, which does show a new, acute L1 compression fracture at the superior endplate of the vertebral body of L1.  This was not seen on her most recent x-ray of her lumbar spine back in January 2019.  She reports of sharp shooting pain more so on the lower back and in the right flank, but not radiating down either leg. She was fitted with a TLSO brace in the ED was prescribed Norco 5-325 mg to take every 6 hours as needed for pain.  She was given 20 tablets.  She is here today because she is essentially out of medication and needs refill of pain medications today.  She reports that her primary physician was out of town and she was unable to get pain medications through her primary care physician's office.  She reports that the TLSO brace has made her comfortable.  In regards to pain, she reports that she has to take 2 tablets of the Norco every 6 hours due to the extreme pain.  She reports that 1 tablet is not cutting it for her.   She reports symptoms have been the same over the last 4 days.  She has kept the TLSO brace on at all times.  No new or worsening symptoms. It does wake her up from sleep at nighttime, specifically when she moves.  No fevers, chills, night sweats, or any unintentional weight loss.  As noted above, she reports feeling constipated and has had decreased appetite.  She was prescribed Colace in the emergency department. She does have known history of osteoporosis, but is adamant about not taking any type of bisphosphonates due to adverse side effect when taking Fosamax several years ago.   Review of systems:  As stated above  Her past medical history, surgical history, family history, and social history obtained and reviewed.  Her past medical history is notable for hyperparathyroidism s/p parathyroidectomy, GERD, osteoporosis, dysrhythmia, skin cancer, and cataracts; surgical history notable for inguinal hernia repair, breast surgery, tonsillectomy, appendectomy, parathyroidectomy, and tubal ligation; family history notable for breast cancer; she does not report of any current tobacco use; allergies and medications are reviewed and are reflected in EMR.  Physical exam: Vital signs are reviewed and are documented in the chart Gen.: Alert, oriented, appears stated age, in no apparent distress; exam limited due to her wearing TLSO brace and requesting not to take it off and be seated for the exam HEENT: Moist oral mucosa Respiratory: Normal respirations, able to speak in  full sentences Cardiac: Regular rate, distal pulses 2+ Integumentary: No rashes on visible skin:  Neurologic: Strength 5/5 with knee flexion, knee extension, and hip flexion on both sides, as well as ankle dorsiflexion and plantar flexion on both sides, no dropfoot or antalgic gait with ambulation, sensation 2+ in bilateral lower extremities, DTRs symmetric and intact in bilateral lower extremities Psych: Normal affect, mood is described as  good Musculoskeletal: Unable to fully ascertain any specific tenderness in her lumbar spine secondary to wearing the TLSO brace and requesting not to have this be removed during the examination today, pressing above and trying to reach underneath the brace to palpate her back, I do not feel any obvious deformity or bony step-offs, no overlying warmth, erythema, ecchymosis noted, no CVA tenderness bilaterally  Assessment and plan: 1.  Acute low back pain status post fall 4 days ago, with x-ray of her lumbar spine showing a new L1 endplate compression fracture 2.  History of lumbar degenerative disc disease, with previous symptoms of radiculopathy earlier this year 3.  History of osteoporosis  Plan: Unfortunately, Evette has developed a new L1 endplate compression fracture.  Fortunately, it is not significant enough to warrant urgent referral to orthopedic spine surgeon or neurosurgeon for consideration of kyphoplasty.  She is not having any neurological symptoms or deficits, which is also reassuring.  Given that she is very comfortable with the TLSO brace, will have her continue to use this.  Unfortunately, her pain medication is causing her to be very constipated, even with using Colace.  I am concerned that she is taking a lot of pain medication, as she is taking 2 tablets of Norco every 6 hours.  She reports that she does feel a little bit foggy secondary to taking these pain medications.  Due to the symptoms and what she is describing, I do feel that the next best step would be to change her pain medication to tramadol.  She reports being on tramadol in the past, thinks it may have helped her in the past.  Will have her started on tramadol 50 mg every 8 hours as needed for pain.  I did give her a pocket prescription for Norco, giving her 12 tablets in the event that the tramadol does not provide her the pain relief that she is looking for.  I did make it very clear to her not to take both medications at  the same time, only take one of these medications during the day.  She does need to return to the office in 2 weeks for follow-up, with repeat x-rays.  Discussed to have the x-ray done the day prior to coming to the office.  Since she normally sees Dr. Oneida Alar, will have her schedule appointment with Dr. Oneida Alar.  I am happy to see her as well if her schedule does not align with his schedule. I am hesitant to start calcitonin at this time given her history of parathyroidectomy and potential for hypocalcemia being aggravated by this. Otherwise, she will follow-up sooner as needed.   Mort Sawyers, M.D. [facility]

## 2017-06-25 NOTE — Patient Instructions (Addendum)
-  We will switch you to Tramadol 50 mg every 8 hours to use for pain.  -This will not have a constipating side effect like the hydrocodone has.  -If the tramadol isn't helping with pain, you can use the hydrocodone.  -DO NOT take the medication together. Only use one of them at a time.  -If the tramadol isn't working and you decide to use the hydrocodone only take 1 tablet every 8-12 hours.   -We do need to see you back in 2 weeks for follow-up and repeat x-ray of your spine. We have put the x-ray order in so go get the x-ray the day before or morning of your follow-up with [place]. -We will make this appt with Dr. Oneida Alar.   -If you start to get any numbness, tingling, weakness in your legs, or any fevers, chills, night sweats or any other symptoms go to the ED immediately.

## 2017-06-29 ENCOUNTER — Encounter (HOSPITAL_COMMUNITY): Payer: Self-pay | Admitting: Emergency Medicine

## 2017-06-29 ENCOUNTER — Emergency Department (HOSPITAL_COMMUNITY)
Admission: EM | Admit: 2017-06-29 | Discharge: 2017-06-29 | Disposition: A | Discharge status: Home / Self Care | Payer: Medicare Other | Attending: Emergency Medicine | Admitting: Emergency Medicine

## 2017-06-29 ENCOUNTER — Emergency Department (HOSPITAL_COMMUNITY): Payer: Medicare Other

## 2017-06-29 DIAGNOSIS — Y999 Unspecified external cause status: Secondary | ICD-10-CM | POA: Diagnosis not present

## 2017-06-29 DIAGNOSIS — W19XXXA Unspecified fall, initial encounter: Secondary | ICD-10-CM | POA: Insufficient documentation

## 2017-06-29 DIAGNOSIS — Z79899 Other long term (current) drug therapy: Secondary | ICD-10-CM | POA: Insufficient documentation

## 2017-06-29 DIAGNOSIS — S3992XA Unspecified injury of lower back, initial encounter: Secondary | ICD-10-CM | POA: Diagnosis present

## 2017-06-29 DIAGNOSIS — Y929 Unspecified place or not applicable: Secondary | ICD-10-CM | POA: Diagnosis not present

## 2017-06-29 DIAGNOSIS — Z87891 Personal history of nicotine dependence: Secondary | ICD-10-CM | POA: Insufficient documentation

## 2017-06-29 DIAGNOSIS — Y939 Activity, unspecified: Secondary | ICD-10-CM | POA: Insufficient documentation

## 2017-06-29 DIAGNOSIS — Z85828 Personal history of other malignant neoplasm of skin: Secondary | ICD-10-CM | POA: Diagnosis not present

## 2017-06-29 DIAGNOSIS — S32010A Wedge compression fracture of first lumbar vertebra, initial encounter for closed fracture: Secondary | ICD-10-CM | POA: Insufficient documentation

## 2017-06-29 MED ORDER — HYDROMORPHONE HCL 1 MG/ML IJ SOLN
0.5000 mg | Freq: Once | INTRAMUSCULAR | Status: AC
  Administered 2017-06-29: 0.5 mg via INTRAVENOUS
  Filled 2017-06-29: qty 1

## 2017-06-29 MED ORDER — ONDANSETRON HCL 4 MG/2ML IJ SOLN
4.0000 mg | Freq: Once | INTRAMUSCULAR | Status: AC
  Administered 2017-06-29: 4 mg via INTRAVENOUS
  Filled 2017-06-29: qty 2

## 2017-06-29 MED ORDER — HYDROCODONE-ACETAMINOPHEN 5-325 MG PO TABS: ORAL_TABLET | ORAL | 0 refills | Status: DC

## 2017-06-29 NOTE — ED Provider Notes (Signed)
[facility] DEPT Provider Note   CSN: [id number] Arrival date & time: 06/29/17  1319     History   Chief Complaint Chief Complaint  Patient presents with  . Back Pain    HPI Hailey Fox is a 77 y.o. female.  Patient complains of worsening low back pain.  She had a fall about a week ago and had a compression fracture of L1.  Patient has no bowel or bladder problems no pain running down her legs no weakness in her legs  The history is provided by the patient.  Back Pain   This is a recurrent problem. The current episode started more than 2 days ago. The problem occurs constantly. The problem has not changed since onset.The pain is associated with falling. The pain is present in the lumbar spine. The pain does not radiate. The pain is at a severity of 7/10. The pain is severe. The symptoms are aggravated by bending. Pertinent negatives include no chest pain, no headaches and no abdominal pain.    Past Medical History:  Diagnosis Date  . Allergy   . Arthritis   . Cancer ([place])    3 skin cancers removed. 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 Cataract    bil  . Complication of anesthesia    slow to wake up  . Dysrhythmia    PAC's   . GERD (gastroesophageal reflux disease)   . Osteoporosis   . PONV (postoperative nausea and vomiting)     Patient Active Problem List   Diagnosis Date Noted  . Radiculopathy, lumbar region 03/18/2017  . Degenerative tear of meniscus, left 02/13/2017  . Piriformis syndrome of right side 02/04/2017  . Degenerative tear of medial meniscus of right knee 07/26/2016  . Left knee pain 07/10/2016  . Chronic heel pain, left 03/28/2016  . 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 Multiple allergies 04/11/2015  . GERD (gastroesophageal reflux disease) 04/11/2015  . Left inguinal hernia 05/26/2012    Past Surgical History:  Procedure Laterality Date  . APPENDECTOMY  early 58's  . BREAST SURGERY Right 2003   palpiloma  .  HERNIA REPAIR    . INGUINAL HERNIA REPAIR Left 08/18/2012   Procedure: LEFT INGUINAL HERNIA REPAIR;  Surgeon: Haywood Lasso, MD;  Location: [facility];  Service: General;  Laterality: Left;  . PARATHYROIDECTOMY  [id number]  . TONSILLECTOMY  1945  . TUBAL LIGATION  early 80's     OB History   None      Home Medications    Prior to Admission medications   Medication Sig Start Date End Date Taking? Authorizing Provider  acetaminophen (TYLENOL) 500 MG tablet Take 500 mg by mouth 2 (two) times daily.   Yes [provider]  atorvastatin (LIPITOR) 10 MG tablet Take 10 mg by mouth at bedtime.    Yes [provider]  azelastine (ASTELIN) 137 MCG/SPRAY nasal spray Place 1 spray into the nose 2 (two) times daily.  05/04/12  Yes [provider]  Cholecalciferol (VITAMIN D-3 PO) Take 2,000 mg by mouth daily.   Yes [provider]  Coenzyme Q10 (COQ10 PO) Take 10 mLs by mouth daily after breakfast.   Yes [provider]  docusate sodium (COLACE) 100 MG capsule Take 1 capsule (100 mg total) by mouth every 12 (twelve) hours. Patient taking differently: Take 100 mg by mouth 2 (two) times daily as needed for moderate constipation.  06/22/17  Yes Ward, Delice Bison, DO  ibuprofen (  ADVIL,MOTRIN) 600 MG tablet Take 600 mg by mouth 2 (two) times daily.  06/27/17  Yes [provider]  KRILL OIL PO Take 1 tablet by mouth daily.   Yes [provider]  ondansetron (ZOFRAN ODT) 4 MG disintegrating tablet Take 1 tablet (4 mg total) by mouth every 8 (eight) hours as needed for nausea or vomiting. 06/22/17  Yes Ward, Cyril Mourning N, DO  pyridOXINE (VITAMIN B-6) 100 MG tablet Take 100 mg by mouth daily.   Yes [provider]  vitamin C (ASCORBIC ACID) 500 MG tablet Take 500 mg by mouth daily.   Yes [provider]  chlorpheniramine (CHLOR-TRIMETON) 4 MG tablet Take 4 mg by mouth 2 (two) times daily as needed for allergies.    [provider]    HYDROcodone-acetaminophen (NORCO/VICODIN) 5-325 MG tablet Take one every 4-6 hours for severe pain 06/29/17   Milton Ferguson, MD  naproxen sodium (ALEVE) 220 MG tablet Take 220 mg by mouth 2 (two) times daily as needed (pain).     [provider]  traMADol (ULTRAM) 50 MG tablet Take 1 tablet (50 mg total) by mouth every 6 (six) hours as needed. Patient not taking: Reported on 06/29/2017 06/25/17   [facility], Abbott Pao, MD    Family History Family History  Problem Relation Age of Onset  . Cancer Paternal Aunt        breast    Social History Social History   Tobacco Use  . Smoking status: Former Smoker    Years: 15.00  . Smokeless tobacco: Never Used  . Tobacco comment: quit smoking 1970's  Substance Use Topics  . Alcohol use: Yes    Alcohol/week: 1.8 oz    Types: 3 Glasses of wine per week    Comment: rare  . Drug use: No     Allergies   Codeine; Contrast media [iodinated diagnostic agents]; Macrodantin [nitrofurantoin macrocrystal]; Ciprofloxacin; Iohexol; and Tramadol   Review of Systems Review of Systems  Constitutional: Negative for appetite change and fatigue.  HENT: Negative for congestion, ear discharge and sinus pressure.   Eyes: Negative for discharge.  Respiratory: Negative for cough.   Cardiovascular: Negative for chest pain.  Gastrointestinal: Negative for abdominal pain and diarrhea.  Genitourinary: Negative for frequency and hematuria.  Musculoskeletal: Positive for back pain.  Skin: Negative for rash.  Neurological: Negative for seizures and headaches.  Psychiatric/Behavioral: Negative for hallucinations.     Physical Exam Updated Vital Signs BP 132/64 (BP Location: Left Arm)   Pulse 78   Temp 98.3 F (36.8 C) (Oral)   Resp 18   SpO2 97%   Physical Exam  Constitutional: She is oriented to person, place, and time. She appears well-developed.  HENT:  Head: Normocephalic.  Eyes: Conjunctivae and EOM are normal. No scleral icterus.   Neck: Neck supple. No thyromegaly present.  Cardiovascular: Normal rate and regular rhythm. Exam reveals no gallop and no friction rub.  No murmur heard. Pulmonary/Chest: No stridor. She has no wheezes. She has no rales. She exhibits no tenderness.  Abdominal: She exhibits no distension. There is no tenderness. There is no rebound.  Genitourinary:  Genitourinary Comments: Rectal exam tone normal  Musculoskeletal: Normal range of motion. She exhibits no edema.  Tender lumbar spine  Lymphadenopathy:    She has no cervical adenopathy.  Neurological: She is oriented to person, place, and time. She exhibits normal muscle tone. Coordination normal.  Skin: No rash noted. No erythema.  Psychiatric: She has a normal mood and  affect. Her behavior is normal.     ED Treatments / Results  Labs (all labs ordered are listed, but only abnormal results are displayed) Labs Reviewed - No data to display  EKG None  Radiology Ct Abdomen Pelvis Wo Contrast  Result Date: 06/29/2017 CLINICAL DATA:  Severe low back pain for several days. Known recent L1 superior endplate compression. Constipation. EXAM: CT ABDOMEN AND PELVIS WITHOUT CONTRAST TECHNIQUE: Multidetector CT imaging of the abdomen and pelvis was performed following the standard protocol without IV contrast. COMPARISON:  05/30/2009 CT and lumbar spine from 06/21/2017 FINDINGS: Lower chest: Normal heart size without pericardial effusion. Minimal coronary arteriosclerosis. Dependent and subsegmental atelectasis at the right lung base. No effusion or pneumothorax. Hepatobiliary: Well-circumscribed hypodensity in the left hepatic dome compatible with a cyst measuring 15 mm is redemonstrated. This appears slightly larger than on prior. A smaller hypodensity in the right hepatic lobe, too small to further characterize is identified as well as a smaller cystic hypodensity along the falciform since prior. No biliary dilatation. No gallstones. Pancreas: No  acute inflammation or mass.  No ductal dilatation. Spleen: Normal size spleen. Adrenals/Urinary Tract: Normal bilateral adrenal glands. Dominant simple cyst in the lower pole of the left kidney measuring 5.7 x 7.4 x 4.3 cm, slightly larger than on prior. Adjacent 18 mm simple exophytic interpolar cyst is also noted on the left. A punctate calcification is seen in the lower pole of the left kidney adjacent to the dominant cyst. 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 Complex 7 mm slightly exophytic cyst is noted arising from the right kidney, smaller in appearance than on the previous study and likely to represent a small complex proteinaceous or hemorrhagic cyst. No worrisome features are identified given limitations of a noncontrast study. No hydroureteronephrosis. The urinary bladder is physiologically distended. No focal mural thickening or calculus noted. Stomach/Bowel: Stomach is within normal limits. Small hiatal hernia noted. Appendix appears normal. No evidence of bowel wall thickening, distention, or inflammatory changes. Vascular/Lymphatic: Aortoiliac and branch vessel atherosclerosis. No aneurysm or adenopathy. 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 Other: No free air nor free fluid. Small fat containing umbilical hernia. Musculoskeletal: Acute biconcave osteoporotic compression fracture of L1 is redemonstrated but appears worse, now with at least 50% height loss centrally and with 7 mm of retropulsion off the superior posterior aspect. 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 IMPRESSION: 1. Worsening of biconcave osteoporotic compression fracture of L1, now with 50% height loss centrally and with 7 mm of retropulsion noted impressing upon the thecal sac and causing mild-to-moderate central canal stenosis, sagittal series 5/65. 2. 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 3. Simple and complex renal cysts as above described as well as hepatic hypodensities  also likely to represent small cysts. 4. Fibroid uterus. 5. No acute bowel obstruction or inflammation. Electronically Signed   By: Ashley Royalty M.D.   On: 06/29/2017 19:50    Procedures Procedures (including critical care time)  Medications Ordered in ED Medications  HYDROmorphone (DILAUDID) injection 0.5 mg (0.5 mg Intravenous Given 06/29/17 2028)  ondansetron (ZOFRAN) injection 4 mg (4 mg Intravenous Given 06/29/17 2026)     Initial Impression / Assessment and Plan / ED Course  I have reviewed the triage vital signs and the nursing notes.  Pertinent labs & imaging results that were available during my care of the patient were reviewed by me and considered in my medical decision making (see chart for details).     CT scan shows  worsening compression fracture with retropulsion.  I spoke with neurosurgery on-call and they stated patient can follow-up in their office this week.  Patient given Vicodin for pain  Final Clinical Impressions(s) / ED Diagnoses   Final diagnoses:  Closed compression fracture of first lumbar vertebra, initial encounter [facility])    ED Discharge Orders        Ordered    HYDROcodone-acetaminophen (NORCO/VICODIN) 5-325 MG tablet     06/29/17 2116       Milton Ferguson, MD 06/29/17 2125

## 2017-06-29 NOTE — ED Triage Notes (Signed)
Pt reports she was here last weekend and has compression fracture and wearing back brace. Reports that she having severe lower back pains for past couple days esp when brace presses down on that area. Reports that she also constipated.

## 2017-06-29 NOTE — Discharge Instructions (Signed)
Follow-up at [facility] neurosurgery.  Called tomorrow to make an appointment this week and tell him that we spoke with their on-call provider who stated he need to be seen this week

## 2017-07-08 ENCOUNTER — Ambulatory Visit: Payer: Medicare Other | Admitting: Sports Medicine

## 2017-07-16 ENCOUNTER — Ambulatory Visit: Payer: Medicare Other | Admitting: Family Medicine

## 2017-07-16 ENCOUNTER — Encounter: Payer: Self-pay | Admitting: Family Medicine

## 2017-07-16 VITALS — BP 139/76 | Ht 63.0 in | Wt 138.0 lb

## 2017-07-16 DIAGNOSIS — M75101 Unspecified rotator cuff tear or rupture of right shoulder, not specified as traumatic: Secondary | ICD-10-CM

## 2017-07-16 DIAGNOSIS — M25511 Pain in right shoulder: Secondary | ICD-10-CM

## 2017-07-16 DIAGNOSIS — M12811 Other specific arthropathies, not elsewhere classified, right shoulder: Secondary | ICD-10-CM | POA: Insufficient documentation

## 2017-07-16 NOTE — Progress Notes (Signed)
HPI  CC: Right shoulder pain Patient is here regarding her right-sided shoulder pain over the past 2 weeks.  Patient has been dealing with significant musculoskeletal issues over the past month as she recently sustained a hard fall which caused a compression fracture of her L-spine.  Patient states that she did not have any shoulder pain at the time of the injury but approximately 2 weeks ago she had been handed a iPad and upon receiving the weights of this object there was an audible "pop" in which others within the room could hear.  She states that the pain was immediate.  It is located across the proximal humerus along the lateral aspect.  Pain was 10 out of 10 at the time.  She states that the pain occasionally radiated down the lateral arm all the way to the elbow.  She denies any fall, trauma, or injury to the arm between the initial fall 3 to 4 weeks ago and today.  She denies any ecchymosis or swelling.  Range of motion seems to be painful and slightly limited but she is unsure whether or not this is secondary to pain or true limitation.  She denies any numbness, or paresthesias.  Medications/Interventions Tried: Relative rest  See HPI and/or previous note for associated ROS.  Objective: BP 139/76   Ht 5\' 3"  (1.6 m)   Wt 138 lb (62.6 kg)   BMI 24.45 kg/m  Gen: NAD, well groomed, a/o x3, normal affect.  CV: Well-perfused. Warm.  Resp: Non-labored.  Neuro: Sensation intact throughout. No gross coordination deficits.  Gait: Nonpathologic posture, unremarkable stride without signs of limp or balance issues. Shoulder, Right: TTP noted at the anterior and lateral aspect of the shoulder/proximal humerus. No evidence of bony deformity, asymmetry, or muscle atrophy; No tenderness over long head of biceps (bicipital groove). No TTP at [facility] joint. Active and passive range of motion limited by pain with overhead movements, but ER/IR relatively intact. Strength 5/5 throughout. Sensation intact.  Peripheral pulses intact.  Special Tests:   - Crossarm test: NEG   - Empty can: Positive   - Hawkins: Unable to tolerate   - Obrien's test: NEG   - Yergason's: NEG   - Speeds test: NEG  ULTRASOUND: Shoulder, Right  Diagnostic limited ultrasound imaging obtained of patient's right shoulder.  -Abnormal step-off noted upon visualization of the posterior aspect of the humeral head.  Concern for proximal humerus fracture due to prior mechanism of injury.  There is obvious degenerative changes noted throughout the rest of the exam consistent with osteophyte development and arthritic changes.    - Long head of the biceps tendon:  evidence of tendon thickening, and edema with some splitting noted on short axis.   - Subscapularis tendon: complete visualization across the width of the insertion point yielded mild to moderate calcific changes with no obvious tears.   - Supraspinatus tendon: complete visualization across the width of the insertion point yielded evidence suggesting possible full-thickness tear with partial retraction, unfortunately anatomy seem to be relatively abnormal due to the suspected proximal humerus fracture.    - Infraspinatus and teres minor tendons: Extremely difficult to assess due to the bony anatomy abnormalities appreciated posteriorly.   IMPRESSION: findings consistent with suspected proximal humerus fracture with possible full-thickness supraspinatus tear.   Assessment and plan:  Acute pain of right shoulder Patient is here with complaints of right-sided acute traumatic shoulder pain which may actually be secondary to a proximal humerus fracture.  Initial concern was  for supraspinatus rotator cuff tear however ultrasound revealed bony deformity that may represent a proximal humerus fracture.  No red flag symptoms or evidence of neurovascular compromise. -X-ray ordered today -I will contact patient tomorrow once results have been obtained. -If no fracture is present then  patient has been given rotator cuff strengthening exercises and Thera-Band to begin gentle range of motion and strengthening exercises in approximately 1 week. -If fracture is present patient is aware that she is to hold off on these exercises and we will discuss treatment options at that time.   Orders Placed This Encounter  Procedures  . DG Shoulder Right    Standing Status:   Future    Standing Expiration Date:   09/16/2018    Order Specific Question:   Reason for Exam (SYMPTOM  OR DIAGNOSIS REQUIRED)    Answer:   right shoulder pain; rule out humeral head fx    Order Specific Question:   Preferred imaging location?    Answer:   [facility]    Order Specific Question:   Radiology Contrast Protocol - do NOT remove file path    Answer:   [email or web address]    Elberta Leatherwood, MD,MS [facility] Fellow 07/16/2017 5:50 PM

## 2017-07-16 NOTE — Assessment & Plan Note (Signed)
Patient is here with complaints of right-sided acute traumatic shoulder pain which may actually be secondary to a proximal humerus fracture.  Initial concern was for supraspinatus rotator cuff tear however ultrasound revealed bony deformity that may represent a proximal humerus fracture.  No red flag symptoms or evidence of neurovascular compromise. -X-ray ordered today -I will contact patient tomorrow once results have been obtained. -If no fracture is present then patient has been given rotator cuff strengthening exercises and Thera-Band to begin gentle range of motion and strengthening exercises in approximately 1 week. -If fracture is present patient is aware that she is to hold off on these exercises and we will discuss treatment options at that time.

## 2017-07-17 ENCOUNTER — Ambulatory Visit
Admission: RE | Admit: 2017-07-17 | Discharge: 2017-07-17 | Disposition: A | Discharge status: Home / Self Care | Payer: Medicare Other | Source: Ambulatory Visit | Attending: Family Medicine | Admitting: Family Medicine

## 2017-07-17 DIAGNOSIS — M25511 Pain in right shoulder: Secondary | ICD-10-CM

## 2017-09-02 ENCOUNTER — Ambulatory Visit: Payer: Medicare Other | Admitting: Sports Medicine

## 2017-09-02 ENCOUNTER — Encounter: Payer: Self-pay | Admitting: Sports Medicine

## 2017-09-02 VITALS — BP 143/78 | Ht 63.0 in | Wt 138.0 lb

## 2017-09-02 DIAGNOSIS — S32000A Wedge compression fracture of unspecified lumbar vertebra, initial encounter for closed fracture: Secondary | ICD-10-CM

## 2017-09-02 DIAGNOSIS — M75101 Unspecified rotator cuff tear or rupture of right shoulder, not specified as traumatic: Secondary | ICD-10-CM | POA: Diagnosis not present

## 2017-09-02 DIAGNOSIS — N281 Cyst of kidney, acquired: Secondary | ICD-10-CM | POA: Diagnosis not present

## 2017-09-02 DIAGNOSIS — S46011A Strain of muscle(s) and tendon(s) of the rotator cuff of right shoulder, initial encounter: Secondary | ICD-10-CM

## 2017-09-02 DIAGNOSIS — M12811 Other specific arthropathies, not elsewhere classified, right shoulder: Secondary | ICD-10-CM

## 2017-09-02 MED ORDER — IBUPROFEN 600 MG PO TABS: 600.0000 mg | ORAL_TABLET | Freq: Three times a day (TID) | ORAL | 1 refills | Status: DC

## 2017-09-02 NOTE — Assessment & Plan Note (Signed)
Cont to use NSAIDs for pain relief  ROM  Time

## 2017-09-02 NOTE — Progress Notes (Signed)
Hailey Fox is a 77 y.o. female who is here for follow up of right shoulder pain.     HPI: Since her last visit, Hailey Fox says the right shoulder pain has not improved.  Has intermittent diffuse right shoulder pain throughout the day usually 2/10, but increasing to 1/61 with certain movements.  Pain is worse with trying to push something in front of her, lift light weight over 90degrees, and with twisting motions.  Tries to avoid laying directly on right shoulder. Denies any numbness or tingling in her right arm.  She is left-hand dominant but does use her right hand quite frequently.  Has been doing the exercises as recommended most days of the week, does not think these are helping with the pain.  Was last seen in sports medicine for shoulder pain after a fall on 07/16/2017.  At that time she was tender in the anterior and lateral aspect of the shoulder and proximal humerus and had pain with overhead movements.  Positive empty can.  Ultrasound showed findings consistent with suspected proximal humerus fracture with possible full-thickness supraspinatus tear.  Follow-up x-ray showed no fracture.  Recommended to do rotator cuff strengthening and Thera-Band exercises.  Also since her last visit, she was seen by neurosurgery, Dr. Katherine Fox with [facility], and recommended for a back brace for her compression fracture of the first lumbar vertebra.  She will return to follow-up with neurosurgery on 15 July and hopes to come out of the back brace then.  Has not had a recent evaluation for osteoporosis.  Reports that CT at her ED visit on 06/29/2017 also showed abnormal cyst on her kidneys, but has not had further evaluation for this either. Denies any new bowel or bladder symptoms. Has had weight loss since her fall and being in back brace.  Review of systems:  As stated above   Interval past medical history, surgical history, family history, and social history obtained and reviewed.   Patient  Active Problem List   Diagnosis Date Noted  . Acute pain of right shoulder 07/16/2017  . Radiculopathy, lumbar region 03/18/2017  . Degenerative tear of meniscus, left 02/13/2017  . Piriformis syndrome of right side 02/04/2017  . Degenerative tear of medial meniscus of right knee 07/26/2016  . Left knee pain 07/10/2016  . Chronic heel pain, left 03/28/2016  . 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 Multiple allergies 04/11/2015  . GERD (gastroesophageal reflux disease) 04/11/2015  . Left inguinal hernia 05/26/2012    Physical Exam:  BP (!) 143/78   Ht 5\' 3"  (1.6 m)   Wt 138 lb (62.6 kg)   BMI 24.45 kg/m    Vital signs are reviewed and are documented in the chart Gen: Alert, oriented, appears stated age, in no apparent distress, pleasant. Wearing a back brace. HEENT: Moist oral mucosa Respiratory: Normal respirations, able to speak in full sentences Cardiac: Regular rate, distal pulses 2+ Integumentary: No rashes on visible skin.  Neurologic: Rotator cuff strength on the right side today is intact, weakness with special tests as below due to pain,sensation intact in bilateral upper extremities.  Normal grip strength. Psych: Normal affect, mood is described as good Musculoskeletal: Inspection of right shoulder reveals no obvious deformity or muscle atrophy, no warmth, erythema, ecchymosis, or effusion. No edema. No tenderness on the right anterolateral arm over the bicipital groove and over the distal supraspinatus rotator cuff insertion, no tenderness over scapular spine, distal clavicle, or AC joint. Has full right shoulder range  of motion today - though pain with internal rotation.  Special Tests:                         - Crossarm test: neg                         - Empty can: Positive                         - Hawkins: Positive                         - Obrien's test: neg                         - Yergason's: neg                         - Speeds test: neg  CT from 06/29/17  reviewed with patient  Renal cysts noted and small hepatic cyst.  Ultrasound: RT shoulder Biceps tendon intact with minor splitting at proximal fibers Suprapinatus is absent from endplate replaced by hypoechoic change along the anterior fibers of the tendon Interval view shows absence of suprapinatus at the insertion Infrapinatus and Teres minor normal Subscapularis normal AC  Joint with narrowing and small effusion  Impression: Retracted supraspinatus tear likely full thickness but only partial width of tendon  Ultrasound and interpretation by Hailey Fox. Hailey Hitsman, MD   Assessment/Plan: Hailey Fox is a 77 year old female here for follow-up of her right shoulder pain. Pain is unimproved from the last visit.  Physical exam and ultrasound today are consistent with a full-thickness high-grade partial tear of the rotator cuff involving the supraspinatus and biceps tendons. She continues to have good range of motion and pain is well controlled with OTC NSAIDs.  1) Rotator cuff tear -Recommend strengthening and ROM exercises daily.  Reviewed exercises with patient. 10reps, 2-3x/day, as tolerated. -Ok to continue ibuprofen 600 mg up to 4 times daily, can alternate with Tylenol for additional pain relief. -Avoid heavy overhead lifting.  2) L1 compression fracture -Continues to follow with neurosurgery and is currently in a back brace -No recent osteoporosis evaluation, would recommend that she follow-up with orthopedics.  Appointment made for her today with Hailey Orleans, MD  3) Renal cysts -Follow-up with PCP to consider nephrology referral  Follow up: In 4-6 weeks   Hailey Distance, MD, MS [facility] PGY3  I observed and examined the patient with the resident and agree with assessment and plan.  Note reviewed and modified by me. Hailey Libel, MD

## 2017-09-02 NOTE — Assessment & Plan Note (Signed)
Cont bracing and followup with NS

## 2017-09-02 NOTE — Patient Instructions (Addendum)
Thanks for visiting Dr. Oneida Alar today.  You have a rotator cuff tear of the right shoulder.  We recommend the following:  -You can continue ibuprofen 600 mg up to 4 times a day as needed for pain, but can alternate with Tylenol for additional pain relief.  -Do the exercises for your shoulder that we reviewed (lifting your arm, rotating at your shoulder).  Do 10 repetitions, 2-3 times per day or as tolerated.  You do not need to use weights but it is okay to use the soup can.  -You can use muscle relaxant but only if you feel muscle spasms  -Please contact your PCP to discuss the renal cysts seen on your CT scan and whether you should follow-up with a nephrologist.  -Follow-up with orthopedics to discuss an osteoporosis evaluation.  We have arranged this appointment for you.  Dr Jacqlyn Larsen [facility] [address] [place] [address]  Wednesday 09/03/17 @ 815am. Arrival time is at [place] Phone: [phone]

## 2017-10-27 ENCOUNTER — Other Ambulatory Visit: Payer: Self-pay | Admitting: Radiology

## 2017-11-19 ENCOUNTER — Other Ambulatory Visit: Payer: Self-pay | Admitting: Radiology

## 2018-01-01 ENCOUNTER — Ambulatory Visit: Payer: Self-pay | Admitting: Surgery

## 2018-01-05 ENCOUNTER — Ambulatory Visit: Payer: Self-pay | Admitting: Surgery

## 2018-01-05 NOTE — H&P (Signed)
Zanovia Rotz Dubreuil Documented: 01/01/2018 11:18 AM Location: [facility] Patient #: [id number] DOB: April 25, 1940 Married / Language: Cleophus Molt / Race: White Female  History of Present Illness Adin Hector MD; 01/05/2018 1:54 PM) The patient is a 77 year old female who presents with an inguinal hernia. Note for "Inguinal hernia": ` ` ` Patient sent for surgical consultation at the request of Roe Coombs, [place]  Chief Complaint: Groin bulging, possible hernia ` ` The patient is a pleasant active woman. History of left inguinal hernia repair by Dr. Margot Chimes. Had some pain and discomfort there. She is noted some bulging on the right side. Intermittent. Did not seem classic for the hernia but concerning. Examination suspicious for hernia. Surgical consultation requested. Changes suggesting her osteoporosis medication. Uterine prolapse. She's had some urinary tract infections. Had recurrent episodes for well but under control now. Otherwise active. Moves her bowels most days. No history of skin infections or MRSA.  (Review of systems as stated in this history (HPI) or in the review of systems. Otherwise all other 12 point ROS are negative) ` ` `   Allergies Emeline Gins, CMA; 01/01/2018 11:34 AM) Cipro *FLUOROQUINOLONES* Macrodantin *Urinary Anti-infectives** Codeine Sulfate *ANALGESICS - OPIOID* Iodinated Diagnostic Agents Hives. Allergies Reconciled  Medication History Emeline Gins, CMA; 01/01/2018 11:34 AM) Atorvastatin Calcium (10MG  Tablet, Oral) Active. Krill Oil (300MG  Capsule, Oral) Active. D-Mannose (350MG  Capsule, Oral) Active. Vitamin B6 (50MG  Tablet, Oral) Active. Calcium-Phosphorus-Vitamin D (140-25-100MG -MG-UNIT Wafer, Oral) Active. CoQ10 (100MG  Capsule, Oral) Active. Tylenol Extra Strength (500MG  Tablet, Oral as needed) Active. Azelastine HCl (0.1% Solution, Nasal as needed) Active. Medications Reconciled  Social History  Emeline Gins, [place]; 01/01/2018 11:34 AM) Alcohol use Recently quit alcohol use. No drug use  Family History Emeline Gins, [place]; 01/01/2018 11:34 AM) Migraine Headache Daughter.  Pregnancy / Birth History Emeline Gins, [place]; 01/01/2018 11:34 AM) Durenda Age 2  Other Problems Emeline Gins, [place]; 01/01/2018 11:34 AM) Back Pain Heart murmur     Review of Systems Emeline Gins CMA; 01/01/2018 11:34 AM) Breast Not Present- Breast Mass, Breast Pain, Nipple Discharge and Skin Changes.  Vitals Emeline Gins CMA; 01/01/2018 11:24 AM) 01/01/2018 11:24 AM Weight: 129.2 lb Height: 63in Body Surface Area: 1.61 m Body Mass Index: 22.89 kg/m  Temp.: 98.30F  Pulse: 94 (Regular)  BP: 146/80 (Sitting, Left Arm, Standard)      Physical Exam Adin Hector MD; 01/01/2018 11:46 AM)  General Mental Status-Alert. General Appearance-Not in acute distress, Not Sickly. Orientation-Oriented X3. Hydration-Well hydrated. Voice-Normal.  Integumentary Global Assessment Upon inspection and palpation of skin surfaces of the - Axillae: non-tender, no inflammation or ulceration, no drainage. and Distribution of scalp and body hair is normal. General Characteristics Temperature - normal warmth is noted.  Head and Neck Head-normocephalic, atraumatic with no lesions or palpable masses. Face Global Assessment - atraumatic, no absence of expression. Neck Global Assessment - no abnormal movements, no bruit auscultated on the right, no bruit auscultated on the left, no decreased range of motion, non-tender. Trachea-midline. Thyroid Gland Characteristics - non-tender.  Eye Eyeball - Left-Extraocular movements intact, No Nystagmus. Eyeball - Right-Extraocular movements intact, No Nystagmus. Cornea - Left-No Hazy. Cornea - Right-No Hazy. Sclera/Conjunctiva - Left-No scleral icterus, No Discharge. Sclera/Conjunctiva - Right-No scleral  icterus, No Discharge. Pupil - Left-Direct reaction to light normal. Pupil - Right-Direct reaction to light normal.  ENMT Ears Pinna - Left - no drainage observed, no generalized tenderness observed. Right - no drainage observed, no generalized tenderness observed. Nose and Sinuses External Inspection  of the Nose - no destructive lesion observed. Inspection of the nares - Left - quiet respiration. Right - quiet respiration. Mouth and Throat Lips - Upper Lip - no fissures observed, no pallor noted. Lower Lip - no fissures observed, no pallor noted. Nasopharynx - no discharge present. Oral Cavity/Oropharynx - Tongue - no dryness observed. Oral Mucosa - no cyanosis observed. Hypopharynx - no evidence of airway distress observed.  Chest and Lung Exam Inspection Movements - Normal and Symmetrical. Accessory muscles - No use of accessory muscles in breathing. Palpation Palpation of the chest reveals - Non-tender. Auscultation Breath sounds - Normal and Clear.  Cardiovascular Auscultation Rhythm - Regular. Murmurs & Other Heart Sounds - Auscultation of the heart reveals - No Murmurs and No Systolic Clicks.  Abdomen Inspection Inspection of the abdomen reveals - No Visible peristalsis and No Abnormal pulsations. Umbilicus - No Bleeding, No Urine drainage. Palpation/Percussion Palpation and Percussion of the abdomen reveal - Soft, Non Tender, No Rebound tenderness, No Rigidity (guarding) and No Cutaneous hyperesthesia. Note: Abdomen soft. Not severely distended. No distasis recti. No umbilical or other anterior abdominal wall hernias  Female Genitourinary Sexual Maturity Tanner 5 - Adult hair pattern. Note: Obvious right groin bulging laterally. Small but reducible. Consistent with inguinal hernia. No evidence of left inguinal hernia recurrence. No vaginal bleeding nor discharge  Peripheral Vascular Upper Extremity Inspection - Left - No Cyanotic nailbeds, Not Ischemic. Right -  No Cyanotic nailbeds, Not Ischemic.  Neurologic Neurologic evaluation reveals -normal attention span and ability to concentrate, able to name objects and repeat phrases. Appropriate fund of knowledge , normal sensation and normal coordination. Mental Status Affect - not angry, not paranoid. Cranial Nerves-Normal Bilaterally. Gait-Normal.  Neuropsychiatric Mental status exam performed with findings of-able to articulate well with normal speech/language, rate, volume and coherence, thought content normal with ability to perform basic computations and apply abstract reasoning and no evidence of hallucinations, delusions, obsessions or homicidal/suicidal ideation.  Musculoskeletal Global Assessment Spine, Ribs and Pelvis - no instability, subluxation or laxity. Right Upper Extremity - no instability, subluxation or laxity.  Lymphatic Head & Neck  General Head & Neck Lymphatics: Bilateral - Description - No Localized lymphadenopathy. Axillary  General Axillary Region: Bilateral - Description - No Localized lymphadenopathy. Femoral & Inguinal  Generalized Femoral & Inguinal Lymphatics: Left - Description - No Localized lymphadenopathy. Right - Description - No Localized lymphadenopathy.    Assessment & Plan Adin Hector MD; 01/05/2018 1:49 PM)  RIGHT INGUINAL HERNIA (K40.90) Impression: Right groin bulging suspicious for inguinal hernia. Suspected on CAT scan from the summer as well. History of left inguinal hernia repair 5 years ago with no evidence of recurrence.  While it is small, I think she could benefit from surgical repair at some point. She is quite active and odds are quite good she's got many more years for this to inevitably get larger and be an issue. She would like to avoid an emergency surgery situation or discomfort as the left inguinal hernia presented with prior to surgery. She wishes to be proactive. She is interested in proceeding with surgery after the  holidays.   PREOP - ING HERNIA - ENCOUNTER FOR PREOPERATIVE EXAMINATION FOR GENERAL SURGICAL PROCEDURE (Z01.818)  Current Plans You are being scheduled for surgery- Our schedulers will call you.  You should hear from our office's scheduling department within 5 working days about the location, date, and time of surgery. We try to make accommodations for patient's preferences in scheduling surgery, but sometimes  the OR schedule or the surgeon's schedule prevents [place] from making those accommodations.  If you have not heard from our office [phone]) in 5 working days, call the office and ask for your surgeon's nurse.  If you have other questions about your diagnosis, plan, or surgery, call the office and ask for your surgeon's nurse.  Written instructions provided The anatomy & physiology of the abdominal wall and pelvic floor was discussed. The pathophysiology of hernias in the inguinal and pelvic region was discussed. Natural history risks such as progressive enlargement, pain, incarceration, and strangulation was discussed. Contributors to complications such as smoking, obesity, diabetes, prior surgery, etc were discussed.  I feel the risks of no intervention will lead to serious problems that outweigh the operative risks; therefore, I recommended surgery to reduce and repair the hernia. I explained laparoscopic techniques with possible need for an open approach. I noted usual use of mesh to patch and/or buttress hernia repair  Risks such as bleeding, infection, abscess, need for further treatment, heart attack, death, and other risks were discussed. I noted a good likelihood this will help address the problem. Goals of post-operative recovery were discussed as well. Possibility that this will not correct all symptoms was explained. I stressed the importance of low-impact activity, aggressive pain control, avoiding constipation, & not pushing through pain to minimize risk of  post-operative chronic pain or injury. Possibility of reherniation was discussed. We will work to minimize complications.  An educational handout further explaining the pathology & treatment options was given as well. Questions were answered. The patient expresses understanding & wishes to proceed with surgery.  Pt Education - Pamphlet Given - Laparoscopic Hernia Repair: discussed with patient and provided information. Pt Education - CCS Hernia Post-Op HCI (Illiana Losurdo): discussed with patient and provided information. Pt Education - CCS Mesh education: discussed with patient and provided information.  Adin Hector, MD, FACS, MASCRS Gastrointestinal and Minimally Invasive Surgery    [address], [place], [place] [address] [phone] [address] / Paging [phone] Fax

## 2019-05-11 ENCOUNTER — Encounter: Payer: Self-pay | Admitting: Sports Medicine

## 2019-05-11 ENCOUNTER — Ambulatory Visit: Payer: Medicare PPO | Admitting: Sports Medicine

## 2019-05-11 ENCOUNTER — Other Ambulatory Visit: Payer: Self-pay

## 2019-05-11 VITALS — BP 134/80 | Ht 62.0 in | Wt 120.0 lb

## 2019-05-11 DIAGNOSIS — G8929 Other chronic pain: Secondary | ICD-10-CM

## 2019-05-11 DIAGNOSIS — M25562 Pain in left knee: Secondary | ICD-10-CM

## 2019-05-11 NOTE — Progress Notes (Signed)
   [facility] [address] [place], [place] [address] Phone: [phone] Fax: [phone]   Patient Name: Hailey Fox Date of Birth: 1940/04/22 Medical Record Number: [id number] Gender: female Date of Encounter: 05/11/2019  SUBJECTIVE:      Chief Complaint:  Chronic left knee pain   HPI:  Hailey Fox is following up for chronic left knee pain.  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  She is having difficulty walking downhill and down stairs.  She is not having much pain, but is worried about her function.  She denies any new swelling to the knee.  There is no numbness and tingling radiating down her leg.  She feels her balance is appropriate.  She does notice her hips seem uneven when she is walking.     ROS:     See HPI. No giving way; No locking   PERTINENT  PMH / PSH / FH / SH:  Past Medical, Surgical, Social, and Family History Reviewed & Updated in the EMR. Pertinent findings include:  Lumbar radiculopathy, history of L1 compression fracture, degenerative changes of the right knee   OBJECTIVE:  BP 134/80   Ht 5\' 2"  (1.575 m)   Wt 120 lb (54.4 kg)   BMI 21.95 kg/m  Physical Exam:  Vital signs are reviewed.   GEN: Alert and oriented, NAD Pulm: Breathing unlabored PSY: normal mood, congruent affect  MSK: Left knee: Normal to inspection with no erythema or effusion  Advanced medial compartment osteophytic changes Palpation normal with no warmth, joint line tenderness, patellar tenderness, or condyle tenderness. ROM full in extension and lower leg rotation. Active knee flexion to 140 degrees Ligaments with solid consistent endpoints including ACL, PCL, LCL, MCL. Positive Mcmurray's Patellar and quadriceps tendons unremarkable. Hamstring and quadriceps strength is normal.  Weak hip abduction Neurovascularly intact.  Ambulatory exam Right hip is shifting upwards while walking Mild scoliosis appreciated  during walking and on exam of lumbar spine  ASSESSMENT & PLAN:   1. Chronic left knee pain  We fitted patient with a heel lift in her left shoe today.  We provided patient with hip and quad exercises at home.  As long as she is not having pain that she cannot handle or loss of ability to function, she can continue to go without knee replacement at this time.  Can follow-up with [place] on an as-needed basis.   Lanier Clam, DO, ATC Sports Medicine Fellow  I observed and examined the patient with Dr. Kathrynn Speed and agree with assessment and plan.  Note reviewed and modified by me. Ila Mcgill, MD

## 2019-09-29 IMAGING — CT CT ABD-PELV W/O CM
2 of 4 series · 15 of 46 positions shown, 17 images · non-contrast
Comparison: 05/30/2009 CT and lumbar spine from 06/21/2017

CLINICAL DATA: Severe low back pain for several days. Known recent
L1 superior endplate compression. Constipation.

EXAM:
CT ABDOMEN AND PELVIS WITHOUT CONTRAST
TECHNIQUE: Multidetector CT imaging of the abdomen and pelvis was performed
following the standard protocol without IV contrast.

[Series 2: axial st · axial · 0.90mm/px · z∈[+1098,+1488]mm · 12 of 90 slices shown, 14 images]
[im 6/90  soft-tissue]
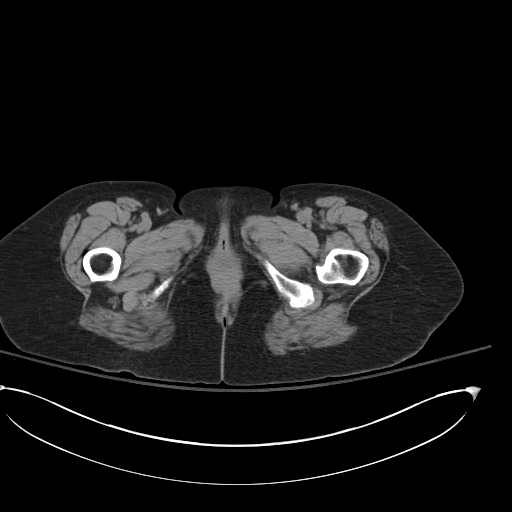
[im 6/90  bone]
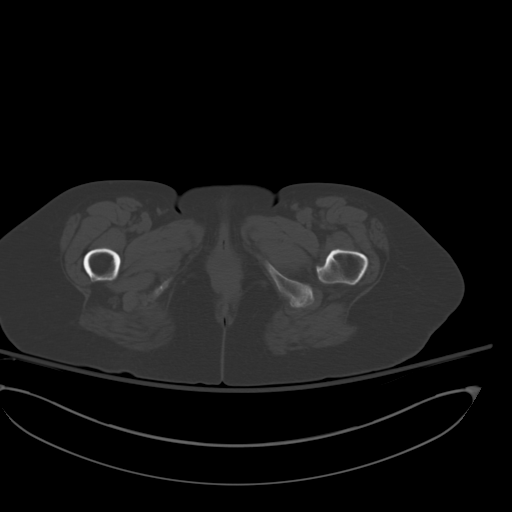
[im 16/90  soft-tissue]
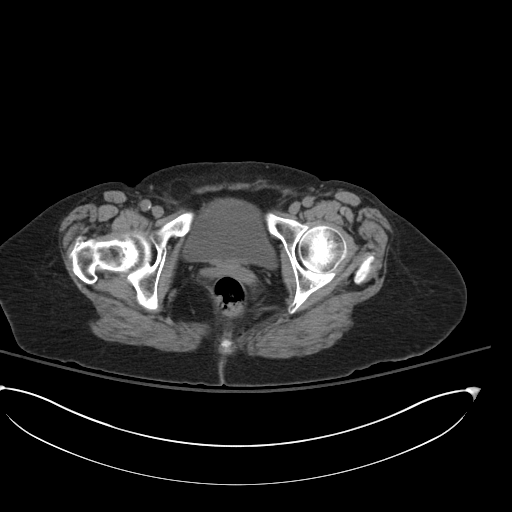
[im 21/90  soft-tissue]
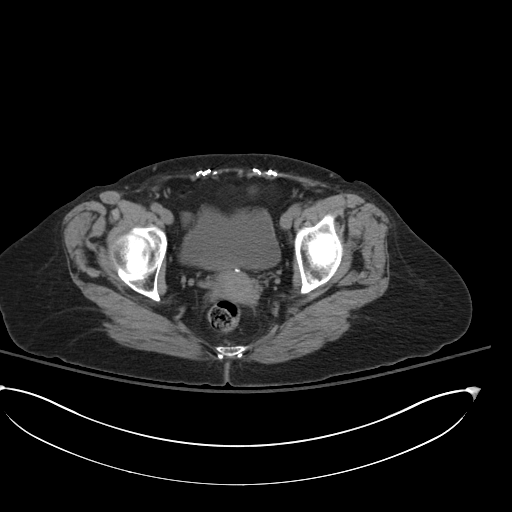
[im 27/90  soft-tissue]
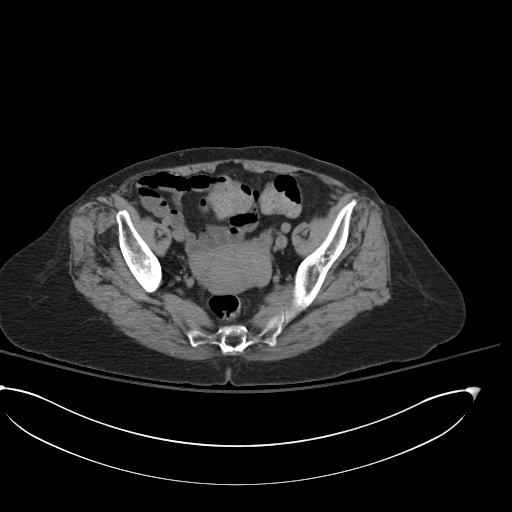
[im 37/90  soft-tissue]
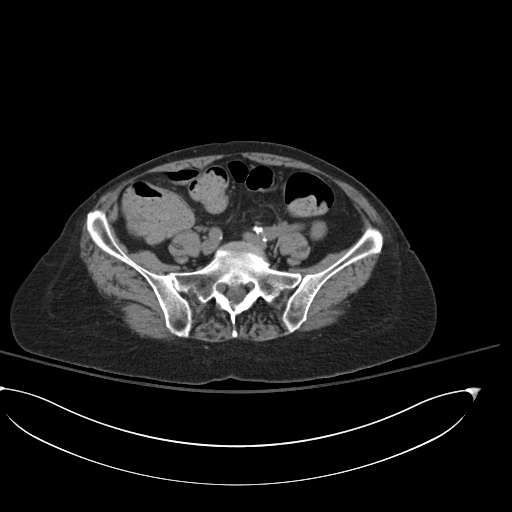
[im 42/90  soft-tissue]
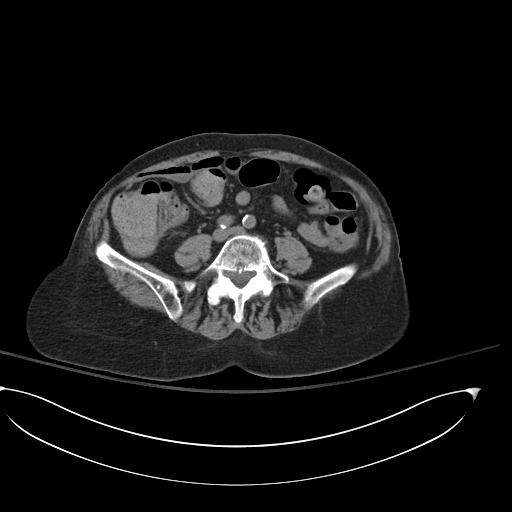
[im 48/90  soft-tissue]
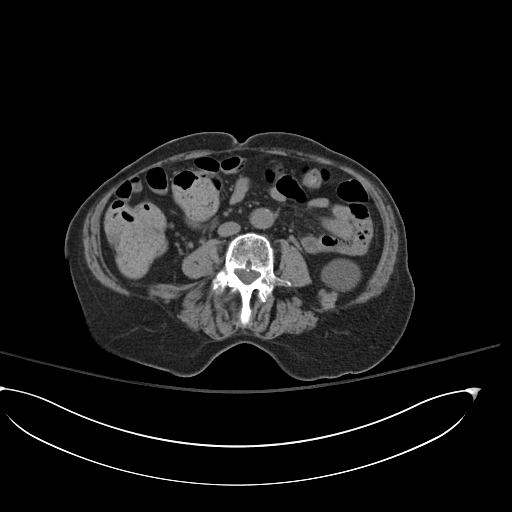
[im 58/90  soft-tissue]
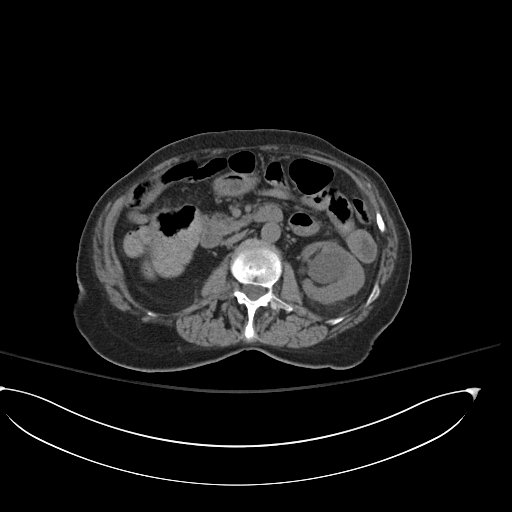
[im 63/90  soft-tissue]
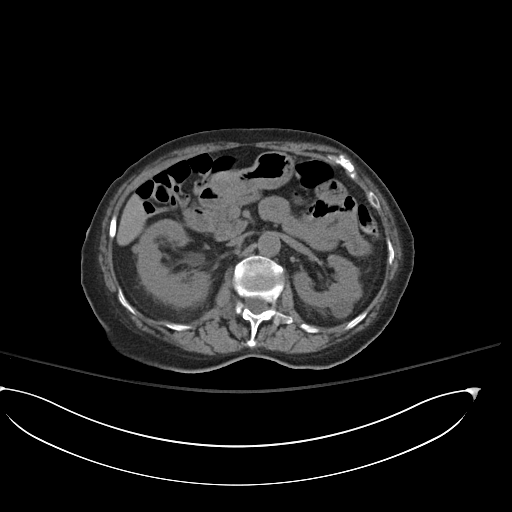
[im 63/90  bone]
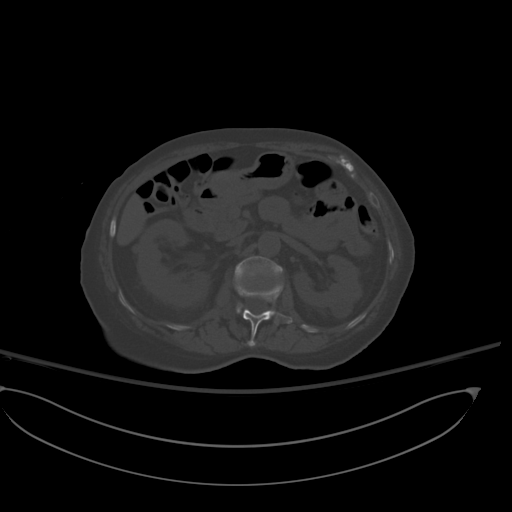
[im 69/90  soft-tissue]
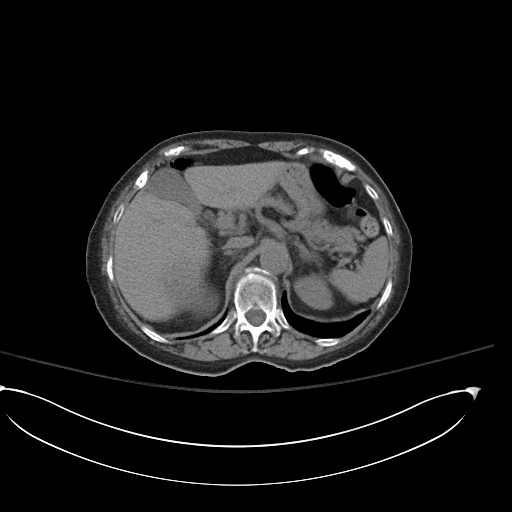
[im 79/90  soft-tissue]
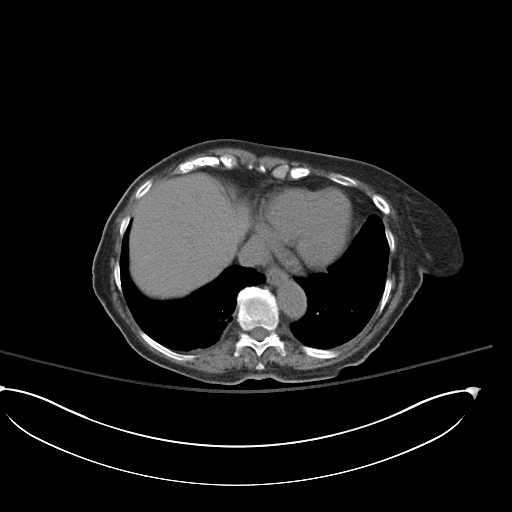
[im 84/90  soft-tissue]
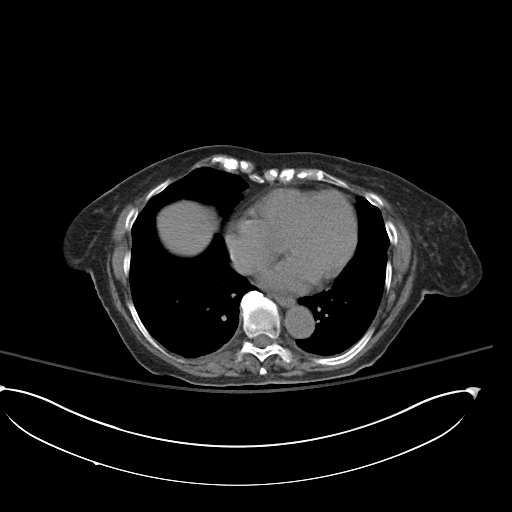

[Series 4: coronal st · coronal · 0.85mm/px · 3 of 101 slices shown]
[im 34/101  soft-tissue]
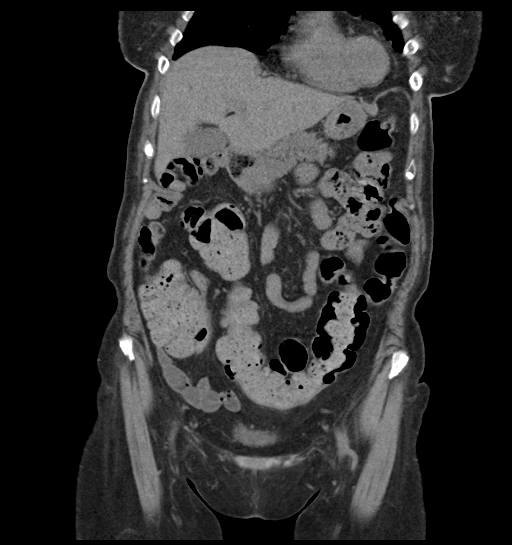
[im 45/101  soft-tissue]
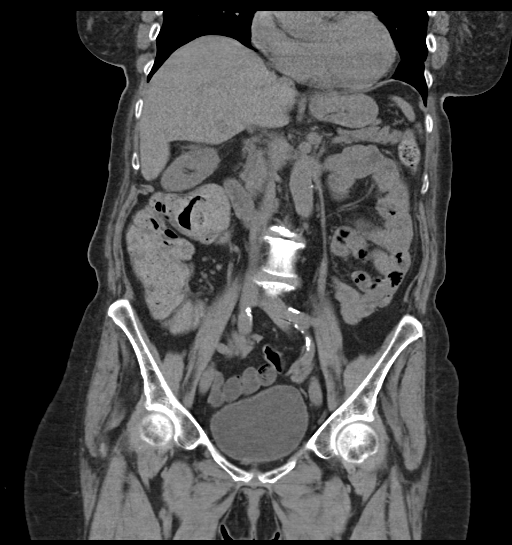
[im 56/101  soft-tissue]
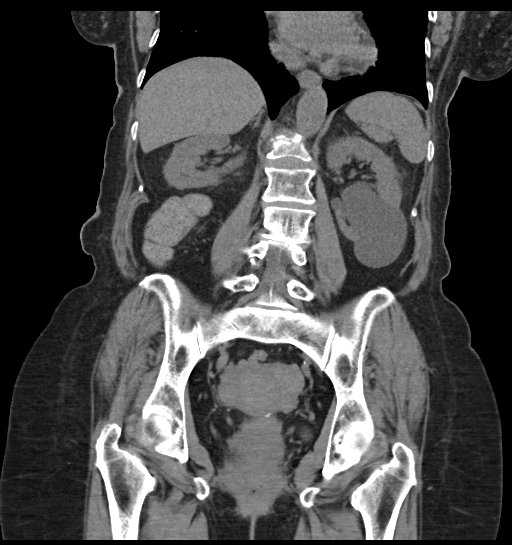

[15 of 46 positions shown; findings below may reference images not displayed]

FINDINGS: Lower chest: Normal heart size without pericardial effusion. Minimal
coronary arteriosclerosis. Dependent and subsegmental atelectasis at
the right lung base. No effusion or pneumothorax.

Hepatobiliary: Well-circumscribed hypodensity in the left hepatic
dome compatible with a cyst measuring 15 mm is redemonstrated. This
appears slightly larger than on prior. A smaller hypodensity in the
right hepatic lobe, too small to further characterize is identified
as well as a smaller cystic hypodensity along the falciform since
prior. No biliary dilatation. No gallstones.

Pancreas: No acute inflammation or mass.  No ductal dilatation.

Spleen: Normal size spleen.

Adrenals/Urinary Tract: Normal bilateral adrenal glands. Dominant
simple cyst in the lower pole of the left kidney measuring 5.7 x
x 4.3 cm, slightly larger than on prior. Adjacent 18 mm simple
exophytic interpolar cyst is also noted on the left. A punctate
calcification is seen in the lower pole of the left kidney adjacent
to the dominant cyst. Malrotation of the right kidney since prior.
Complex 7 mm slightly exophytic cyst is noted arising from the right
kidney, smaller in appearance than on the previous study and likely
to represent a small complex proteinaceous or hemorrhagic cyst. No
worrisome features are identified given limitations of a noncontrast
study. No hydroureteronephrosis. The urinary bladder is
physiologically distended. No focal mural thickening or calculus
noted.

Stomach/Bowel: Stomach is within normal limits. Small hiatal hernia
noted. Appendix appears normal. No evidence of bowel wall
thickening, distention, or inflammatory changes.

Vascular/Lymphatic: Aortoiliac and branch vessel atherosclerosis. No
aneurysm or adenopathy.

Reproductive: Fibroid uterus without adnexal mass.

Other: No free air nor free fluid. Small fat containing umbilical
hernia.

Musculoskeletal: Acute biconcave osteoporotic compression fracture
of L1 is redemonstrated but appears worse, now with at least 50%
height loss centrally and with 7 mm of retropulsion off the superior
posterior aspect. Chronic degenerative disc disease L3-4 with
associated facet arthropathy.
IMPRESSION: 1. Worsening of biconcave osteoporotic compression fracture of L1,
now with 50% height loss centrally and with 7 mm of retropulsion
noted impressing upon the thecal sac and causing mild-to-moderate
central canal stenosis, sagittal series July 04, 63. Chronic degenerative disc disease L3-4.
3. Simple and complex renal cysts as above described as well as
hepatic hypodensities also likely to represent small cysts.
4. Fibroid uterus.
5. No acute bowel obstruction or inflammation.

## 2019-10-18 ENCOUNTER — Other Ambulatory Visit: Payer: Self-pay

## 2019-10-18 ENCOUNTER — Encounter: Payer: Self-pay | Admitting: Cardiology

## 2019-10-18 ENCOUNTER — Ambulatory Visit: Payer: Medicare PPO | Admitting: Cardiology

## 2019-10-18 VITALS — BP 145/69 | HR 59 | Resp 18 | Ht 62.0 in | Wt 131.2 lb

## 2019-10-18 DIAGNOSIS — R011 Cardiac murmur, unspecified: Secondary | ICD-10-CM | POA: Insufficient documentation

## 2019-10-18 DIAGNOSIS — R030 Elevated blood-pressure reading, without diagnosis of hypertension: Secondary | ICD-10-CM | POA: Insufficient documentation

## 2019-10-18 NOTE — Progress Notes (Signed)
Patient referred by Chesley Noon, MD for murmur  Subjective:   Hailey Fox, female    DOB: 1940/10/09, 79 y.o.   MRN: [id number]   Chief Complaint  Patient presents with  . Heart Murmur  . New Patient (Initial Visit)     HPI  79 y.o. Caucasian female with hypertension, hyperlipidemia, former smoker, referred for evaluation of murmur.  Patient was last seen by Dr. Einar Gip in 2015 and underwent echocardiogram exercise 7 stress test.  Details below.  Patient was recently found to have murmur on physical exam by PCP. She denies chest pain, shortness of breath, palpitations, leg edema, orthopnea, PND, TIA/syncope.   She is primary caregiver for her husband.   Blood pressure elevated today. Usually lower than this. She has appt with PCP next week.    Past Medical History:  Diagnosis Date  . Allergy   . Arthritis   . Cancer ([facility])    3 skin cancers removed. 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 Cataract    bil  . Complication of anesthesia    slow to wake up  . Dysrhythmia    PAC's   . GERD (gastroesophageal reflux disease)   . Osteoporosis   . PONV (postoperative nausea and vomiting)      Past Surgical History:  Procedure Laterality Date  . APPENDECTOMY  early 53's  . BREAST SURGERY Right 2003   palpiloma  . HERNIA REPAIR    . INGUINAL HERNIA REPAIR Left 08/18/2012   Procedure: LEFT INGUINAL HERNIA REPAIR;  Surgeon: Haywood Lasso, MD;  Location: [facility];  Service: General;  Laterality: Left;  . PARATHYROIDECTOMY  [id number]  . TONSILLECTOMY  1945  . TUBAL LIGATION  early 37's     Social History   Tobacco Use  Smoking Status Former Smoker  . Years: 15.00  Smokeless Tobacco Never Used  Tobacco Comment   quit smoking 1970's    Social History   Substance and Sexual Activity  Alcohol Use Yes  . Alcohol/week: 3.0 standard drinks  . Types: 3 Glasses of wine per week   Comment: rare     Family History  Problem Relation Age of Onset  . Cancer Paternal  Aunt        breast     Current Outpatient Medications on File Prior to Visit  Medication Sig Dispense Refill  . acetaminophen (TYLENOL) 500 MG tablet Take 500 mg by mouth 2 (two) times daily.    Marland Kitchen atorvastatin (LIPITOR) 10 MG tablet Take 10 mg by mouth at bedtime.     Marland Kitchen azelastine (ASTELIN) 137 MCG/SPRAY nasal spray Place 1 spray into the nose 2 (two) times daily.     . Calcium Carbonate-Vit D-Min (CALTRATE 600+D PLUS MINERALS) 600-800 MG-UNIT TABS Take 1 tablet by mouth daily.    . Cholecalciferol (VITAMIN D-3 PO) Take 2,000 mg by mouth daily.    . Coenzyme Q10 (COQ10 PO) Take 10 mLs by mouth daily after breakfast.    . denosumab (PROLIA) 60 MG/ML SOSY injection Inject into the skin.    Marland Kitchen KRILL OIL PO Take 1 tablet by mouth daily.    . melatonin 5 MG TABS Take by mouth.    . mometasone (ELOCON) 0.1 % lotion     . naproxen sodium (ALEVE) 220 MG tablet Take 220 mg by mouth 2 (two) times daily as needed (pain).     Marland Kitchen PREMARIN vaginal cream      No current facility-administered medications on  file prior to visit.    Cardiovascular and other pertinent studies:  EKG 10/18/2019: Sinus rhythm 60 bpm First degree A-V block    AAA screening 10/2019: Proximal aorta: 23 x 27 mm. Peak systolic velocity is 62 cm/s. Waveform is Normal.  Mid aorta: 18 x 19 mm.  Distal aorta: 18 x 21 mm.  Common iliac arteries: 11 mm.  Inferior vena cava: Normal where visible  Additional findings: None   Echocardiogram 2015: EF 50-55%.  Grade 1 diastolic dysfunction.  Trace pulmonary regurgitation.  Exercise treadmill stress test 2015: 7.3 METS.  No ischemic changes.  Occasional PAC, atrial bigeminy.   Recent labs: 09/23/2019: Glucose 91, BUN/Cr 19/0.9. EGFR 61. Na/K 140/4.4. Rest of the CMP normal H/H 12.8/40. MCV 90. Platelets 211 Chol 198, TG 67, HDL 78, LDL 108 TSH 1.4 normal    Review of Systems  Cardiovascular: Negative for chest pain, dyspnea on exertion, leg swelling, palpitations  and syncope.         Vitals:   10/18/19 0934  BP: (!) 171/85  Pulse: 70  Resp: 18  SpO2: 100%     Body mass index is 24 kg/m. Filed Weights   10/18/19 0934  Weight: 131 lb 3.2 oz (59.5 kg)     Objective:   Physical Exam Vitals and nursing note reviewed.  Constitutional:      General: She is not in acute distress. Neck:     Vascular: No JVD.  Cardiovascular:     Rate and Rhythm: Normal rate and regular rhythm.     Heart sounds: Murmur heard.  Harsh midsystolic murmur is present with a grade of 2/6 at the upper right sternal border radiating to the neck.   Pulmonary:     Effort: Pulmonary effort is normal.     Breath sounds: Normal breath sounds. No wheezing or rales.         Assessment & Recommendations:   79 y.o. Caucasian female with hypertension, hyperlipidemia, former smoker, referred for evaluation of murmur.  Murmur: Will obtain echocardiogram  Elevated blood pressure without diagnosis of hypertension: Suspect white coat hypertension. Follow up with PCP.  Further recommendations after above testing     Thank you for referring the patient to [place]. Please feel free to contact with any questions.  Nigel Mormon, MD

## 2019-10-25 ENCOUNTER — Other Ambulatory Visit: Payer: Self-pay

## 2019-10-25 ENCOUNTER — Ambulatory Visit: Payer: Medicare PPO

## 2019-10-25 DIAGNOSIS — R011 Cardiac murmur, unspecified: Secondary | ICD-10-CM

## 2019-12-14 ENCOUNTER — Emergency Department (HOSPITAL_COMMUNITY)
Admission: EM | Admit: 2019-12-14 | Discharge: 2019-12-15 | Disposition: A | Discharge status: Home / Self Care | Payer: Medicare PPO | Attending: Student | Admitting: Student

## 2019-12-14 ENCOUNTER — Ambulatory Visit: Payer: Medicare PPO | Attending: Critical Care Medicine

## 2019-12-14 DIAGNOSIS — L299 Pruritus, unspecified: Secondary | ICD-10-CM | POA: Diagnosis not present

## 2019-12-14 DIAGNOSIS — Z23 Encounter for immunization: Secondary | ICD-10-CM

## 2019-12-14 DIAGNOSIS — R21 Rash and other nonspecific skin eruption: Secondary | ICD-10-CM | POA: Diagnosis present

## 2019-12-14 DIAGNOSIS — Z5321 Procedure and treatment not carried out due to patient leaving prior to being seen by health care provider: Secondary | ICD-10-CM | POA: Insufficient documentation

## 2019-12-14 NOTE — Progress Notes (Signed)
   Covid-19 Vaccination Clinic  Name:  Hailey Fox    MRN: [id number] DOB: 02/16/1941  12/14/2019  Hailey Fox was observed post Covid-19 immunization for 15 minutes without incident. She was provided with Vaccine Information Sheet and instruction to access the V-Safe system.   Hailey Fox was instructed to call 911 with any severe reactions post vaccine: Marland Kitchen Difficulty breathing  . Swelling of face and throat  . A fast heartbeat  . A bad rash all over body  . Dizziness and weakness

## 2019-12-15 ENCOUNTER — Encounter (HOSPITAL_COMMUNITY): Payer: Self-pay | Admitting: Emergency Medicine

## 2019-12-15 ENCOUNTER — Other Ambulatory Visit: Payer: Self-pay

## 2019-12-15 MED ORDER — DIPHENHYDRAMINE HCL 25 MG PO CAPS: 25.0000 mg | ORAL_CAPSULE | Freq: Once | ORAL | Status: DC

## 2019-12-15 NOTE — ED Triage Notes (Signed)
Pt presents to ED POV. Pt c/o itching and redness around perineal area. Pt reports that she got booster shot for COVID today and thinks she is having an allergic reaction. Pt also c/o of throat "tingling" but denies swelling and/or difficulty breathing.

## 2019-12-15 NOTE — ED Notes (Signed)
Pt said she was given BENADRYL and she felt better and wanted to leave and go home before she got too tired to drive. Pt advised to come back if symptoms returned.

## 2020-04-11 NOTE — Progress Notes (Signed)
Patient referred by Chesley Noon, MD for murmur  Subjective:   Hailey Fox, female    DOB: Mar 06, 1940, 80 y.o.   MRN: [id number]   Chief Complaint  Patient presents with  . Tachycardia  . faint pulse  . Dizziness     HPI  80 y.o. Caucasian female with hypertension, hyperlipidemia, former smoker, and known PACs. Originally referred to our office for evaluation of murmur. She underwent low risk stress testing in 2015 and echocardiogram in 2021 revealing LVEF 25%, diastolic dysfunction, and mild AR and moderate MR. Patient has known first-degree AV block.  Patient reports she is enrolled in ARIC clinical study at [facility], although it is unclear what the study is for.   Patient presents for urgent visit with concerns of heart racing symptoms and lightheadedness. In the last 1.5 weeks patient reports two episodes of heart racing symptoms.  She reports the first episode woke her up and she experienced heart racing with associated imbalance and lightheadedness lasting approximately 2 hours.  Reports a second episode of waking up with heart racing symptoms and feeling that her heart rhythm was irregular, this episode lasted several minutes.  Denies chest pain, dyspnea, leg edema, orthopnea, PND, syncope, near syncope, symptoms suggestive of TIA/CVA.  Patient reports the symptoms are different from the palpitation she feels with PACs.  Patient monitors her blood pressure at home reports readings averaging 132/80 mmHg.  Notably patient was diagnosed with [facility] spotted fever 11/16/2019 and treated.   Past Medical History:  Diagnosis Date  . Allergy   . Arthritis   . Cancer ([place])    3 skin cancers removed. 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 Cataract    bil  . Complication of anesthesia    slow to wake up  . Dysrhythmia    PAC's   . GERD (gastroesophageal reflux disease)   . Osteoporosis   . PONV (postoperative nausea and vomiting)      Past Surgical History:   Procedure Laterality Date  . APPENDECTOMY  early 54's  . BREAST SURGERY Right 2003   palpiloma  . HERNIA REPAIR    . INGUINAL HERNIA REPAIR Left 08/18/2012   Procedure: LEFT INGUINAL HERNIA REPAIR;  Surgeon: Haywood Lasso, MD;  Location: [place];  Service: General;  Laterality: Left;  . PARATHYROIDECTOMY  [id number]  . TONSILLECTOMY  1945  . TUBAL LIGATION  early 20's     Social History   Tobacco Use  Smoking Status Former Smoker  . Packs/day: 1.00  . Years: 15.00  . Pack years: 15.00  . Types: Cigarettes  . Quit date: 70  . Years since quitting: 52.1  Smokeless Tobacco Never Used  Tobacco Comment   quit smoking 1970's    Social History   Substance and Sexual Activity  Alcohol Use Yes  . Alcohol/week: 3.0 standard drinks  . Types: 3 Glasses of wine per week   Comment: rare     Family History  Problem Relation Age of Onset  . Cancer Paternal Aunt        breast     Current Outpatient Medications on File Prior to Visit  Medication Sig Dispense Refill  . acetaminophen (TYLENOL) 500 MG tablet Take 500 mg by mouth 2 (two) times daily.    Marland Kitchen atorvastatin (LIPITOR) 10 MG tablet Take 10 mg by mouth at bedtime.     . Biotin 1 MG CAPS Take 1 mg by mouth daily.    Marland Kitchen  Calcium Carbonate-Vit D-Min (CALTRATE 600+D PLUS MINERALS) 600-800 MG-UNIT TABS Take 1 tablet by mouth daily.    . Cholecalciferol (VITAMIN D-3 PO) Take 2,000 mg by mouth daily.    . Coenzyme Q10 (COQ10 PO) Take 10 mLs by mouth daily after breakfast.    . denosumab (PROLIA) 60 MG/ML SOSY injection Inject into the skin.    Marland Kitchen KRILL OIL PO Take 1 tablet by mouth daily.    . melatonin 5 MG TABS Take by mouth.    . mometasone (ELOCON) 0.1 % lotion     . naproxen sodium (ALEVE) 220 MG tablet Take 220 mg by mouth 2 (two) times daily as needed (pain).     Marland Kitchen PREMARIN vaginal cream     . azelastine (ASTELIN) 137 MCG/SPRAY nasal spray Place 1 spray into the nose 2 (two) times daily.      No current  facility-administered medications on file prior to visit.    Cardiovascular and other pertinent studies:  EKG 04/12/2020: Sinus bradycardia with first-degree AV block at a rate of 57 bpm, left atrial enlargement.  Normal axis.  Incomplete right bundle branch block.  Compared to EKG 10/18/2019, no significant change.  PCV ECHOCARDIOGRAM COMPLETE 10/25/2019 Left ventricle cavity is normal in size. Mild concentric hypertrophy of the left ventricle. Normal global wall motion. Normal LV systolic function with EF 55%. Doppler evidence of grade I (impaired) diastolic dysfunction, normal LAP. Left atrial cavity is mildly dilated. Trileaflet aortic valve.  Mild (Grade I) aortic regurgitation. Moderate (Grade II) mitral regurgitation. Mild to moderate tricuspid regurgitation. Estimated pulmonary artery systolic pressure 28 mmHg. Compared to previous study in 2015, mild increase in severity of mitral and tricuspid regurgitation.  AAA screening 10/2019: Proximal aorta: 23 x 27 mm. Peak systolic velocity is 62 cm/s. Waveform is Normal.  Mid aorta: 18 x 19 mm.  Distal aorta: 18 x 21 mm.  Common iliac arteries: 11 mm.  Inferior vena cava: Normal where visible  Additional findings: None  Echocardiogram 2015: EF 50-55%.  Grade 1 diastolic dysfunction.  Trace pulmonary regurgitation.  Exercise treadmill stress test 2015: 7.3 METS.  No ischemic changes.  Occasional PAC, atrial bigeminy.   Recent labs: 11/16/2019: Hemoglobin 13.1, hematocrit 39.2, MCV 80, platelets 231 glucose 131, creatinine 0.93, GFR 58, BUN 18, sodium 138, potassium 3.7 Magnesium 1.8  09/23/2019: Glucose 91, BUN/Cr 19/0.9. EGFR 61. Na/K 140/4.4. Rest of the CMP normal H/H 12.8/40. MCV 90. Platelets 211 Chol 198, TG 67, HDL 78, LDL 108 TSH 1.4 normal    Review of Systems  Constitutional: Negative for malaise/fatigue and weight gain.  Cardiovascular: Positive for palpitations. Negative for chest pain, claudication, dyspnea  on exertion, leg swelling, near-syncope, orthopnea, paroxysmal nocturnal dyspnea and syncope.  Respiratory: Negative for shortness of breath.   Hematologic/Lymphatic: Does not bruise/bleed easily.  Gastrointestinal: Negative for melena.  Neurological: Positive for light-headedness and loss of balance. Negative for dizziness and weakness.         Vitals:   04/12/20 0848 04/12/20 0849  BP:    Pulse:    Resp:    Temp:    SpO2: 99% 99%     Body mass index is 23.19 kg/m. Filed Weights   04/12/20 0838  Weight: 126 lb 12.8 oz (57.5 kg)     Objective:   Physical Exam Vitals and nursing note reviewed.  Constitutional:      General: She is not in acute distress. HENT:     Head: Normocephalic and atraumatic.  Neck:  Vascular: No JVD.  Cardiovascular:     Rate and Rhythm: Regular rhythm. Bradycardia present.     Pulses: Intact distal pulses.     Heart sounds: S1 normal and S2 normal. Murmur heard.  High-pitched blowing holosystolic murmur is present with a grade of 2/6 at the apex. No gallop.   Pulmonary:     Effort: Pulmonary effort is normal. No respiratory distress.     Breath sounds: Normal breath sounds. No wheezing, rhonchi or rales.  Musculoskeletal:     Right lower leg: No edema.     Left lower leg: No edema.  Skin:    General: Skin is warm and dry.     Capillary Refill: Capillary refill takes less than 2 seconds.  Neurological:     General: No focal deficit present.     Mental Status: She is alert and oriented to person, place, and time.         Assessment & Recommendations:     ICD-10-CM   1. Palpitations  R00.2 EKG 12-Lead    LONG TERM MONITOR (3-14 DAYS)  2. Lightheadedness  R42 LONG TERM MONITOR (3-14 DAYS)  3. Nonrheumatic mitral valve regurgitation  I34.0   4. 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 Elevated blood pressure reading without diagnosis of hypertension  R03.0     80 y.o. Caucasian female with hypertension,  hyperlipidemia, former smoker, and known PACs. Originally referred to our office for evaluation of murmur. She underwent low risk stress testing in 2015 and echocardiogram in 2021 revealing LVEF 97%, diastolic dysfunction, and mild AR and moderate MR. Patient has known first-degree AV block.  1. Palpitations Patient has had 2 episodes of palpitations in the last 1.5 weeks with associated lightheadedness and imbalance. Patient reports these symptoms are unlike previous symptoms associated with known PACs. Will obtain cardiac monitor to further evaluate for underlying cardiac arrhythmias.   2. Lightheadedness Managed as above.   3. Nonrheumatic mitral valve regurgitation Moderate per echocardiogram in 10/2019.  Will continue to monitor as clinically indicated   4. Nonrheumatic aortic valve insufficiency Mild per echocardiogram in 10/2019.  Will continue to monitor as clinically indicated.   5. Elevated blood pressure reading without diagnosis of hypertension Patient's home blood pressure readings are well controlled. Advised patient to notify the office if her home blood pressure readings are consistently >130/980 mmHg. She verbalized understanding and agreement.  Will defer further management to primary care at this time.   Follow up in 6 weeks, sooner if needed, for palpitations.    Alethia Berthold, PA-C 04/12/2020, 10:55 AM Office: [phone]

## 2020-04-12 ENCOUNTER — Inpatient Hospital Stay: Payer: Medicare PPO

## 2020-04-12 ENCOUNTER — Encounter: Payer: Self-pay | Admitting: Student

## 2020-04-12 ENCOUNTER — Other Ambulatory Visit: Payer: Self-pay

## 2020-04-12 ENCOUNTER — Ambulatory Visit: Payer: Medicare PPO | Admitting: Student

## 2020-04-12 VITALS — BP 139/74 | HR 61 | Temp 98.0°F | Resp 17 | Ht 62.0 in | Wt 126.8 lb

## 2020-04-12 DIAGNOSIS — R030 Elevated blood-pressure reading, without diagnosis of hypertension: Secondary | ICD-10-CM

## 2020-04-12 DIAGNOSIS — R002 Palpitations: Secondary | ICD-10-CM

## 2020-04-12 DIAGNOSIS — R42 Dizziness and giddiness: Secondary | ICD-10-CM

## 2020-04-12 DIAGNOSIS — I351 Nonrheumatic aortic (valve) insufficiency: Secondary | ICD-10-CM

## 2020-04-12 DIAGNOSIS — I340 Nonrheumatic mitral (valve) insufficiency: Secondary | ICD-10-CM

## 2020-04-24 ENCOUNTER — Telehealth: Payer: Self-pay

## 2020-04-24 NOTE — Telephone Encounter (Signed)
Patient called our office to report over the last 2 days she has been experiencing worsening shortness of breath, symptoms do not get worse with exertion.  She has also had an episode of lightheadedness lasting several seconds, without loss of consciousness.  She is currently undergoing evaluation of palpitations, wearing an event monitor.  She reports she has had no recurrence of palpitations since last office visit.  Denies chest pain, dizziness, syncope, leg swelling, orthopnea, PND.  Echocardiogram in August 4720 revealed diastolic dysfunction.  Suspicion for ACS is low, however provided slinging caution regarding signs and symptoms that warrant urgent evaluation in the emergency department, patient verbalized understanding agreement.  Patient reports her blood pressure today is 120/78 mmHg and 60 bpm.  Do not suspect bradycardia or hypotension to be contributing to patient's symptoms.  Will schedule for follow-up visit in the office tomorrow to further evaluate etiology of shortness of breath.

## 2020-04-24 NOTE — Telephone Encounter (Signed)
Pt bp was 120/78 hr 60

## 2020-04-24 NOTE — Progress Notes (Signed)
Patient referred by Chesley Noon, MD for murmur  Subjective:   Hailey Fox, female    DOB: [birth year], 80 y.o.   MRN: [id number]   Chief Complaint  Patient presents with  . Shortness of Breath  . Follow-up     HPI  80 y.o. Caucasian female with hypertension, hyperlipidemia, former smoker, and known PACs. Originally referred to our office for evaluation of murmur. She underwent low risk stress testing in 2015 and echocardiogram in 2021 revealing LVEF 93%, diastolic dysfunction, and mild AR and moderate MR. Patient has known first-degree AV block.  Patient reports she is enrolled in ARIC clinical study at [facility], although it is unclear what the study is for.   Patient monitors her blood pressure at home reports readings averaging 132/80 mmHg.  Notably patient was diagnosed with [facility] spotted fever 11/16/2019 and treated.  Patient now presents with urgent complaints of shortness of breath over the last 2 days.  She has noticed dyspnea on exertion over the last 2 days as well as fatigue.  Denies chest pain, orthopnea, PND.  States bilateral lower leg swelling is stable.  She does continue to have occasional episodes of palpitations which are similar to her known symptoms of PACs.  She also Will she also reports occasional occasional episodes of dizziness particularly in the mornings when she is getting ready which last a few minutes and resolve spontaneously.  Patient's blood pressure and heart rate have been well controlled at home.  Notably patient does admit to snoring, but has not undergone previously scheduled sleep study.   Past Medical History:  Diagnosis Date  . Allergy   . Arthritis   . Cancer ([place])    3 skin cancers removed. 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 Cataract    bil  . Complication of anesthesia    slow to wake up  . Dysrhythmia    PAC's   . GERD (gastroesophageal reflux disease)   . Osteoporosis   . PONV (postoperative nausea and vomiting)       Past Surgical History:  Procedure Laterality Date  . APPENDECTOMY  early 98's  . BREAST SURGERY Right 2003   palpiloma  . HERNIA REPAIR    . INGUINAL HERNIA REPAIR Left 08/18/2012   Procedure: LEFT INGUINAL HERNIA REPAIR;  Surgeon: Haywood Lasso, MD;  Location: [place];  Service: General;  Laterality: Left;  . PARATHYROIDECTOMY  [id number]  . TONSILLECTOMY  1945  . TUBAL LIGATION  early 63's     Social History   Tobacco Use  Smoking Status Former Smoker  . Packs/day: 1.00  . Years: 15.00  . Pack years: 15.00  . Types: Cigarettes  . Quit date: 61  . Years since quitting: 52.1  Smokeless Tobacco Never Used  Tobacco Comment   quit smoking 1970's    Social History   Substance and Sexual Activity  Alcohol Use Yes  . Alcohol/week: 3.0 standard drinks  . Types: 3 Glasses of wine per week   Comment: rare     Family History  Problem Relation Age of Onset  . Cancer Paternal Aunt        breast     Current Outpatient Medications on File Prior to Visit  Medication Sig Dispense Refill  . acetaminophen (TYLENOL) 500 MG tablet Take 500 mg by mouth 2 (two) times daily.    Marland Kitchen atorvastatin (LIPITOR) 10 MG tablet Take 10 mg by mouth at bedtime.     Marland Kitchen  Biotin 1 MG CAPS Take 1 mg by mouth daily.    . Calcium Carbonate-Vit D-Min (CALTRATE 600+D PLUS MINERALS) 600-800 MG-UNIT TABS Take 1 tablet by mouth daily.    . Cholecalciferol (VITAMIN D-3 PO) Take 2,000 mg by mouth daily.    . Coenzyme Q10 (COQ10 PO) Take 10 mLs by mouth daily after breakfast.    . denosumab (PROLIA) 60 MG/ML SOSY injection Inject into the skin.    Marland Kitchen KRILL OIL PO Take 1 tablet by mouth daily.    . melatonin 5 MG TABS Take by mouth.    . mometasone (ELOCON) 0.1 % lotion     . naproxen sodium (ALEVE) 220 MG tablet Take 220 mg by mouth 2 (two) times daily as needed (pain).     Marland Kitchen PREMARIN vaginal cream      No current facility-administered medications on file prior to visit.    Cardiovascular and other  pertinent studies: EKG 04/25/2020: Sinus rhythm with first-degree AV block at a rate of 62 bpm.  Left atrial enlargement.  Normal axis. No underlying ischemia or injury pattern. Compared to EKG 04/12/2020, no significant change.  PCV ECHOCARDIOGRAM COMPLETE 10/25/2019 Left ventricle cavity is normal in size. Mild concentric hypertrophy of the left ventricle. Normal global wall motion. Normal LV systolic function with EF 55%. Doppler evidence of grade I (impaired) diastolic dysfunction, normal LAP. Left atrial cavity is mildly dilated. Trileaflet aortic valve.  Mild (Grade I) aortic regurgitation. Moderate (Grade II) mitral regurgitation. Mild to moderate tricuspid regurgitation. Estimated pulmonary artery systolic pressure 28 mmHg. Compared to previous study in 2015, mild increase in severity of mitral and tricuspid regurgitation.  AAA screening 10/2019: Proximal aorta: 23 x 27 mm. Peak systolic velocity is 62 cm/s. Waveform is Normal.  Mid aorta: 18 x 19 mm.  Distal aorta: 18 x 21 mm.  Common iliac arteries: 11 mm.  Inferior vena cava: Normal where visible  Additional findings: None  Echocardiogram 2015: EF 50-55%.  Grade 1 diastolic dysfunction.  Trace pulmonary regurgitation.  Exercise treadmill stress test 2015: 7.3 METS.  No ischemic changes.  Occasional PAC, atrial bigeminy.   Recent labs: 11/16/2019: Hemoglobin 13.1, hematocrit 39.2, MCV 80, platelets 231 glucose 131, creatinine 0.93, GFR 58, BUN 18, sodium 138, potassium 3.7 Magnesium 1.8  09/23/2019: Glucose 91, BUN/Cr 19/0.9. EGFR 61. Na/K 140/4.4. Rest of the CMP normal H/H 12.8/40. MCV 90. Platelets 211 Chol 198, TG 67, HDL 78, LDL 108 TSH 1.4 normal    Review of Systems  Constitutional: Positive for malaise/fatigue. Negative for weight gain.  Cardiovascular: Positive for dyspnea on exertion and palpitations. Negative for chest pain, claudication, leg swelling, near-syncope, orthopnea, paroxysmal nocturnal dyspnea  and syncope.  Hematologic/Lymphatic: Does not bruise/bleed easily.  Gastrointestinal: Negative for melena.  Neurological: Positive for dizziness. Negative for loss of balance and weakness.         Vitals:   04/25/20 0908  BP: 136/63  Pulse: 65  SpO2: 98%     Body mass index is 23.05 kg/m. Filed Weights   04/25/20 0908  Weight: 126 lb (57.2 kg)    Orthostatic VS for the past 72 hrs (Last 3 readings):  Patient Position BP Location Cuff Size  04/25/20 0908 Sitting Left Arm Normal     Objective:   Physical Exam Vitals and nursing note reviewed.  Constitutional:      General: She is not in acute distress. HENT:     Head: Normocephalic and atraumatic.  Neck:     Vascular: No  JVD.  Cardiovascular:     Rate and Rhythm: Regular rhythm. Bradycardia present.     Pulses: Intact distal pulses.     Heart sounds: S1 normal and S2 normal. Murmur heard.  High-pitched blowing holosystolic murmur is present with a grade of 2/6 at the apex. No gallop.   Pulmonary:     Effort: Pulmonary effort is normal. No respiratory distress.     Breath sounds: Normal breath sounds. No wheezing, rhonchi or rales.  Musculoskeletal:     Right lower leg: No edema.     Left lower leg: No edema.  Skin:    General: Skin is warm and dry.     Capillary Refill: Capillary refill takes less than 2 seconds.  Neurological:     General: No focal deficit present.     Mental Status: She is alert and oriented to person, place, and time.         Assessment & Recommendations:     ICD-10-CM   1. DOE (dyspnea on exertion)  R06.00 EKG 12-Lead    CBC    TSH    Basic metabolic panel    Brain natriuretic peptide  2. Snoring  R06.83 Ambulatory referral to Sleep Studies  3. Other fatigue  R53.83 Ambulatory referral to Sleep Studies    TSH    Basic metabolic panel    Brain natriuretic peptide  4. Palpitations  R00.2   5. Nonrheumatic mitral valve regurgitation  I34.0   6. Nonrheumatic aortic valve  insufficiency  I35.1     79 y.o. Caucasian female with hypertension, hyperlipidemia, former smoker, and known PACs. Originally referred to our office for evaluation of murmur. She underwent low risk stress testing in 2015 and echocardiogram in 2021 revealing LVEF 55%, diastolic dysfunction, and mild AR and moderate MR. Patient has known first-degree AV block.  1. DOE (dyspnea on exertion) Differential diagnosis for patient's symptoms is wide including underlying cardiac arrhythmia.  She is currently wearing an live cardiac monitor, will await results in order to discern if cardiac arrhythmias contributing to dyspnea on exertion.  We will also obtain BMP, BNP, TSH, CBC to evaluate for underlying etiology of dyspnea on exertion.  Patient is euvolemic on exam without clinical signs of heart failure.  EKG remains unchanged compared to previous without ischemia or injury pattern.  Therefore my suspicion for ischemia to the heart muscle is low.  However could consider repeat stress test in the future if symptoms worsen or fail to improve.  Patient does also have 12-15-year history of smoking approximately pack per day.  She denies history of COPD or asthma, however underlying pulmonary valve etiology of symptoms cannot be excluded.  Deconditioning may be contributing to patient's symptoms as well as she has been less active over the last several weeks.  Counseled patient regarding signs and symptoms that would warrant urgent evaluation in the emergency department, she verbalized understanding agreement.  2. Snoring Patient does admit to snoring as well as daytime fatigue.  She had previously been referred for sleep evaluation but did not follow-up and therefore has never been evaluated for underlying sleep apnea.  Will refer for sleep study at this time.  3. Other fatigue Evaluation and management per above.  4. Palpitations Patient is presently wearing nonlive cardiac monitor, results pending.  Further  recommendations to follow.  5. Nonrheumatic mitral valve regurgitation Moderate per echocardiogram in 10/2019.  Will continue to monitor as clinically indicated.  Although underlying valvular disease may be contributing to patient's current  symptoms, will hold off on repeat echocardiogram at this time although could consider it in the future.  6. Nonrheumatic aortic valve insufficiency Mild per echocardiogram in 10/2019.  Will continue to monitor as clinically indicated. Although underlying valvular disease may be contributing to patient's current symptoms, will hold off on repeat echocardiogram at this time although could consider it in the future.  Follow-up in 4 weeks, sooner if needed, for palpitations and dyspnea on exertion.   Alethia Berthold, PA-C 04/25/2020, 12:55 PM Office: [phone]

## 2020-04-25 ENCOUNTER — Other Ambulatory Visit: Payer: Self-pay

## 2020-04-25 ENCOUNTER — Encounter: Payer: Self-pay | Admitting: Student

## 2020-04-25 ENCOUNTER — Ambulatory Visit: Payer: Medicare PPO | Admitting: Student

## 2020-04-25 VITALS — BP 136/63 | HR 65 | Ht 62.0 in | Wt 126.0 lb

## 2020-04-25 DIAGNOSIS — R0609 Other forms of dyspnea: Secondary | ICD-10-CM

## 2020-04-25 DIAGNOSIS — I351 Nonrheumatic aortic (valve) insufficiency: Secondary | ICD-10-CM

## 2020-04-25 DIAGNOSIS — I340 Nonrheumatic mitral (valve) insufficiency: Secondary | ICD-10-CM

## 2020-04-25 DIAGNOSIS — R5383 Other fatigue: Secondary | ICD-10-CM

## 2020-04-25 DIAGNOSIS — R002 Palpitations: Secondary | ICD-10-CM

## 2020-04-25 DIAGNOSIS — R0602 Shortness of breath: Secondary | ICD-10-CM

## 2020-04-25 DIAGNOSIS — R0683 Snoring: Secondary | ICD-10-CM

## 2020-04-25 DIAGNOSIS — R0600 Dyspnea, unspecified: Secondary | ICD-10-CM

## 2020-04-25 NOTE — Patient Instructions (Signed)
Please give patient address for [employer]

## 2020-04-26 LAB — CBC
Hematocrit: 42.5 % (ref 34.0–46.6)
Hemoglobin: 14 g/dL (ref 11.1–15.9)
MCH: 29.4 pg (ref 26.6–33.0)
MCHC: 32.9 g/dL (ref 31.5–35.7)
MCV: 89 fL (ref 79–97)
Platelets: 256 10*3/uL (ref 150–450)
RBC: 4.76 x10E6/uL (ref 3.77–5.28)
RDW: 12.8 % (ref 11.7–15.4)
WBC: 5.6 10*3/uL (ref 3.4–10.8)

## 2020-04-26 LAB — BASIC METABOLIC PANEL
BUN/Creatinine Ratio: 11 — ABNORMAL LOW (ref 12–28)
BUN: 11 mg/dL (ref 8–27)
CO2: 24 mmol/L (ref 20–29)
Calcium: 10.5 mg/dL — ABNORMAL HIGH (ref 8.7–10.3)
Chloride: 98 mmol/L (ref 96–106)
Creatinine, Ser: 0.99 mg/dL (ref 0.57–1.00)
GFR calc Af Amer: 62 mL/min/{1.73_m2} (ref >59)
GFR calc non Af Amer: 54 mL/min/{1.73_m2} — ABNORMAL LOW (ref >59)
Glucose: 77 mg/dL (ref 65–99)
Potassium: 4.1 mmol/L (ref 3.5–5.2)
Sodium: 140 mmol/L (ref 134–144)

## 2020-04-26 LAB — TSH: TSH: 1.46 u[IU]/mL (ref 0.450–4.500)

## 2020-04-26 LAB — BRAIN NATRIURETIC PEPTIDE: BNP: 46.3 pg/mL (ref 0.0–100.0)

## 2020-04-28 NOTE — Progress Notes (Signed)
Attempted to call pt, no answer. Left vm on MOBILE phone requesting call back.

## 2020-04-28 NOTE — Progress Notes (Signed)
Please inform patient CBC, TSH (thyroid function test), and BNP (measure of heart failure) are all normal.  Kidney function electrolytes stable.

## 2020-05-01 NOTE — Progress Notes (Signed)
Called and spoke with pt regarding lab results. Pt voiced understanding.

## 2020-05-10 NOTE — Progress Notes (Signed)
Spoke with patient, she is aware of monitor results.  We will discuss further at upcoming office visit.

## 2020-05-23 NOTE — Progress Notes (Signed)
Patient referred by Chesley Noon, MD for murmur  Subjective:   Hailey Fox, female    DOB: 05/23/1940, 80 y.o.   MRN: [id number]   Chief Complaint  Patient presents with  . Palpitations  . DOE  . Follow-up    6 week  . Results    monitor     HPI  80 y.o. Caucasian female with hypertension, hyperlipidemia, former smoker, and known PACs. Originally referred to our office for evaluation of murmur. She underwent low risk stress testing in 2015 and echocardiogram in 2021 revealing LVEF 78%, diastolic dysfunction, and mild AR and moderate MR. Patient has known first-degree AV block.  Patient reports she is enrolled in ARIC clinical study at [facility], although it is unclear what the study is for. Notably patient was diagnosed with [facility] [place] spotted fever 11/16/2019 and treated.  Patient presents for 4-week follow-up of palpitations and dyspnea on exertion.  Since last visit she underwent ambulatory cardiac telemetry which revealed symptomatic PACs and PVCs.  Recent BNP, CBC, TSH, and BMP were all within normal limits.  Patient reports symptoms of palpitations have returned to previous baseline, occurring occasionally and not bothersome to patient.  Patient continues to prefer to hold off on additional medical therapy for treatment of palpitation symptoms.  Patient also states dizziness and dyspnea on exertion are both significantly improved since last visit, however she does continue to have mild dyspnea on exertion, particularly when she has to walk stairs multiple times in the room.  Patient has been monitoring her blood pressure at home reporting readings 159/85-118/70 mmHg, averaging about 140/75 mmHg.   Patient has also been following with PCP and pulmonology for evaluation of dyspnea on exertion.  Patient had recent CT of the chest done which incidentally found 4 cm ascending aortic aneurysm, she is presently scheduled to see thoracic surgeon and will follow at the end  of this month for evaluation.  CT of the chest also noted biatrial enlargement and coronary artery calcifications.  Patient denies chest pain, syncope, near syncope, leg swelling, orthopnea, PND.  She is presently scheduled for sleep evaluation tomorrow.  Past Medical History:  Diagnosis Date  . Allergy   . Arthritis   . Cancer ([place])    3 skin cancers removed. 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 Cataract    bil  . Complication of anesthesia    slow to wake up  . Dysrhythmia    PAC's   . GERD (gastroesophageal reflux disease)   . Osteoporosis   . PONV (postoperative nausea and vomiting)      Past Surgical History:  Procedure Laterality Date  . APPENDECTOMY  early 35's  . BREAST SURGERY Right 2003   palpiloma  . HERNIA REPAIR    . INGUINAL HERNIA REPAIR Left 08/18/2012   Procedure: LEFT INGUINAL HERNIA REPAIR;  Surgeon: Haywood Lasso, MD;  Location: [facility];  Service: General;  Laterality: Left;  . PARATHYROIDECTOMY  [id number]  . TONSILLECTOMY  1945  . TUBAL LIGATION  early 89's     Social History   Tobacco Use  Smoking Status Former Smoker  . Packs/day: 1.00  . Years: 15.00  . Pack years: 15.00  . Types: Cigarettes  . Quit date: 59  . Years since quitting: 52.2  Smokeless Tobacco Never Used  Tobacco Comment   quit smoking 1970's    Social History   Substance and Sexual Activity  Alcohol Use Yes  . Alcohol/week: 3.0 standard  drinks  . Types: 3 Glasses of wine per week   Comment: rare     Family History  Problem Relation Age of Onset  . Cancer Paternal Aunt        breast     Current Outpatient Medications on File Prior to Visit  Medication Sig Dispense Refill  . acetaminophen (TYLENOL) 500 MG tablet Take 500 mg by mouth 2 (two) times daily.    . Biotin 1 MG CAPS Take 1 mg by mouth daily.    . Calcium Carbonate-Vit D-Min (CALTRATE 600+D PLUS MINERALS) 600-800 MG-UNIT TABS Take 1 tablet by mouth daily.    . Cetirizine HCl (ZYRTEC ALLERGY PO) Take by mouth.     . Cholecalciferol (VITAMIN D-3 PO) Take 2,000 mg by mouth daily.    . Coenzyme Q10 (COQ10 PO) Take 10 mLs by mouth daily after breakfast.    . denosumab (PROLIA) 60 MG/ML SOSY injection Inject into the skin.    Marland Kitchen KRILL OIL PO Take 1 tablet by mouth daily.    . melatonin 5 MG TABS Take by mouth.    . mometasone (ELOCON) 0.1 % lotion     . PREMARIN vaginal cream     . rosuvastatin (CRESTOR) 10 MG tablet Take 1 tablet by mouth daily.     No current facility-administered medications on file prior to visit.    Cardiovascular and other pertinent studies:  CT Chest w/o IV contrast 05/02/2020:  No acute findings in the chest  Mild biatrial enlargement and coronary artery calcifications  4.0 cm aneurysm of the ascending aorta  1.3 cm simple cyst in the left hepatic lobe. Small left renal cysts  Severe remote L1 compression fracture   Ambulatory cardiac telemetry 04/12/2020-04/26/2020: Notably patient wore 2 separate monitors over the course of 14 days, for a total analysis time of 12 days 22 hours.  Predominant underlying rhythm was sinus with first-degree AV block.  Minimum heart rate of 21 bpm, maximum heart rate of 74 bpm, average heart rate of 60-67 bpm.  Patient with 74 episodes of supraventricular tachycardia, including episodes suggestive rather of sinus tachycardia and some episodes rather suggestive of atrial tachycardia, with the fastest lasting 5 beats, maximum rate 174 bpm and longest lasting 6 minutes.  Patient with 1 sinus pause was >3 seconds, lasting 4 seconds occurring at night and asymptomatic.  Intermittent second-degree AV block Mobitz type II present, patient asymptomatic.  Isolated PACs were occasional, with PAC couplets/triplets as well as isolated PVCs and PVC couplets were all rare.  Ventricular bigeminy and trigeminy were present. No ventricular tachycardia, atrial fibrillation.  Patient symptoms correlated with sinus rhythm, PACs and PVCs.  EKG 04/25/2020: Sinus rhythm with  first-degree AV block at a rate of 62 bpm.  Left atrial enlargement.  Normal axis. No underlying ischemia or injury pattern. Compared to EKG 04/12/2020, no significant change.  PCV ECHOCARDIOGRAM COMPLETE 10/25/2019 Left ventricle cavity is normal in size. Mild concentric hypertrophy of the left ventricle. Normal global wall motion. Normal LV systolic function with EF 55%. Doppler evidence of grade I (impaired) diastolic dysfunction, normal LAP. Left atrial cavity is mildly dilated. Trileaflet aortic valve.  Mild (Grade I) aortic regurgitation. Moderate (Grade II) mitral regurgitation. Mild to moderate tricuspid regurgitation. Estimated pulmonary artery systolic pressure 28 mmHg. Compared to previous study in 2015, mild increase in severity of mitral and tricuspid regurgitation.  AAA screening 10/2019: Proximal aorta: 23 x 27 mm. Peak systolic velocity is 62 cm/s. Waveform is Normal.  Mid aorta:  18 x 19 mm.  Distal aorta: 18 x 21 mm.  Common iliac arteries: 11 mm.  Inferior vena cava: Normal where visible  Additional findings: None  Echocardiogram 2015: EF 50-55%.  Grade 1 diastolic dysfunction.  Trace pulmonary regurgitation.  Exercise treadmill stress test 2015: 7.3 METS.  No ischemic changes.  Occasional PAC, atrial bigeminy.   Recent labs: 04/25/2020: BNP 46.3 Glucose 77, BUN 11, creatinine 0.99, GFR 54, sodium 140, potassium 4.1 TSH 1.46, Hemoglobin 14, hematocrit 42.5, MCV 89, platelets 256  11/16/2019: Hemoglobin 13.1, hematocrit 39.2, MCV 80, platelets 231 glucose 131, creatinine 0.93, GFR 58, BUN 18, sodium 138, potassium 3.7 Magnesium 1.8  09/23/2019: Glucose 91, BUN/Cr 19/0.9. EGFR 61. Na/K 140/4.4. Rest of the CMP normal H/H 12.8/40. MCV 90. Platelets 211 Chol 198, TG 67, HDL 78, LDL 108 TSH 1.4 normal    Review of Systems  Constitutional: Negative for weight gain.  Cardiovascular: Positive for dyspnea on exertion (improving) and palpitations (improved to  baseline). Negative for chest pain, claudication, leg swelling, near-syncope, orthopnea, paroxysmal nocturnal dyspnea and syncope.  Hematologic/Lymphatic: Does not bruise/bleed easily.  Gastrointestinal: Negative for melena.  Neurological: Negative for dizziness, loss of balance and weakness.         Vitals:   05/24/20 1011  BP: 134/68  Pulse: 69  Resp: 16  Temp: 98.8 F (37.1 C)  SpO2: 99%     Body mass index is 23.16 kg/m. Filed Weights   05/24/20 1011  Weight: 126 lb 9.6 oz (57.4 kg)    Orthostatic VS for the past 72 hrs (Last 3 readings):  Patient Position BP Location Cuff Size  05/24/20 1011 Sitting Left Arm Normal     Objective:   Physical Exam Vitals and nursing note reviewed.  Constitutional:      General: She is not in acute distress. HENT:     Head: Normocephalic and atraumatic.  Neck:     Vascular: No carotid bruit or JVD.  Cardiovascular:     Rate and Rhythm: Regular rhythm. Bradycardia present.     Pulses: Intact distal pulses.     Heart sounds: S1 normal and S2 normal. Murmur heard.  High-pitched blowing holosystolic murmur is present with a grade of 2/6 at the apex. No gallop.   Pulmonary:     Effort: Pulmonary effort is normal. No respiratory distress.     Breath sounds: Normal breath sounds. No wheezing, rhonchi or rales.  Musculoskeletal:     Right lower leg: No edema.     Left lower leg: No edema.  Skin:    General: Skin is warm and dry.     Capillary Refill: Capillary refill takes less than 2 seconds.  Neurological:     Mental Status: She is alert.         Assessment & Recommendations:     ICD-10-CM   1. Palpitations  R00.2   2. DOE (dyspnea on exertion)  R06.00 PCV MYOCARDIAL PERFUSION WO LEXISCAN    PCV ECHOCARDIOGRAM COMPLETE  3. PVC's (premature ventricular contractions)  I49.3   4. PAC (premature atrial contraction)  I49.1   5. Ascending aortic aneurysm (HCC)  I71.2     80 y.o. Caucasian female with hypertension,  hyperlipidemia, former smoker, and known PACs. Originally referred to our office for evaluation of murmur. She underwent low risk stress testing in 2015 and echocardiogram in 2021 revealing LVEF 17%, diastolic dysfunction, and mild AR and moderate MR. Patient has known first-degree AV block.  1. DOE (dyspnea on exertion)  There is no evidence of heart failure as contributing etiology to patient's dyspnea on exertion.  Also ambulatory cardiac monitor shows no significant cardiac arrhythmia as the allergy. Suspect patient's dyspnea on exertion may be related to deconditioning as it is improving and requires significantly exertion to provoke. However, as patient continues to have symptoms and she has coronary artery calcification noted on recent chest CT, recommend further ischemic evaluation.  We will schedule patient for nuclear stress test.  We will also obtain echocardiogram in view of dyspnea on exertion.  Cannot be ruled out that patient's valvular disease may be contributing to patient's dyspnea, however my suspicion is low that this is a significant component.  2. Snoring Patient does admit to snoring as well as daytime fatigue.  She is presently scheduled to follow-up .for sleep evaluation tomorrow.  Encourage patient to keep this appointment.   3. Hypertension Well controlled in the office today, she does report home readings that fluctuate significantly with readings as high as 159/85 mmHg.  Particularly in view of ascending aortic aneurysm recently noted on chest CT, recommend blood pressure goal <130/80 mmHg.  Discussed with patient recommendation of initiating losartan for improved blood pressure control as well as in view of aortic pathology.  Patient made aware of the benefits of initiating this medication, however she continues to prefer not to start new medications at this time.  We will plan to discuss again at her next office visit.  4. Palpitations Ambulatory cardiac monitoring  revealed symptomatic PACs and PVCs. Recommended medical management of symptoms with beta-blocker therapy.  However patient prefers to avoid additional medications at this time.  She states symptoms are at baseline and manageable.  She will notify our office if she chooses to proceed with initiating beta-blocker therapy for improved symptom control.  5. Nonrheumatic mitral valve regurgitation Repeat echocardiogram.  6. Nonrheumatic aortic valve insufficiency Repeat echocardiogram.  I personally reviewed external records, including chest CT.   Unless there are significant abnormalities on patient's echocardiogram or stress test, follow-up in 3 months for palpitations, hypertension, and aortic aneurysm.   This was a 45-minute encounter with face-to-face counseling, medical records review, coordination of care, explanation of complex medical issues, complex medical decision making.     Alethia Berthold, PA-C 05/24/2020, 2:05 PM Office: [phone]

## 2020-05-24 ENCOUNTER — Ambulatory Visit: Payer: Medicare PPO | Admitting: Student

## 2020-05-24 ENCOUNTER — Other Ambulatory Visit: Payer: Self-pay

## 2020-05-24 ENCOUNTER — Encounter: Payer: Self-pay | Admitting: Student

## 2020-05-24 VITALS — BP 134/68 | HR 69 | Temp 98.8°F | Resp 16 | Ht 62.0 in | Wt 126.6 lb

## 2020-05-24 DIAGNOSIS — R0600 Dyspnea, unspecified: Secondary | ICD-10-CM

## 2020-05-24 DIAGNOSIS — I491 Atrial premature depolarization: Secondary | ICD-10-CM

## 2020-05-24 DIAGNOSIS — I712 Thoracic aortic aneurysm, without rupture: Secondary | ICD-10-CM

## 2020-05-24 DIAGNOSIS — I7121 Aneurysm of the ascending aorta, without rupture: Secondary | ICD-10-CM

## 2020-05-24 DIAGNOSIS — R0609 Other forms of dyspnea: Secondary | ICD-10-CM

## 2020-05-24 DIAGNOSIS — I493 Ventricular premature depolarization: Secondary | ICD-10-CM

## 2020-05-24 DIAGNOSIS — R002 Palpitations: Secondary | ICD-10-CM

## 2020-05-25 ENCOUNTER — Institutional Professional Consult (permissible substitution): Payer: Medicare PPO | Admitting: Neurology

## 2020-06-05 ENCOUNTER — Other Ambulatory Visit: Payer: Self-pay

## 2020-06-05 ENCOUNTER — Ambulatory Visit: Payer: Medicare PPO

## 2020-06-05 DIAGNOSIS — R0609 Other forms of dyspnea: Secondary | ICD-10-CM

## 2020-06-05 DIAGNOSIS — R0600 Dyspnea, unspecified: Secondary | ICD-10-CM

## 2020-06-07 NOTE — Progress Notes (Signed)
Please inform patient stress test was low risk.

## 2020-06-08 ENCOUNTER — Ambulatory Visit: Payer: Medicare PPO

## 2020-06-08 ENCOUNTER — Other Ambulatory Visit: Payer: Self-pay

## 2020-06-08 DIAGNOSIS — R0600 Dyspnea, unspecified: Secondary | ICD-10-CM

## 2020-06-08 DIAGNOSIS — R0609 Other forms of dyspnea: Secondary | ICD-10-CM

## 2020-06-08 NOTE — Progress Notes (Signed)
Attempted to call pt, no answer. Left vm requesting call back.

## 2020-06-08 NOTE — Progress Notes (Signed)
Pt is aware of stress test results.

## 2020-06-21 ENCOUNTER — Encounter: Payer: Self-pay | Admitting: Neurology

## 2020-06-21 ENCOUNTER — Ambulatory Visit: Payer: Medicare PPO | Admitting: Neurology

## 2020-06-21 VITALS — BP 135/75 | HR 56 | Ht 62.0 in | Wt 126.0 lb

## 2020-06-21 DIAGNOSIS — G4752 REM sleep behavior disorder: Secondary | ICD-10-CM | POA: Diagnosis not present

## 2020-06-21 DIAGNOSIS — I491 Atrial premature depolarization: Secondary | ICD-10-CM | POA: Diagnosis not present

## 2020-06-21 DIAGNOSIS — R002 Palpitations: Secondary | ICD-10-CM | POA: Diagnosis not present

## 2020-06-21 DIAGNOSIS — G478 Other sleep disorders: Secondary | ICD-10-CM | POA: Diagnosis not present

## 2020-06-21 DIAGNOSIS — Z8619 Personal history of other infectious and parasitic diseases: Secondary | ICD-10-CM

## 2020-06-21 NOTE — Patient Instructions (Signed)

## 2020-06-21 NOTE — Progress Notes (Signed)
SLEEP MEDICINE CLINIC    Provider:  Larey Seat, MD  Primary Care Physician:  Chesley Noon, [place] [address]     Referring Provider: Dr. Einar Gip Seen by  Dr. Virgina Jock  , Lawerance Cruel, [place]           Chief Complaint according to patient   Patient presents with:    . New Patient (Initial Visit)     Pt alone, rm 10. Presents today for initial sleep eval per cardiology rec. She has never had a SS. States that she has been struggling with irregular heart rhythm and they have completed work up and this is next step.  Avg 7-8 hrs of sleep, broken sleep, wakes up to void. Sometimes has to take melatonin to go back to sleep       HISTORY OF PRESENT ILLNESS:  Hailey Fox is a 80 - year- old Caucasian female patient and seen here on 06/21/2020 from cardiology.  Chief concern according to patient : irregular heart beats, not atrial fibrillation- while she wore a cardiac monitor,  there were several heart stops, she was made aware off by PA".      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 Sleep relevant medical history: Nocturia 2-3 times, at 1.30 AM and at 5 AM.     Family medical /sleep history: No other family member on CPAP with OSA, insomnia, sleep walkers.    Social history:  Patient is retired from [employer] , Dispensing optician. She  lives in a household with spouse, an adult daughter died of multiple myeloma and lymphoma after 2 stem-cell transplants, another daughter is alive and well- 3 grandchildren. Husband is blind. Tobacco use:  Quit 50 plus years ago, started in college- ETOH use ; irregular- 1-2 drinks a month , Caffeine intake in form of Coffee( none anymore- quit 2018 after an abnormal  stress test- PACs) . Regular exercise in form of walking with the dog.  Hobbies : reading.     Sleep habits are as follows: The patient's dinner time is between 5-6 PM. They both watch jeopardy after dinner.   The patient goes to bed at 9 PM and continues to sleep for 4 hours, wakes for several  bathroom breaks, and the 5 o clock break will end her night. The preferred sleep position is left side or supine now-, with the support of 1 pillow. Dreams are reportedly frequent/vivid- lately sometimes with dream enactment. 5-7 AM is the usual rise time. The patient wakes up spontaneously.  She reports not always feeling refreshed or restored in AM, with symptoms such as dry mouth , only rarely morning headaches, and residual fatigue.  Naps are taken infrequently, lasting from 1-2 hours  and are more refreshing than nocturnal sleep.    Review of Systems: Out of a complete 14 system review, the patient complains of only the following symptoms, and all other reviewed systems are negative.:   snoring, enactment of dream,  Nocturia fragmented sleep,  short sleeper, early sleep phase.  Averages 7-9 hours. .    How likely are you to doze in the following situations: 0 = not likely, 1 = slight chance, 2 = moderate chance, 3 = high chance   Sitting and Reading? Watching Television? Sitting  inactive in a public place (theater or meeting)? As a passenger in a car for an hour without a break? Lying down in the afternoon when circumstances permit? Sitting and talking to someone? Sitting quietly after lunch without alcohol? In a car, while stopped for a few minutes in traffic?   Total = 5/ 24 points   FSS endorsed at 29/ 63 points.   Social History   Socioeconomic History  . Marital status: Married    Spouse name: Not on file  . Number of children: 2  . Years of education: Not on file  . Highest education level: Not on file  Occupational History  . Not on file  Tobacco Use  . Smoking status:  Former Smoker    Packs/day: 1.00    Years: 15.00    Pack years: 15.00    Types: Cigarettes    Quit date: 1970    Years since quitting: 52.3  . Smokeless tobacco: Never Used  . Tobacco comment: quit smoking 1970's  Vaping Use  . Vaping Use: Never used  Substance and Sexual Activity  . Alcohol use: Yes    Alcohol/week: 3.0 standard drinks    Types: 3 Glasses of wine per week    Comment: rare  . Drug use: No  . Sexual activity: Not on file  Other Topics Concern  . Not on file  Social History Narrative  . Not on file   Social Determinants of Health   Financial Resource Strain: Not on file  Food Insecurity: Not on file  Transportation Needs: Not on file  Physical Activity: Not on file  Stress: Not on file  Social Connections: Not on file    Family History  Problem Relation Age of Onset  . Cancer Paternal Aunt        breast    Past Medical History:  Diagnosis Date  . Allergy   . Arthritis   . Cancer ([place])    3 skin cancers removed. 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 Cataract    bil  . Complication of anesthesia    slow to wake up  . Dysrhythmia    PAC's   . GERD (gastroesophageal reflux disease)   . Osteoporosis   . PONV (postoperative nausea and vomiting)     Past Surgical History:  Procedure Laterality Date  . APPENDECTOMY  early 48's  . BREAST SURGERY Right 2003   palpiloma  . HERNIA REPAIR    . INGUINAL HERNIA REPAIR Left 08/18/2012   Procedure: LEFT INGUINAL HERNIA REPAIR;  Surgeon: Haywood Lasso, MD;  Location: [facility];  Service: General;  Laterality: Left;  . PARATHYROIDECTOMY  [id number]  . TONSILLECTOMY  1945  . TUBAL LIGATION  early 80's     Current Outpatient Medications on File Prior to Visit  Medication Sig Dispense Refill  . acetaminophen (TYLENOL) 500 MG tablet Take 500 mg by mouth 2 (two) times daily.    . Biotin 1 MG CAPS Take 1 mg by mouth daily.    . Calcium Carbonate-Vit D-Min (CALTRATE 600+D PLUS MINERALS) 600-800 MG-UNIT TABS Take 2  tablets by mouth daily.    . Cetirizine HCl (ZYRTEC ALLERGY PO) Take by mouth.    . Cholecalciferol (VITAMIN D-3 PO) Take 2,000 mg by mouth daily.    . Coenzyme Q10 (COQ10 PO) Take 10 mLs by mouth daily after breakfast.    . denosumab (PROLIA) 60 MG/ML SOSY injection Inject into the skin every 6 (six) months.    Marland Kitchen  KRILL OIL PO Take 1 tablet by mouth daily.    . melatonin 5 MG TABS Take by mouth.    . mometasone (ELOCON) 0.1 % lotion daily as needed (for face).    Marland Kitchen PREMARIN vaginal cream     . rosuvastatin (CRESTOR) 10 MG tablet Take 1 tablet by mouth daily.     No current facility-administered medications on file prior to visit.    Allergies  Allergen Reactions  . Codeine     blackouts  . Contrast Media [Iodinated Diagnostic Agents] Hives  . Macrodantin [Nitrofurantoin Macrocrystal]   . Ciprofloxacin     Other reaction(s): Tachycardia / Palpitations  (intolerance) Rapid pulse  . Iohexol      Desc: IMMEDIATE DEVELOPMENT OF HIVES POST INJECTION   . Tramadol Anxiety and Other (See Comments)    Weakness.    Physical exam:  Today's Vitals   06/21/20 1256  BP: 135/75  Pulse: (!) 56  Weight: 126 lb (57.2 kg)  Height: '5\' 2"'  (1.575 m)   Body mass index is 23.05 kg/m.   Wt Readings from Last 3 Encounters:  06/21/20 126 lb (57.2 kg)  05/24/20 126 lb 9.6 oz (57.4 kg)  04/25/20 126 lb (57.2 kg)     Ht Readings from Last 3 Encounters:  06/21/20 '5\' 2"'  (1.575 m)  05/24/20 '5\' 2"'  (1.575 m)  04/25/20 '5\' 2"'  (1.575 m)      General: The patient is awake, alert and appears not in acute distress. The patient is well groomed. Head: Normocephalic, atraumatic. Neck is supple.  Mallampati 2, pale mucosa postnasal drip.   neck circumference:13 inches . Nasal airflow restricted.  Retrognathia is not seen.  Dental status: biological  Cardiovascular:  Regular rate and cardiac rhythm by pulse,  without distended neck veins. Respiratory:  Skin:  With evidence of mild ankle edema,  bilateral . Trunk: The patient's posture is erect.   Neurologic exam : The patient is awake and alert, oriented to place and time.   Memory subjective described as intact.  Attention span & concentration ability appears normal.  Speech is fluent,  without  dysarthria, dysphonia or aphasia.  Mood and affect are appropriate.   Cranial nerves: no loss of smell or taste reported  Pupils are equal and briskly reactive to light. Funduscopic exam deferred.   Extraocular movements in vertical and horizontal planes were intact and without nystagmus. No Diplopia. Visual fields by finger perimetry are intact. Hearing was intact to soft voice and finger rubbing.    Facial sensation intact to fine touch.  Facial motor strength is symmetric and tongue and uvula move midline.  Neck ROM : rotation, tilt and flexion extension were normal for age and shoulder shrug was symmetrical.    Motor exam:  Symmetric bulk and ROM.  right shoulder is painful.  Elevated biceps tone cog -wheeling over the right, not the left , but symmetric grip strength .   Sensory:  Fine touch, pinprick and vibration were tested  and  normal.  Proprioception tested in the upper extremities was normal.   Coordination: Rapid alternating movements in the fingers/hands were of normal speed.  The Finger-to-nose maneuver was intact without evidence of ataxia, dysmetria or tremor.   Gait and station: deferred.  Deep tendon reflexes: in the upper and lower extremities are symmetric and intact.  Babinski response was deferred .      After spending a total time of 45 minutes face to face and additional time for physical and neurologic examination,  review of laboratory studies,  personal review of imaging studies, reports and results of other testing and review of referral information / records as far as provided in visit, I have established the following assessments:  1) PACs are frequent and she has been reporting less quality of  sleep. Non restorative.   2) Nocturia cuts sleep short ( less than 6 hours on some nights) and vivid dreams may wake her early, too.    3) had a L 1-2 fracture , non-surgical repair.   My Plan is to proceed with:  1) I would much prefer to obtain an attended sleep study as this patient is specifically referred for cardiac dysrhythmia or premature atrial contractions.  A home sleep test would not allow me to correlate a heart rhythm to any apneic events, it will also not allow me to look at REM sleep specific changes in muscle tone, behavior or vocalizations.  However this will be depending on insurance coverage so I will go ahead and order both a parasomnia montage attended sleep study with specific attention to the cardiac rhythm as well as a home sleep test.    I would like to thank Dr. Virgina Jock  and Lawerance Cruel , [place] for allowing me to meet with and to take care of this pleasant patient.   In short, Hailey Fox is presenting with less restorative sleep and cardiac dysrhythmia, atrial..   I plan to follow up either personally or through our NP within 2-4 month.   CC: I will share my notes with PCP.   Electronically signed by: Larey Seat, MD 06/21/2020 1:16 PM  [facility] [place] certified by The [employer] of Sleep Medicine and Diplomate of the [employer] of Sleep Medicine. Board certified In Neurology through the [place], Fellow of the [employer] of Neurology. Medical Director of [employer].

## 2020-06-22 ENCOUNTER — Encounter: Payer: Self-pay | Admitting: Neurology

## 2020-06-23 NOTE — Telephone Encounter (Signed)
I will forward this to our schedulers and see if we can get it back ! This is his appointment? Your appointment? With whom in the office?  I nee to know to send it to the right person.   Sorry for the circumferential path this may take.     Arta Bruce

## 2020-07-12 ENCOUNTER — Other Ambulatory Visit: Payer: Self-pay

## 2020-07-12 ENCOUNTER — Ambulatory Visit (INDEPENDENT_AMBULATORY_CARE_PROVIDER_SITE_OTHER): Payer: Medicare PPO | Admitting: Neurology

## 2020-07-12 DIAGNOSIS — G4733 Obstructive sleep apnea (adult) (pediatric): Secondary | ICD-10-CM | POA: Diagnosis not present

## 2020-07-12 DIAGNOSIS — G478 Other sleep disorders: Secondary | ICD-10-CM

## 2020-07-12 DIAGNOSIS — G4752 REM sleep behavior disorder: Secondary | ICD-10-CM

## 2020-07-12 DIAGNOSIS — R002 Palpitations: Secondary | ICD-10-CM

## 2020-07-12 DIAGNOSIS — I491 Atrial premature depolarization: Secondary | ICD-10-CM

## 2020-07-20 NOTE — Progress Notes (Signed)
   [facility] [place] (Watch PAT)  STUDY DATE: 07/12/20 DATA LOAD: 07-20-2020  DOB: [birth year]  MRN: [id number]  ORDERING CLINICIAN: Larey Seat, MD   REFERRING CLINICIAN: Dr. Einar Gip Dr. 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 Sleeprelevant medical history: Nocturia 2-3 times, at 1.30 AM and at 5 AM.   Epworth sleepiness score: 5/24. FSS at 29/63 points.   BMI: 23.1 kg/m  Neck Circumference: 13"   FINDINGS:   Total Record Time (hours, min): 9 h 36 min  Total Sleep Time (hours, min):  8 h 8 min   Percent REM (%):    26.40%   Calculated pAHI (per hour): 17.1      REM pAHI: 24.2   NREM pAHI: 14.5 Supine AHI:    Oxygen Saturation (%) Mean: 94  Minimum oxygen saturation (%):         84   O2 Saturation Range (%): 84-98  O2Saturation (minutes) <=88%: 1.2 min  Pulse Mean (bpm):    55  Pulse Range (46-89)   IMPRESSION: This HST confirms the presence of moderate OSA (obstructive sleep apnea) , REM sleep accentuated but not REM dependent. 2 arousal periods were noted. The patient had over 8 hours of sleep and a normal REM sleep distribution. The snoring electrode was re-attached at 00.15 hours.   RECOMMENDATION: The patient could chose between CPAP, a dental device and inspire device.  I would favor to try the least costly and less invasive treatment in form of CPAP autotitration therapy with pressures of 5-15 cm water, 2 cm EPR and mask of her choice and comfort.  Plan B would be a dental device.     INTERPRETING PHYSICIAN:  Larey Seat, MD    [facility] Sleep Board certified by The [employer] of Sleep Medicine and  Diplomate of the [employer] of Sleep Medicine. Board certified In Neurology through the [place], Fellow of the [employer] of Neurology. Medical Director of [employer].  Sleep Summary  Oxygen Saturation Statistics   Start Study Time: End Study Time: Total Recording Time:  9:31:49 PM 7:08:31 AM 9 h, 36 min  Total Sleep Time % REM of Sleep Time:  8 h, 8 min  26.4    Mean: 94 Minimum: 84 Maximum: 98  Mean of Desaturations Nadirs (%):   91  Oxygen Desatur. %:  4-9 10-20 >20 Total  Events Number Total   64  5 92.8 7.2  0 0.0  69 100.0  Oxygen Saturation: <90 <=88 <85 <80 <70  Duration (minutes): Sleep % 2.0 0.4 1.2 0.1 0.3 0.0 0.0 0.0 0.0 0.0     Respiratory Indices      Total Events REM NREM All Night  pRDI: pAHI 3%:  135  135 24.2 24.2 14.5 14.5 17.1 17.1  ODI 4%: pAHI 4%:  69 65 18.5 5.2 8.7 8.2       Pulse Rate Statistics during Sleep (BPM)      Mean: 55 Minimum: 46 Maximum: 89

## 2020-07-27 NOTE — Progress Notes (Signed)
IMPRESSION: This HST confirms the presence of moderate OSA (obstructive sleep apnea) , REM sleep accentuated but not REM dependent. 2 arousal periods were noted. The patient had over 8 hours of sleep and a normal REM sleep distribution. The snoring electrode was re-attached at 00.15 hours.   RECOMMENDATION: The patient could chose between CPAP, a dental device and inspire device.  I would favor to try the least costly and less invasive treatment in form of CPAP autotitration therapy with pressures of 5-15 cm water, 2 cm EPR and mask of her choice and comfort.  Plan B would be a dental device.

## 2020-07-27 NOTE — Procedures (Signed)
[facility] [place] (Watch PAT)  STUDY DATE: 07/12/20 DATA LOAD: 07-20-2020  DOB: 1941/02/18  MRN: [id number]  ORDERING CLINICIAN: Larey Seat, MD   REFERRING CLINICIAN: Dr. Einar Gip Dr. 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 Sleeprelevant medical history: Nocturia 2-3 times, at 1.30 AM and at 5 AM.   Epworth sleepiness score: 5/24. FSS at 29/63 points.   BMI: 23.1 kg/m  Neck Circumference: 13"   FINDINGS:   Total Record Time (hours, min): 9 h 36 min  Total Sleep Time (hours, min):  8 h 8 min   Percent REM (%):    26.40%   Calculated pAHI (per hour): 17.1      REM pAHI: 24.2   NREM pAHI: 14.5 Supine AHI:    Oxygen Saturation (%) Mean: 94  Minimum oxygen saturation (%):         84   O2 Saturation Range (%): 84-98  O2Saturation (minutes) <=88%: 1.2 min  Pulse Mean (bpm):    55  Pulse Range (46-89)   IMPRESSION: This HST confirms the presence of moderate OSA (obstructive sleep apnea) , REM sleep accentuated but not REM dependent. 2 arousal periods were noted. The patient had over 8 hours of sleep and a normal REM sleep distribution. The snoring electrode was re-attached at 00.15 hours.   RECOMMENDATION: The patient could chose between CPAP, a dental device and inspire device.  I would favor to try the least costly and less invasive treatment in form of CPAP autotitration therapy with pressures of 5-15 cm water, 2 cm EPR and mask of her choice and comfort.  Plan B would be a dental device.     INTERPRETING PHYSICIAN:  Larey Seat, MD    [facility] Sleep Board certified by The [employer] of Sleep Medicine and Diplomate  of the [employer] of Sleep Medicine. Board certified In Neurology through the [place], Fellow of the [employer] of Neurology. Medical Director of [employer].  Sleep Summary  Oxygen Saturation Statistics   Start Study Time: End Study Time: Total Recording Time:  9:31:49 PM 7:08:31 AM 9 h, 36 min  Total Sleep Time % REM of Sleep Time:  8 h, 8 min  26.4    Mean: 94 Minimum: 84 Maximum: 98  Mean of Desaturations Nadirs (%):   91  Oxygen Desatur. %:  4-9 10-20 >20 Total  Events Number Total   64  5 92.8 7.2  0 0.0  69 100.0  Oxygen Saturation: <90 <=88 <85 <80 <70  Duration (minutes): Sleep % 2.0 0.4 1.2 0.1 0.3 0.0 0.0 0.0 0.0 0.0     Respiratory Indices      Total Events REM NREM All Night  pRDI: pAHI 3%:  135  135 24.2 24.2 14.5 14.5 17.1 17.1  ODI 4%: pAHI 4%:  69 65 18.5 5.2 8.7 8.2       Pulse Rate Statistics during Sleep (BPM)      Mean: 55 Minimum: 46 Maximum: 89

## 2020-07-27 NOTE — Addendum Note (Signed)
Addended by: Larey Seat on: 07/27/2020 12:19 PM   Modules accepted: Orders

## 2020-07-28 ENCOUNTER — Encounter: Payer: Self-pay | Admitting: Neurology

## 2020-08-01 NOTE — Telephone Encounter (Signed)
-----   Message from Larey Seat, MD sent at 07/27/2020 12:19 PM EDT ----- IMPRESSION: This HST confirms the presence of moderate OSA (obstructive sleep apnea) , REM sleep accentuated but not REM dependent. 2 arousal periods were noted. The patient had over 8 hours of sleep and a normal REM sleep distribution. The snoring electrode was re-attached at 00.15 hours.   RECOMMENDATION: The patient could chose between CPAP, a dental device and inspire device.  I would favor to try the least costly and less invasive treatment in form of CPAP autotitration therapy with pressures of 5-15 cm water, 2 cm EPR and mask of her choice and comfort.  Plan B would be a dental device.

## 2020-08-01 NOTE — Telephone Encounter (Signed)
Called the patient to review the sleep study results in detail, patient answered but was unable to discuss at this time due to a prior appointment that is scheduled.  Advised the patient that I have sent a my chart message if she would like to review that.  Also stated she can give me a call back when she is ready to discuss.  Patient verbalized understanding and was appreciative for the call.

## 2020-08-23 NOTE — Progress Notes (Signed)
Patient referred by Chesley Noon, MD for murmur  Subjective:   Hailey Fox, female    DOB: 12-24-1940, 80 y.o.   MRN: [id number]   Chief Complaint  Patient presents with   Palpitations   AAA   Hypertension     HPI  80 y.o. Caucasian female with hypertension, hyperlipidemia, former smoker, and known PACs. Originally referred to our office for evaluation of murmur. She underwent low risk stress testing in 2015 and echocardiogram in 2021 revealing LVEF 70%, diastolic dysfunction, and mild AR and moderate MR. Patient has known first-degree AV block.  Patient reports she is enrolled in [facility] clinical study at [facility], although it is unclear what the study is for. Notably patient was diagnosed with [facility] spotted fever 11/16/2019 and treated.  Patient presents for 57-month follow-up of palpitations, hypertension, and aortic aneurysm.  Since last visit patient has been evaluated by Dr. Brett Fairy and diagnosed with moderate sleep apnea, she is currently working with Dr. Edwena Felty office to set up treatment either with CPAP or dental prosthesis.  At last visit recommended initiation of losartan for hypertension in view of aortic aneurysm as well as beta-blocker therapy in view of symptomatic PACs/PVCs, however patient continues to wish to refrain from additional medications.  Patient is feeling the best she has in quite a while.  She is currently walking 1.5 to 3 miles per day without issue and is doing yard work and enjoying this.  She states she is sleeping better than she has in months.  She has had no recurrence of palpitations since last visit.  Denies chest pain, dyspnea, syncope, near syncope, dizziness, leg swelling, orthopnea, PND.  Notably patient underwent recent CT of the chest which found 4 cm ascending aortic aneurysm for which she was referred to thoracic surgeon.  Cardiothoracic surgery has recommended repeat CTA in 1 year and patient will continue to follow  with them.  CT also noted biatrial enlargement and coronary artery calcifications.  Past Medical History:  Diagnosis Date   Allergy    Arthritis    Cancer ([place])    3 skin cancers removed. 2 on legs, 1 from face   Cataract    bil   Complication of anesthesia    slow to wake up   Dysrhythmia    PAC's    GERD (gastroesophageal reflux disease)    Osteoporosis    PONV (postoperative nausea and vomiting)      Past Surgical History:  Procedure Laterality Date   APPENDECTOMY  early 80's   BREAST SURGERY Right 2003   [place]     INGUINAL HERNIA REPAIR Left 08/18/2012   Procedure: LEFT INGUINAL HERNIA REPAIR;  Surgeon: Haywood Lasso, MD;  Location: [place];  Service: General;  Laterality: Left;   [id number]  [id number]   [place]  early 52's     Social History   Tobacco Use  Smoking Status Former   Packs/day: 1.00   Years: 15.00   Pack years: 15.00   Types: Cigarettes   Quit date: 1970   Years since quitting: 52.5  Smokeless Tobacco Never  Tobacco Comments   quit smoking 1970's    Social History   Substance and Sexual Activity  Alcohol Use Yes   Alcohol/week: 3.0 standard drinks   Types: 3 Glasses of wine per week   Comment: rare     Family History  Problem Relation Age of Onset  Cancer Paternal Aunt        breast     Current Outpatient Medications on File Prior to Visit  Medication Sig Dispense Refill   acetaminophen (TYLENOL) 500 MG tablet Take 500 mg by mouth 2 (two) times daily.     Biotin 1 MG CAPS Take 1 mg by mouth daily.     Calcium Carbonate-Vit D-Min (CALTRATE 600+D PLUS MINERALS) 600-800 MG-UNIT TABS Take 2 tablets by mouth daily.     Cetirizine HCl (ZYRTEC ALLERGY PO) Take by mouth.     Coenzyme Q10 (COQ10 PO) Take 10 mLs by mouth daily after breakfast.     denosumab (PROLIA) 60 MG/ML SOSY injection Inject into the skin every 6 (six) months.     KRILL OIL PO Take 1 tablet by mouth daily.      melatonin 5 MG TABS Take by mouth.     mometasone (ELOCON) 0.1 % lotion daily as needed (for face).     PREMARIN vaginal cream      rosuvastatin (CRESTOR) 10 MG tablet Take 1 tablet by mouth daily.     Cholecalciferol (VITAMIN D-3 PO) Take 2,000 mg by mouth daily.     No current facility-administered medications on file prior to visit.    Cardiovascular and other pertinent studies: PCV ECHOCARDIOGRAM COMPLETE 06/08/2020 Left ventricle cavity is normal in size and wall thickness. Normal global wall motion. Normal LV systolic function with EF 66%. Doppler evidence of grade I (impaired) diastolic dysfunction, normal LAP. Structurally normal trileaflet aortic valve. Mild (Grade I) aortic regurgitation. Mild (Grade I) mitral regurgitation. Mild tricuspid regurgitation. Estimated pulmonary artery systolic pressure 29 mmHg. No significant change compared to previous study on 10/25/2019.  PCV MYOCARDIAL PERFUSION WO LEXISCAN 06/05/2020 Lexiscan nuclear stress test performed using 1-day protocol. Stress EKG is non-diagnostic for ischemia as it's a pharmacologic stress test using Lexiscan. Normal myocardial perfusion. Mild decrease in tracer uptake in apical septal myocardium both at rest and stress likely due to tissue attenuation.  STress LVEF 69%. Low risk study.  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 Small left renal cysts  Severe remote L1 compression fracture   Ambulatory cardiac telemetry 04/12/2020-04/26/2020: Notably patient wore 2 separate monitors over the course of 14 days, for a total analysis time of 12 days 22 hours.   Predominant underlying rhythm was sinus with first-degree AV block.  Minimum heart rate of 21 bpm, maximum heart rate of 74 bpm, average heart rate of 60-67 bpm.  Patient with 74 episodes of  supraventricular tachycardia, including episodes suggestive rather of sinus tachycardia and some episodes rather suggestive of atrial tachycardia, with the fastest lasting 5 beats, maximum rate 174 bpm and longest lasting 6 minutes.  Patient with 1 sinus pause was >3 seconds, lasting 4 seconds occurring at night and asymptomatic.  Intermittent second-degree AV block Mobitz type II present, patient asymptomatic.  Isolated PACs were occasional, with PAC couplets/triplets as well as isolated PVCs and PVC couplets were all rare.  Ventricular bigeminy and trigeminy were present. No ventricular tachycardia, atrial fibrillation.  Patient symptoms correlated with sinus rhythm, PACs and PVCs.  EKG 04/25/2020: Sinus rhythm with first-degree AV block at a rate of 62 bpm.  Left atrial enlargement.  Normal axis. No underlying ischemia or injury pattern. Compared to EKG 04/12/2020, no significant change.  PCV ECHOCARDIOGRAM COMPLETE 10/25/2019 Left ventricle cavity is normal in size. Mild concentric hypertrophy of the left ventricle. Normal global wall motion. Normal LV systolic function with EF 55%. Doppler evidence of grade I (impaired) diastolic dysfunction, normal LAP. Left atrial cavity is mildly dilated. Trileaflet aortic valve.  Mild (Grade I) aortic regurgitation. Moderate (Grade II) mitral regurgitation. Mild to moderate tricuspid regurgitation. Estimated pulmonary artery systolic pressure 28 mmHg. Compared to previous study in 2015, mild increase in severity of mitral and tricuspid regurgitation.  AAA screening 10/2019: Proximal aorta: 23 x 27 mm.  Peak systolic velocity is 62 cm/s.  Waveform is Normal.  Mid aorta: 18 x 19 mm.  Distal aorta: 18 x 21 mm.  Common iliac arteries:  11 mm.  Inferior vena cava:  Normal where visible  Additional findings:  None  Echocardiogram 2015: EF 50-55%.  Grade 1 diastolic dysfunction.  Trace pulmonary regurgitation.  Exercise treadmill stress test 2015: 7.3 METS.  No  ischemic changes.  Occasional PAC, atrial bigeminy.   Recent labs: 04/25/2020: BNP 46.3 Glucose 77, BUN 11, creatinine 0.99, GFR 54, sodium 140, potassium 4.1 TSH 1.46, Hemoglobin 14, hematocrit 42.5, MCV 89, platelets 256  11/16/2019: Hemoglobin 13.1, hematocrit 39.2, MCV 80, platelets 231 glucose 131, creatinine 0.93, GFR 58, BUN 18, sodium 138, potassium 3.7 Magnesium 1.8  09/23/2019: Glucose 91, BUN/Cr 19/0.9. EGFR 61. Na/K 140/4.4. Rest of the CMP normal H/H 12.8/40. MCV 90. Platelets 211 Chol 198, TG 67, HDL 78, LDL 108 TSH 1.4 normal    Review of Systems  Cardiovascular:  Negative for chest pain, dyspnea on exertion (improved), leg swelling, orthopnea, palpitations (no recurrence) and syncope.  Neurological:  Negative for dizziness.        Vitals:   08/24/20 0952  BP: 135/69  Pulse: 63  Temp: 98.3 F (36.8 C)  SpO2: 99%     Body mass index is 23.41 kg/m. Filed Weights   08/24/20 0952  Weight: 128 lb (58.1 kg)    Orthostatic VS for the past 72 hrs (Last 3 readings):  Patient Position BP Location Cuff Size  08/24/20 0952 Sitting Left Arm Normal     Objective:   Physical Exam Vitals reviewed.  HENT:     Head: Normocephalic and atraumatic.  Neck:     Vascular: No carotid bruit.  Cardiovascular:     Rate and Rhythm: Normal rate and regular rhythm.     Pulses: Intact distal pulses.     Heart sounds: S1 normal and S2 normal. No murmur heard.   No gallop.  Pulmonary:     Effort: Pulmonary effort is normal. No respiratory distress.     Breath sounds: No wheezing, rhonchi or rales.  Musculoskeletal:     Right lower leg: No edema.     Left lower leg: No edema.  Neurological:     Mental Status: She is alert.        Assessment & Recommendations:     ICD-10-CM   1. Palpitations  R00.2     2. Nonrheumatic mitral valve regurgitation  I34.0     3. Nonrheumatic aortic valve insufficiency  I35.1     4. Ascending aortic aneurysm (HCC)  I71.2  CANCELED: CT CHEST WO CONTRAST      80 y.o. Caucasian female with hypertension, hyperlipidemia, former smoker, and known PACs. Originally referred to our office for evaluation of murmur. She underwent low risk stress testing in 2015 and echocardiogram in 2021 revealing LVEF 14%, diastolic dysfunction, and mild AR and moderate  MR. Patient has known first-degree AV block.  Patient presents for 55-month follow-up of palpitations, hypertension, and aortic aneurysm.  Since last visit patient has been evaluated by Dr. Brett Fairy and diagnosed with moderate sleep apnea, she is currently working with Dr. Edwena Felty office to set up treatment either with CPAP or dental prosthesis.  At last visit recommended initiation of losartan for hypertension in view of aortic aneurysm as well as beta-blocker therapy in view of symptomatic PACs/PVCs, however patient continues to wish to refrain from additional medications.  1. DOE (dyspnea on exertion) Patient's dyspnea on exertion has significantly improved since she has been increasing her physical activity.  Reviewed and discussed with patient results of echocardiogram, details above.  Echo revealed normal LVEF and mild diastolic dysfunction, otherwise was unremarkable.  2. Snoring Patient has been diagnosed with moderate sleep apnea, for which she is following with Dr. Brett Fairy.  Discussed at length with patient regarding the importance of treating sleep apnea to reduce risk of future cardiovascular complications.  She verbalized understanding and agrees to attempt to tolerate CPAP or dental prosthesis.  3. Hypertension Well-controlled in the office today.  She also states home blood pressure readings have only remained <130/80 mmHg. Again discussed with patient recommendation of initiating losartan for improved blood pressure control as well as in view of aortic pathology.  Patient made aware of the benefits of initiating this medication, however she continues to prefer  not to start new medications at this time.    4. Palpitations No recurrence of palpitations since last visit. Ambulatory cardiac monitoring revealed symptomatic PACs and PVCs. Could consider initiation of beta-blocker therapy in the future if necessary to control symptoms.  However at this time we will off.  5. Nonrheumatic mitral valve regurgitation Stable per echocardiogram.  6. Nonrheumatic aortic valve insufficiency Stable per echocardiogram.  7.  Ascending aortic aneurysm 4.0 cm: Will defer further management to cardiothoracic surgery.  Patient plans to have repeat CTA done in approximately 1 year and will follow-up with CT surgery after.    Alethia Berthold, PA-C 08/24/2020, 10:31 AM Office: [phone]

## 2020-08-24 ENCOUNTER — Encounter: Payer: Self-pay | Admitting: Student

## 2020-08-24 ENCOUNTER — Ambulatory Visit: Payer: Medicare PPO | Admitting: Student

## 2020-08-24 ENCOUNTER — Other Ambulatory Visit: Payer: Self-pay

## 2020-08-24 VITALS — BP 135/69 | HR 63 | Temp 98.3°F | Ht 62.0 in | Wt 128.0 lb

## 2020-08-24 DIAGNOSIS — I351 Nonrheumatic aortic (valve) insufficiency: Secondary | ICD-10-CM

## 2020-08-24 DIAGNOSIS — I7121 Aneurysm of the ascending aorta, without rupture: Secondary | ICD-10-CM

## 2020-08-24 DIAGNOSIS — I340 Nonrheumatic mitral (valve) insufficiency: Secondary | ICD-10-CM

## 2020-08-24 DIAGNOSIS — R002 Palpitations: Secondary | ICD-10-CM

## 2020-08-30 ENCOUNTER — Encounter: Payer: Self-pay | Admitting: Neurology

## 2020-10-05 ENCOUNTER — Telehealth: Payer: Self-pay | Admitting: Student

## 2020-10-06 NOTE — Telephone Encounter (Signed)
Patient was advised to be evaluated in ED, however she preferred not to therefore she was scheduled for first available office visit and given strict precautions.

## 2020-10-09 ENCOUNTER — Ambulatory Visit: Payer: Medicare PPO | Admitting: Student

## 2020-10-09 ENCOUNTER — Encounter: Payer: Self-pay | Admitting: Student

## 2020-10-09 ENCOUNTER — Other Ambulatory Visit: Payer: Self-pay

## 2020-10-09 VITALS — BP 146/73 | HR 58 | Temp 98.0°F | Resp 17 | Ht 62.0 in | Wt 126.0 lb

## 2020-10-09 DIAGNOSIS — R002 Palpitations: Secondary | ICD-10-CM

## 2020-10-09 DIAGNOSIS — I491 Atrial premature depolarization: Secondary | ICD-10-CM

## 2020-10-09 DIAGNOSIS — J310 Chronic rhinitis: Secondary | ICD-10-CM

## 2020-10-09 DIAGNOSIS — I493 Ventricular premature depolarization: Secondary | ICD-10-CM

## 2020-10-09 DIAGNOSIS — R5383 Other fatigue: Secondary | ICD-10-CM

## 2020-10-09 NOTE — Progress Notes (Signed)
Patient referred by Chesley Noon, MD for murmur  Subjective:   Hailey Fox, female    DOB: June 15, 1940, 80 y.o.   MRN: [id number]   Chief Complaint  Patient presents with   Shortness of Breath     HPI  80 y.o. Caucasian female with hypertension, hyperlipidemia, former smoker, and known PACs. Originally referred to our office for evaluation of murmur. She underwent low risk stress testing in 2015 and echocardiogram in 2021 revealing LVEF 78%, diastolic dysfunction, and mild AR and moderate MR. Patient has known first-degree AV block.  Patient reports she is enrolled in ARIC clinical study at [facility], although it is unclear what the study is for. Notably patient was diagnosed with [facility] spotted fever 11/16/2019 and treated.  Patient presents for urgent visit at her request due to increased fatigue and rhinitis over the last 2 weeks.  She also continues to have occasional palpitations, which are chronic and stable.  Patient states that over the last 2 weeks her allergies have been increasingly bothering her, she has taken 2 COVID-19 test both of which were negative.  She is currently scheduled for CPAP titration upcoming, as well as has an appointment with PCP. Denies chest pain, dyspnea, syncope, near syncope, dizziness, leg swelling, orthopnea, PND.  Notably in the past have discussed the initiation of losartan or beta blocker therapy given aortic aneurysm and symptomatic PACs/PVCs respectively, however patient has wish to refrain from additional medications.  Patient continues to exercise on a daily basis without issue.  Past Medical History:  Diagnosis Date   Allergy    Arthritis    Cancer ([place])    3 skin cancers removed. 2 on legs, 1 from face   Cataract    bil   Complication of anesthesia    slow to wake up   Dysrhythmia    PAC's    GERD (gastroesophageal reflux disease)    Osteoporosis    PONV (postoperative nausea and vomiting)    Past Surgical  History:  Procedure Laterality Date   APPENDECTOMY  early 80's   BREAST SURGERY Right 2003   palpiloma   HERNIA REPAIR     INGUINAL HERNIA REPAIR Left 08/18/2012   Procedure: LEFT INGUINAL HERNIA REPAIR;  Surgeon: Haywood Lasso, MD;  Location: [place];  Service: General;  Laterality: Left;   [id number]  [id number]   [place]  early 26's   Social History   Tobacco Use  Smoking Status Former   Packs/day: 1.00   Years: 15.00   Pack years: 15.00   Types: Cigarettes   Quit date: 1970   Years since quitting: 52.6  Smokeless Tobacco Never  Tobacco Comments   quit smoking 1970's   Social History   Substance and Sexual Activity  Alcohol Use Yes   Alcohol/week: 3.0 standard drinks   Types: 3 Glasses of wine per week   Comment: rare   Family History  Problem Relation Age of Onset   Heart disease Mother    Dementia Mother    Cancer Paternal Aunt        breast   Kidney failure Maternal Grandmother    Heart attack Maternal Grandfather    Current Outpatient Medications on File Prior to Visit  Medication Sig Dispense Refill   acetaminophen (TYLENOL) 500 MG tablet Take 500 mg by mouth 2 (two) times daily.     Biotin 1 MG CAPS Take 1 mg by mouth daily.  Calcium Carbonate-Vit D-Min (CALTRATE 600+D PLUS MINERALS) 600-800 MG-UNIT TABS Take 2 tablets by mouth daily.     Cetirizine HCl (ZYRTEC ALLERGY PO) Take 1 tablet by mouth as needed.     Cholecalciferol (VITAMIN D-3 PO) Take 2,000 mg by mouth daily.     Coenzyme Q10 (COQ10 PO) Take 10 mLs by mouth daily after breakfast.     denosumab (PROLIA) 60 MG/ML SOSY injection Inject into the skin every 6 (six) months.     KRILL OIL PO Take 1 tablet by mouth daily.     melatonin 5 MG TABS Take by mouth.     mometasone (ELOCON) 0.1 % lotion daily as needed (for face).     PREMARIN vaginal cream      rosuvastatin (CRESTOR) 10 MG tablet Take 1 tablet by mouth daily.     No current facility-administered  medications on file prior to visit.    Cardiovascular and other pertinent studies: EKG 10/09/2020: Sinus rhythm at a rate of 62 bpm with first-degree AV block.  Normal axis.  PCV ECHOCARDIOGRAM COMPLETE 06/08/2020 Left ventricle cavity is normal in size and wall thickness. Normal global wall motion. Normal LV systolic function with EF 66%. Doppler evidence of grade I (impaired) diastolic dysfunction, normal LAP. Structurally normal trileaflet aortic valve. Mild (Grade I) aortic regurgitation. Mild (Grade I) mitral regurgitation. Mild tricuspid regurgitation. Estimated pulmonary artery systolic pressure 29 mmHg. No significant change compared to previous study on 10/25/2019.  PCV MYOCARDIAL PERFUSION WO LEXISCAN 06/05/2020 Lexiscan nuclear stress test performed using 1-day protocol. Stress EKG is non-diagnostic for ischemia as it's a pharmacologic stress test using Lexiscan. Normal myocardial perfusion. Mild decrease in tracer uptake in apical septal myocardium both at rest and stress likely due to tissue attenuation.  STress LVEF 69%. Low risk study.  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 Small left renal cysts  Severe remote L1 compression fracture   Ambulatory cardiac telemetry 04/12/2020-04/26/2020: Notably patient wore 2 separate monitors over the course of 14 days, for a total analysis time of 12 days 22 hours.   Predominant underlying rhythm was sinus with first-degree AV block.  Minimum heart rate of 21 bpm, maximum heart rate of 74 bpm, average heart rate of 60-67 bpm.  Patient with 74 episodes of supraventricular tachycardia, including episodes suggestive rather of sinus tachycardia and some episodes rather suggestive of atrial tachycardia, with the fastest lasting 5 beats, maximum rate 174 bpm and longest  lasting 6 minutes.  Patient with 1 sinus pause was >3 seconds, lasting 4 seconds occurring at night and asymptomatic.  Intermittent second-degree AV block Mobitz type II present, patient asymptomatic.  Isolated PACs were occasional, with PAC couplets/triplets as well as isolated PVCs and PVC couplets were all rare.  Ventricular bigeminy and trigeminy were present. No ventricular tachycardia, atrial fibrillation.  Patient symptoms correlated with sinus rhythm, PACs and PVCs.  EKG 04/25/2020: Sinus rhythm with first-degree AV block at a rate of 62 bpm.  Left atrial enlargement.  Normal axis. No underlying ischemia or injury pattern. Compared to EKG 04/12/2020, no significant change.  PCV ECHOCARDIOGRAM COMPLETE 10/25/2019 Left ventricle cavity is normal in size. Mild concentric hypertrophy of the left ventricle. Normal global wall motion. Normal LV systolic function with EF 55%. Doppler evidence of grade I (impaired) diastolic dysfunction, normal LAP. Left  atrial cavity is mildly dilated. Trileaflet aortic valve.  Mild (Grade I) aortic regurgitation. Moderate (Grade II) mitral regurgitation. Mild to moderate tricuspid regurgitation. Estimated pulmonary artery systolic pressure 28 mmHg. Compared to previous study in 2015, mild increase in severity of mitral and tricuspid regurgitation.  AAA screening 10/2019: Proximal aorta: 23 x 27 mm.  Peak systolic velocity is 62 cm/s.  Waveform is Normal.  Mid aorta: 18 x 19 mm.  Distal aorta: 18 x 21 mm.  Common iliac arteries:  11 mm.  Inferior vena cava:  Normal where visible  Additional findings:  None  Echocardiogram 2015: EF 50-55%.  Grade 1 diastolic dysfunction.  Trace pulmonary regurgitation.  Exercise treadmill stress test 2015: 7.3 METS.  No ischemic changes.  Occasional PAC, atrial bigeminy.   Recent labs: 04/25/2020: BNP 46.3 Glucose 77, BUN 11, creatinine 0.99, GFR 54, sodium 140, potassium 4.1 TSH 1.46, Hemoglobin 14, hematocrit 42.5, MCV  89, platelets 256  11/16/2019: Hemoglobin 13.1, hematocrit 39.2, MCV 80, platelets 231 glucose 131, creatinine 0.93, GFR 58, BUN 18, sodium 138, potassium 3.7 Magnesium 1.8  09/23/2019: Glucose 91, BUN/Cr 19/0.9. EGFR 61. Na/K 140/4.4. Rest of the CMP normal H/H 12.8/40. MCV 90. Platelets 211 Chol 198, TG 67, HDL 78, LDL 108 TSH 1.4 normal    Review of Systems  Constitutional: Positive for malaise/fatigue.  HENT:  Positive for congestion.   Cardiovascular:  Negative for chest pain, dyspnea on exertion (improved), leg swelling, orthopnea, palpitations (no recurrence) and syncope.  Respiratory:  Positive for cough.   Neurological:  Negative for dizziness.        Vitals:   10/09/20 1449 10/09/20 1501  BP: (!) 144/83 (!) 146/73  Pulse: (!) 52 (!) 58  Resp: 17   Temp: 98 F (36.7 C)   SpO2: 98% 98%     Body mass index is 23.05 kg/m. Filed Weights   10/09/20 1449  Weight: 126 lb (57.2 kg)    Orthostatic VS for the past 72 hrs (Last 3 readings):  Patient Position BP Location Cuff Size  10/09/20 1501 Sitting Left Arm Normal  10/09/20 1449 Sitting Left Arm Normal     Objective:   Physical Exam Vitals reviewed.  Constitutional:      Appearance: Normal appearance.  Neck:     Vascular: No carotid bruit.  Cardiovascular:     Rate and Rhythm: Normal rate and regular rhythm.     Pulses: Intact distal pulses.     Heart sounds: S1 normal and S2 normal. No murmur heard.   No gallop.  Pulmonary:     Effort: Pulmonary effort is normal. No respiratory distress.     Breath sounds: No wheezing, rhonchi or rales.  Musculoskeletal:     Right lower leg: No edema.     Left lower leg: No edema.  Skin:    General: Skin is warm and dry.  Neurological:     Mental Status: She is alert.        Assessment & Recommendations:     ICD-10-CM   1. Palpitations  R00.2 EKG 12-Lead    2. Other fatigue  R53.83     3. PAC (premature atrial contraction)  I49.1     4. PVC's  (premature ventricular contractions)  I49.3     5. Other rhinitis  J31.0       81 y.o. Caucasian female with hypertension, hyperlipidemia, former smoker, and known PACs. Originally referred to our office for evaluation of murmur. She underwent low risk stress testing in  2015 and echocardiogram in 2021 revealing LVEF 20%, diastolic dysfunction, and mild AR and moderate MR. Patient has known first-degree AV block.  1.  Fatigue Suspect fatigue is likely multifactorial including in adequate sleep due to worsening allergies over the last 2 weeks as well as untreated sleep apnea. Patient is in the process of obtaining CPAP, being followed by Dr. Brett Fairy. Recommend patient obtain CPAP to treat sleep apnea and follow up with her PCP for further evaluation and treatment of allergic symptoms. If patient continues to have worsening fatigue could consider further cardiovascular evaluation.   2.  PACs/PVCs Patient's palpitations are chronic and stable. Again discussed the option of additional medical therapy to treat symptomatic PVC/PAC, however patient wishes to hold off at this time.   3.  Rhinitis Suspect patient's seasonal allergies are contributing to fatigue as well as cough and congestion. Recommend patient follow up with PCP for further evaluation and management.   Patient is otherwise stable from cardiovascular standpoint. Advised patient to notify our office if fatigue continues despite sleep apnea and allergy treatment. Otherwise follow up in 1 year as previous scheduled, sooner if needed.    Alethia Berthold, PA-C 10/10/2020, 9:13 AM Office: [phone]

## 2020-10-11 ENCOUNTER — Telehealth: Payer: Self-pay | Admitting: Neurology

## 2020-10-11 NOTE — Telephone Encounter (Signed)
Pt called said she is confused on how to work the machine, wondering if someone could walk her through it. Pt requesting a call back.

## 2020-10-11 NOTE — Telephone Encounter (Signed)
Called pt to schedule initial cpap and pt's husband stated that they would have to call us back.

## 2020-10-12 NOTE — Telephone Encounter (Signed)
I have sent a email to the company that she got the machine from. When she picked up her machine they should have provided this education with her. I will have the DME contact her and provide education.

## 2020-10-12 NOTE — Telephone Encounter (Signed)
[employer] has informed me that someone has contacted the patient and reached out to assist in this matter.

## 2020-11-08 ENCOUNTER — Encounter: Payer: Self-pay | Admitting: Neurology

## 2020-11-13 NOTE — Telephone Encounter (Signed)
We can meet and discuss partial improvement of apnea by other means.   Dental device,  positional therapy,

## 2020-12-06 ENCOUNTER — Encounter: Payer: Self-pay | Admitting: Neurology

## 2020-12-07 ENCOUNTER — Ambulatory Visit: Payer: Medicare PPO | Admitting: Neurology

## 2020-12-07 ENCOUNTER — Encounter: Payer: Self-pay | Admitting: Neurology

## 2020-12-07 ENCOUNTER — Other Ambulatory Visit: Payer: Self-pay

## 2020-12-07 VITALS — BP 148/73 | HR 62 | Ht 62.0 in | Wt 125.0 lb

## 2020-12-07 DIAGNOSIS — I491 Atrial premature depolarization: Secondary | ICD-10-CM

## 2020-12-07 NOTE — Patient Instructions (Signed)
PRN revisit if sleep deteriorates or if atrial fib arises.

## 2020-12-07 NOTE — Progress Notes (Signed)
SLEEP MEDICINE CLINIC    Provider:  Larey Seat, MD  Primary Care Physician:  Chesley Noon, MD [place] [id number]     Referring Provider: Dr. Einar Gip Seen by  Dr. Virgina Jock  , Lawerance Cruel, [place]           Chief Complaint according to patient   Patient presents with:     New Patient (Initial Visit)     Pt alone, rm 10. Presents today for initial sleep eval per cardiology rec. She has never had a SS. States that she has been struggling with irregular heart rhythm and they have completed work up and this is next step.  Avg 7-8 hrs of sleep, broken sleep, wakes up to void. Sometimes has to take melatonin to go back to sleep       HISTORY OF PRESENT ILLNESS:  Hailey Fox is a 80 - year- old Caucasian female patient and seen here on 12/07/2020 from cardiology.  The patient underwent a HST , was diagnosed with mild , non REM dependent apnea, at AHI of 17.1/h.  She tried CPAP and couldn't tolerate it, it created anxiety. Nocturia did not improve, related to bladder prolapse.  She feels her sleep is better without CPAP and she is not even seeing a need for dental device use. I  showed her a moses device as an example. She is a mild snorer. She would like to not start therapy.      Chief concern according to patient : irregular heart beats, not atrial fibrillation- while she wore a cardiac monitor,  there were several heart stops, she was made aware off by PA".      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 Sleep relevant medical history: Nocturia 2-3 times, at 1.30 AM and at 5 AM.     Family medical /sleep history: No other family member on CPAP with OSA, insomnia, sleep walkers.    Social history:   Patient is retired from [employer] , Dispensing optician. She  lives in a household with spouse, an adult daughter died of multiple myeloma and lymphoma after 2 stem-cell transplants, another daughter is alive and well- 3 grandchildren. Husband is blind. Tobacco use:  Quit 50 plus years ago, started in college- ETOH use ; irregular- 1-2 drinks a month , Caffeine intake in form of Coffee( none anymore- quit 2018 after an abnormal stress test- PACs) . Regular exercise in form of walking with the dog.  Hobbies : reading.     Sleep habits are as follows: The patient's dinner time is between 5-6 PM. They both watch jeopardy after dinner.   The patient goes to bed at 9 PM and continues to sleep for 4 hours, wakes for several  bathroom breaks, and the 5 o clock break will end her night. The preferred sleep position is left side or supine now-, with the support of 1 pillow. Dreams are reportedly frequent/vivid- lately sometimes with dream enactment. 5-7 AM is the usual rise time. The patient wakes up spontaneously.  She reports not always feeling refreshed or restored in AM, with symptoms such as dry mouth , only rarely morning headaches, and residual fatigue.  Naps are taken infrequently, lasting from 1-2 hours  and are more refreshing than nocturnal  sleep.    Review of Systems: Out of a complete 14 system review, the patient complains of only the following symptoms, and all other reviewed systems are negative.:   snoring, enactment of dream,  Nocturia fragmented sleep,  short sleeper, early sleep phase.  Averages 7-9 hours. .    How likely are you to doze in the following situations: 0 = not likely, 1 = slight chance, 2 = moderate chance, 3 = high chance   Sitting and Reading? Watching Television? Sitting inactive in a public place (theater or meeting)? As a passenger in a car for an hour without a break? Lying down in the afternoon when circumstances permit? Sitting and talking to  someone? Sitting quietly after lunch without alcohol? In a car, while stopped for a few minutes in traffic?   Total = 2 from 5 / 24 points   FSS endorsed at  19 from 29/ 63 points.   Social History   Socioeconomic History   Marital status: Married    Spouse name: Not on file   Number of children: 2   Years of education: Not on file   Highest education level: Not on file  Occupational History   Not on file  Tobacco Use   Smoking status: Former    Packs/day: 1.00    Years: 15.00    Pack years: 15.00    Types: Cigarettes    Quit date: 1970    Years since quitting: 52.7   Smokeless tobacco: Never   Tobacco comments:    quit smoking 1970's  Vaping Use   Vaping Use: Never used  Substance and Sexual Activity   Alcohol use: Yes    Alcohol/week: 3.0 standard drinks    Types: 3 Glasses of wine per week    Comment: rare   Drug use: No   Sexual activity: Not on file  Other Topics Concern   Not on file  Social History Narrative   One child is deceased   Social Determinants of Health   Financial Resource Strain: Not on file  Food Insecurity: Not on file  Transportation Needs: Not on file  Physical Activity: Not on file  Stress: Not on file  Social Connections: Not on file    Family History  Problem Relation Age of Onset   Heart disease Mother    Dementia Mother    Cancer Paternal Aunt        breast   Kidney failure Maternal Grandmother    Heart attack Maternal Grandfather     Past Medical History:  Diagnosis Date   Allergy    Arthritis    Cancer ([place])    3 skin cancers removed. 2 on legs, 1 from face   Cataract    bil   Complication of anesthesia    slow to wake up   Dysrhythmia    PAC's    GERD (gastroesophageal reflux disease)    Osteoporosis    PONV (postoperative nausea and vomiting)     Past Surgical History:  Procedure Laterality Date   APPENDECTOMY  early 80's   BREAST SURGERY Right 2003   [place] Left 08/18/2012   Procedure: LEFT INGUINAL HERNIA REPAIR;  Surgeon: Haywood Lasso, MD;  Location: [place];  Service: General;  Laterality: Left;   PARATHYROIDECTOMY  [id number]   [place]  early 80's     Current Outpatient Medications on File Prior  to Visit  Medication Sig Dispense Refill   acetaminophen (TYLENOL) 500 MG tablet Take 500 mg by mouth 2 (two) times daily.     Biotin 1 MG CAPS Take 1 mg by mouth daily.     Calcium Carbonate-Vit D-Min (CALTRATE 600+D PLUS MINERALS) 600-800 MG-UNIT TABS Take 2 tablets by mouth daily.     Cholecalciferol (VITAMIN D-3 PO) Take 2,000 mg by mouth daily.     Coenzyme Q10 (COQ10 PO) Take 10 mLs by mouth daily after breakfast.     denosumab (PROLIA) 60 MG/ML SOSY injection Inject into the skin every 6 (six) months.     KRILL OIL PO Take 1 tablet by mouth daily.     melatonin 5 MG TABS Take by mouth.     mometasone (ELOCON) 0.1 % lotion daily as needed (for face).     PREMARIN vaginal cream      rosuvastatin (CRESTOR) 10 MG tablet Take 1 tablet by mouth daily.     No current facility-administered medications on file prior to visit.    Allergies  Allergen Reactions   Codeine     blackouts   Contrast Media [Iodinated Diagnostic Agents] Hives   Macrodantin [Nitrofurantoin Macrocrystal]    Ciprofloxacin     Other reaction(s): Tachycardia / Palpitations  (intolerance) Rapid pulse   Iohexol      Desc: IMMEDIATE DEVELOPMENT OF HIVES POST INJECTION    Tramadol Anxiety and Other (See Comments)    Weakness.    Physical exam:  Today's Vitals   12/07/20 1034  BP: (!) 148/73  Pulse: 62  Weight: 125 lb (56.7 kg)  Height: '5\' 2"'  (1.575 m)   Body mass index is 22.86 kg/m.   Wt Readings from Last 3 Encounters:  12/07/20 125 lb (56.7 kg)  10/09/20 126 lb (57.2 kg)  08/24/20 128 lb (58.1 kg)     Ht Readings from Last 3 Encounters:  12/07/20 '5\' 2"'  (1.575 m)  10/09/20 '5\' 2"'  (1.575 m)  08/24/20 '5\' 2"'  (1.575  m)      General: The patient is awake, alert and appears not in acute distress. The patient is well groomed. Head: Normocephalic, atraumatic. Neck is supple.  Mallampati 2, pale mucosa postnasal drip.   neck circumference:13 inches . Nasal airflow restricted.  Retrognathia is not seen.  Dental status: biological  Cardiovascular:  Regular rate and cardiac rhythm by pulse,  without distended neck veins. Respiratory:  Skin:  With evidence of mild ankle edema, bilateral . Trunk: The patient's posture is erect.   Neurologic exam : The patient is awake and alert, oriented to place and time.   Memory subjective described as intact.  Attention span & concentration ability appears normal.  Speech is fluent, without  dysarthria, dysphonia or aphasia.  Mood and affect are appropriate.   Cranial nerves: no loss of smell or taste reported  Pupils are equal and briskly reactive to light. Funduscopic exam deferred.   Extraocular movements in vertical and horizontal planes were intact and without nystagmus. No Diplopia. Visual fields by finger perimetry are intact. Hearing was intact to soft voice and finger rubbing.  Very pleasant conversation in a soft voice.   Facial sensation intact to fine touch.  Facial motor strength is symmetric and tongue and uvula move midline.  Neck ROM : rotation, tilt and flexion extension were normal for age and shoulder shrug was symmetrical.    Motor exam:  Symmetric bulk and ROM.  right shoulder is painful.  Elevated biceps tone cog -  wheeling over the right, not the left , but symmetric grip strength .   Sensory:  Fine touch, pinprick and vibration were tested  and  normal.  Proprioception tested in the upper extremities was normal.   Coordination: Rapid alternating movements in the fingers/hands were of normal speed.  The Finger-to-nose maneuver was intact without evidence of ataxia, dysmetria or tremor.   Gait and station: deferred.  Deep tendon reflexes: in  the upper and lower extremities are symmetric and intact.  Babinski response was deferred .      After spending a total time of 30 minutes face to face and additional time for physical and neurologic examination, review of laboratory studies,  personal review of imaging studies, reports and results of other testing and review of referral information / records as far as provided in visit, I have established the following assessments:  1) PACs are frequent and she has been reporting less quality of sleep. Non restorative. NOT ATRIAL FIB.   2) Nocturia cuts sleep short ( less than 6 hours on some nights) and vivid dreams may wake her early, too. Bladder prolapse related.    3) Mild OSA was present but no positional data. AHI 17/h. Not happy on CPAP, overall feels her sleep is better now than in spring, after resuming walking routine and eliminating some stressors.  She is still a caretaker. Mild snoring.     My Plan is to proceed with: patient opted for no treatment, is aware of possible dental device.   1) I would like to thank Dr. Virgina Jock and Lawerance Cruel , [place] for allowing me to meet with and to take care of this pleasant patient.     I plan to follow up PRN.  CC: I will share my notes with PCP.   Electronically signed by: Larey Seat, MD 12/07/2020 11:05 AM  [facility] Board certified by The [employer] of Sleep Medicine and Diplomate of the [employer] of Sleep Medicine. Board certified In Neurology through the [place], Fellow of the [employer] of Neurology. Medical Director of [facility].

## 2021-03-13 ENCOUNTER — Ambulatory Visit: Payer: Medicare PPO | Admitting: Student

## 2021-03-13 ENCOUNTER — Inpatient Hospital Stay: Payer: Medicare PPO

## 2021-03-13 ENCOUNTER — Encounter: Payer: Self-pay | Admitting: Student

## 2021-03-13 ENCOUNTER — Other Ambulatory Visit: Payer: Self-pay

## 2021-03-13 VITALS — BP 147/68 | HR 64 | Temp 98.3°F | Resp 17 | Ht 62.0 in | Wt 121.8 lb

## 2021-03-13 DIAGNOSIS — R002 Palpitations: Secondary | ICD-10-CM

## 2021-03-13 DIAGNOSIS — R030 Elevated blood-pressure reading, without diagnosis of hypertension: Secondary | ICD-10-CM

## 2021-03-13 DIAGNOSIS — I491 Atrial premature depolarization: Secondary | ICD-10-CM

## 2021-03-13 DIAGNOSIS — I493 Ventricular premature depolarization: Secondary | ICD-10-CM

## 2021-03-13 DIAGNOSIS — I499 Cardiac arrhythmia, unspecified: Secondary | ICD-10-CM

## 2021-03-13 NOTE — Progress Notes (Signed)
Patient referred by Eartha Inch, MD for murmur  Subjective:   Hailey Fox, female    DOB: [birth year], 81 y.o.   MRN: [id number]   Chief Complaint  Patient presents with   Irregular Heart Beat     HPI  81 y.o. Caucasian female with hypertension, hyperlipidemia, former smoker, and known PACs. Originally referred to our office for evaluation of murmur. She underwent low risk stress testing in 2015 and echocardiogram in 2021 revealing LVEF 55%, diastolic dysfunction, and mild AR and moderate MR. Patient has known first-degree AV block.  Patient reports she is enrolled in ARIC clinical study at [facility], although it is unclear what the study is for. Notably patient was diagnosed with [facility] spotted fever 11/16/2019 and treated.  Patient presents for urgent visit at her request.  She states in the last 3 days she has had 2 episodes of palpitations which were more severe than typical and lasting for most of the day.  No reports these episodes were associated with fatigue, unsteady gait.  Denies chest pain, dyspnea, syncope, near syncope.  Denies orthopnea, PND, leg edema.  Notably in the past have discussed the initiation of losartan or beta blocker therapy given aortic aneurysm and symptomatic PACs/PVCs respectively, however patient has wish to refrain from additional medications.  Patient continues to exercise on a daily basis without issue.  Past Medical History:  Diagnosis Date   Allergy    Arthritis    Cancer (HCC)    3 skin cancers removed. 2 on legs, 1 from face   Cataract    bil   Complication of anesthesia    slow to wake up   Dysrhythmia    PAC's    GERD (gastroesophageal reflux disease)    Osteoporosis    PONV (postoperative nausea and vomiting)    Past Surgical History:  Procedure Laterality Date   APPENDECTOMY  early 80's   BREAST SURGERY Right 2003   palpiloma   HERNIA REPAIR     INGUINAL HERNIA REPAIR Left 08/18/2012   Procedure: LEFT  INGUINAL HERNIA REPAIR;  Surgeon: Currie Paris, MD;  Location: [facility] OR;  Service: General;  Laterality: Left;   [id number]  [id number]   TONSILLECTOMY  1945   TUBAL LIGATION  early 5's   Social History   Tobacco Use  Smoking Status Former   Packs/day: 1.00   Years: 15.00   Pack years: 15.00   Types: Cigarettes   Quit date: 1970   Years since quitting: 53.0  Smokeless Tobacco Never  Tobacco Comments   quit smoking 1970's   Social History   Substance and Sexual Activity  Alcohol Use Yes   Alcohol/week: 3.0 standard drinks   Types: 3 Glasses of wine per week   Comment: rare   Family History  Problem Relation Age of Onset   Heart disease Mother    Dementia Mother    Cancer Paternal Aunt        breast   Kidney failure Maternal Grandmother    Heart attack Maternal Grandfather    Current Outpatient Medications on File Prior to Visit  Medication Sig Dispense Refill   acetaminophen (TYLENOL) 500 MG tablet Take 500 mg by mouth 2 (two) times daily.     azelastine (ASTELIN) 0.1 % nasal spray Place 1 spray into both nostrils as needed.     Biotin 1 MG CAPS Take 1 mg by mouth daily.     Calcium Carbonate-Vit D-Min (CALTRATE 600+D PLUS MINERALS)  600-800 MG-UNIT TABS Take 2 tablets by mouth daily.     Cholecalciferol (VITAMIN D-3 PO) Take 2,000 mg by mouth daily.     Coenzyme Q10 (COQ10 PO) Take 10 mLs by mouth daily after breakfast.     denosumab (PROLIA) 60 MG/ML SOSY injection Inject into the skin every 6 (six) months.     diclofenac Sodium (VOLTAREN) 1 % GEL Apply 1 application topically as needed.     KRILL OIL PO Take 1 tablet by mouth daily.     melatonin 5 MG TABS Take by mouth.     mometasone (ELOCON) 0.1 % lotion daily as needed (for face).     PREMARIN vaginal cream      rosuvastatin (CRESTOR) 10 MG tablet Take 1 tablet by mouth daily.     No current facility-administered medications on file prior to visit.    Cardiovascular and other pertinent studies: EKG  03/13/2021: Sinus rhythm with first-degree AV block at a rate of 60 bpm.  Normal axis.  Poor R wave progression, cannot exclude anteroseptal infarct old.  Nonspecific T of abnormality.  EKG 10/09/2020: Sinus rhythm at a rate of 62 bpm with first-degree AV block.  Normal axis.  PCV ECHOCARDIOGRAM COMPLETE 06/08/2020 Left ventricle cavity is normal in size and wall thickness. Normal global wall motion. Normal LV systolic function with EF 66%. Doppler evidence of grade I (impaired) diastolic dysfunction, normal LAP. Structurally normal trileaflet aortic valve. Mild (Grade I) aortic regurgitation. Mild (Grade I) mitral regurgitation. Mild tricuspid regurgitation. Estimated pulmonary artery systolic pressure 29 mmHg. No significant change compared to previous study on 10/25/2019.  PCV MYOCARDIAL PERFUSION WO LEXISCAN 06/05/2020 Lexiscan nuclear stress test performed using 1-day protocol. Stress EKG is non-diagnostic for ischemia as it's a pharmacologic stress test using Lexiscan. Normal myocardial perfusion. Mild decrease in tracer uptake in apical septal myocardium both at rest and stress likely due to tissue attenuation.  STress LVEF 69%. Low risk study.  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 Small left renal cysts  Severe remote L1 compression fracture   Ambulatory cardiac telemetry 04/12/2020-04/26/2020: Notably patient wore 2 separate monitors over the course of 14 days, for a total analysis time of 12 days 22 hours.   Predominant underlying rhythm was sinus with first-degree AV block.  Minimum heart rate of 21 bpm, maximum heart rate of 74 bpm, average heart rate of 60-67 bpm.  Patient with 74 episodes of supraventricular tachycardia, including episodes suggestive rather of sinus tachycardia and some episodes rather  suggestive of atrial tachycardia, with the fastest lasting 5 beats, maximum rate 174 bpm and longest lasting 6 minutes.  Patient with 1 sinus pause was >3 seconds, lasting 4 seconds occurring at night and asymptomatic.  Intermittent second-degree AV block Mobitz type II present, patient asymptomatic.  Isolated PACs were occasional, with PAC couplets/triplets as well as isolated PVCs and PVC couplets were all rare.  Ventricular bigeminy and trigeminy were present. No ventricular tachycardia, atrial fibrillation.  Patient symptoms correlated with sinus rhythm, PACs and PVCs.  EKG 04/25/2020: Sinus rhythm with first-degree AV block at a rate of 62 bpm.  Left atrial enlargement.  Normal axis. No underlying ischemia or injury pattern. Compared to EKG 04/12/2020, no significant change.  PCV ECHOCARDIOGRAM COMPLETE 10/25/2019 Left ventricle cavity is normal in size. Mild concentric hypertrophy  of the left ventricle. Normal global wall motion. Normal LV systolic function with EF 55%. Doppler evidence of grade I (impaired) diastolic dysfunction, normal LAP. Left atrial cavity is mildly dilated. Trileaflet aortic valve.  Mild (Grade I) aortic regurgitation. Moderate (Grade II) mitral regurgitation. Mild to moderate tricuspid regurgitation. Estimated pulmonary artery systolic pressure 28 mmHg. Compared to previous study in 2015, mild increase in severity of mitral and tricuspid regurgitation.  AAA screening 10/2019: Proximal aorta: 23 x 27 mm.  Peak systolic velocity is 62 cm/s.  Waveform is Normal.  Mid aorta: 18 x 19 mm.  Distal aorta: 18 x 21 mm.  Common iliac arteries:  11 mm.  Inferior vena cava:  Normal where visible  Additional findings:  None  Echocardiogram 2015: EF 50-55%.  Grade 1 diastolic dysfunction.  Trace pulmonary regurgitation.  Exercise treadmill stress test 2015: 7.3 METS.  No ischemic changes.  Occasional PAC, atrial bigeminy.   Recent labs: 04/25/2020: BNP 46.3 Glucose 77, BUN 11,  creatinine 0.99, GFR 54, sodium 140, potassium 4.1 TSH 1.46, Hemoglobin 14, hematocrit 42.5, MCV 89, platelets 256  11/16/2019: Hemoglobin 13.1, hematocrit 39.2, MCV 80, platelets 231 glucose 131, creatinine 0.93, GFR 58, BUN 18, sodium 138, potassium 3.7 Magnesium 1.8  09/23/2019: Glucose 91, BUN/Cr 19/0.9. EGFR 61. Na/K 140/4.4. Rest of the CMP normal H/H 12.8/40. MCV 90. Platelets 211 Chol 198, TG 67, HDL 78, LDL 108 TSH 1.4 normal    Review of Systems  Constitutional: Positive for malaise/fatigue.  Cardiovascular:  Positive for palpitations. Negative for chest pain, claudication, leg swelling, near-syncope, orthopnea, paroxysmal nocturnal dyspnea and syncope.  Respiratory:  Negative for shortness of breath.   Neurological:  Negative for dizziness.        Vitals:   03/13/21 1412 03/13/21 1420  BP: (!) 174/81 (!) 147/68  Pulse: 71 64  Resp: 17   Temp: 98.3 F (36.8 C)   SpO2: 97% 97%     Body mass index is 22.28 kg/m. Filed Weights   03/13/21 1412  Weight: 121 lb 12.8 oz (55.2 kg)    Orthostatic VS for the past 72 hrs (Last 3 readings):  Patient Position BP Location Cuff Size  03/13/21 1420 Sitting Left Arm Normal  03/13/21 1412 Sitting Left Arm Small     Objective:   Physical Exam Vitals reviewed.  Constitutional:      Appearance: Normal appearance.  Neck:     Vascular: No carotid bruit.  Cardiovascular:     Rate and Rhythm: Normal rate and regular rhythm.     Pulses: Intact distal pulses.     Heart sounds: S1 normal and S2 normal. No murmur heard.   No gallop.  Pulmonary:     Effort: Pulmonary effort is normal. No respiratory distress.     Breath sounds: No wheezing, rhonchi or rales.  Musculoskeletal:     Right lower leg: No edema.     Left lower leg: No edema.  Skin:    General: Skin is warm and dry.  Neurological:     Mental Status: She is alert.  Physical exam unchanged compared to previous office visit.      Assessment &  Recommendations:     ICD-10-CM   1. Irregular heart beat  I49.9 EKG 12-Lead    2. Elevated blood pressure reading without diagnosis of hypertension  R03.0 EKG 12-Lead    3. PAC (premature atrial contraction)  I49.1 LONG TERM MONITOR (3-14 DAYS)    4. PVC's (premature ventricular contractions)  I49.3 LONG  TERM MONITOR (3-14 DAYS)    5. Palpitations  R00.2 LONG TERM MONITOR (3-14 DAYS)      81 y.o. Caucasian female with hypertension, hyperlipidemia, former smoker, and known PACs. Originally referred to our office for evaluation of murmur. She underwent low risk stress testing in 2015 and echocardiogram in 2021 revealing LVEF 61%, diastolic dysfunction, and mild AR and moderate MR. Patient has known first-degree AV block.  PACs/PVCs Patient's palpitations are chronic  However patient has now had 2 episodes of palpitations which are more severe and longer lasting than typical. Discussed with patient options of medical therapy to treat symptomatic PACs/PVCs.  However had a long discussion with patient regarding risk versus benefit in view of her underlying conduction disease. Shared decision was to first proceed with repeat cardiac monitor given change in patient's symptoms to evaluate for underlying cardiac arrhythmias. Will not make changes to medications at this time.  Further recommendations pending repeat cardiac monitor.   Alethia Berthold, PA-C 03/13/2021, 3:53 PM Office: [phone]

## 2021-03-15 ENCOUNTER — Ambulatory Visit (HOSPITAL_BASED_OUTPATIENT_CLINIC_OR_DEPARTMENT_OTHER): Payer: Medicare PPO | Admitting: Physical Therapy

## 2021-04-02 NOTE — Therapy (Signed)
OUTPATIENT PHYSICAL THERAPY LOWER EXTREMITY EVALUATION   Patient Name: Hailey Fox MRN: [id number] DOB:[birth year], 81 y.o., female Today's Date: 04/03/2021   PT End of Session - 04/03/21 1245     Visit Number 1    Number of Visits 20    Date for PT Re-Evaluation 07/02/21    Authorization Type [employer] MCR    PT Start Time 1100    PT Stop Time 1145    PT Time Calculation (min) 45 min    Activity Tolerance Patient tolerated treatment well    Behavior During Therapy WFL for tasks assessed/performed             Past Medical History:  Diagnosis Date   Allergy    Arthritis    Cancer ([place])    3 skin cancers removed. 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   Heart palpitations 06/21/2020   PAC (premature atrial contraction) 06/21/2020   Non-restorative sleep 06/21/2020   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   Murmur 10/18/2019   Elevated blood pressure reading without diagnosis of hypertension 10/18/2019   Closed compression fracture of body of lumbar vertebra ([place]) 09/02/2017   Bilateral renal cysts 09/02/2017   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   Radiculopathy, lumbar region 03/18/2017   Degenerative tear of meniscus, left 02/13/2017   Piriformis syndrome of right  side 02/04/2017   Degenerative tear of medial meniscus of right knee 07/26/2016   Left knee pain 07/10/2016   Chronic heel pain, left 03/28/2016   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 Pt states the pain is worse some days than others. The hip pain re-started walking a lot after "breaking" her back. The hip does not feel "right" and feels like "popping" without hearing anything. Pt states the L hip is very sensitive to touch to the outside of the hip. "I also almost jumped off the table when she pressed it." She had gotten up to about 3 miles before the pain started hurting. The hip and knee limit her housework.  She is very worried about  falling.   The L knee pain started after she fell going up the stairs and she reports history of meniscus injury. Pt states the pain will ache while she is laying in bed in a certain position. Pt does states she has limited strength in the knees and has trouble getting up and down steps.  Pt's walking is now limited by the pain and her sick dog that requires her attention every 2 hours. She now currently only walks about 1 block, but not regularly.   Pt denies systemic system. Pt does have night sweats. Pt denies cancer red flags.   PERTINENT HISTORY: History of compression fracture L1, osteoporosis   PAIN:  Are you having pain? No NPRS scale: 0/10 Pain location: L lateral hip, L ant  knee Pain orientation: Left  PAIN TYPE: aching and sharp Pain description: intermittent  Aggravating factors: moving, stairs, squatting,  Relieving factors: resting  PRECAUTIONS: None  WEIGHT BEARING RESTRICTIONS No  FALLS:  Has patient fallen in last 6 months? No, Number of falls: 0  LIVING ENVIRONMENT: Lives with: lives with their family and lives with their spouse (caregiver to husband who is blind) Lives in: House/apartment Stairs: Yes;   OCCUPATION: retired Environmental consultant  PLOF: Independent  PATIENT GOALS : Pt wants to be able to walk for exercise, social outings, and mental health relief.    OBJECTIVE:   DIAGNOSTIC FINDINGS:   L knee US Impression:  Degenerative meniscus and joint space loss with moderate effusion of Left knee   IMPRESSION: New mild compression deformity of the superior endplate of vertebral body L1, concerning for acute fracture. No definite retropulsion seen. PATIENT SURVEYS:  LEFS 52/80 points (or 65.00%)  COGNITION:  Overall cognitive status: Within functional limits for tasks assessed     SENSATION:  Light touch: Appears intact   POSTURE:  Mild kyphosis, mild genu valgus in standing, R sided weight shift  PALPATION: TTP of L knee joint line and L hip greater troch, glutes, and deep hip rotators  LE AROM/PROM:  A/PROM Right 04/03/2021 Left 04/03/2021  Hip flexion WFL 95  Hip extension WFL 5  Hip abduction WFL 25  Hip adduction    Hip internal rotation WFL in seated cross and uncross 15  Hip external rotation WFL in seated cross and uncross 30   (Blank rows = not tested)  LE MMT:  MMT Right 04/03/2021 Left 04/03/2021  Hip flexion 4+/5 4/5  Hip extension 4+/5 4/5  Hip abduction 4+/5 4/5  Hip adduction 4+/5 4+/5  Hip internal rotation 4+/5 4/5  Hip external rotation 4+/5 4/5   (Blank rows = not tested)  LOWER EXTREMITY SPECIAL TESTS:  Hip special tests: Saralyn Pilar (FABER) test: negative, Trendelenburg test: positive ,  and Hip scouring test: negative  FUNCTIONAL TESTS:  5 times sit to stand: requires UE to stand SLS:   GAIT: Distance walked: 31ft Assistive device utilized: None Level of assistance: Complete Independence Comments: antalgic pattern, decreased hip extension, bilat Trendelenburg   Stairs: ascend reciprocal with R UE and lean, step to with L LE leading descend with p!   TODAY'S TREATMENT:   Exercises Supine Bridge with Resistance Band - 2 x daily - 7 x weekly - 2 sets - 10 reps Seated Figure 4 Piriformis Stretch - 2 x daily - 7 x weekly - 1 sets - 3 reps - 30 hold Sit to Stand with Arms Crossed - 1 x daily - 7 x weekly - 3 sets - 10 reps  PATIENT EDUCATION:  Education details: MOI, diagnosis, prognosis, anatomy, exercise progression, DOMS expectations, muscle firing,  envelope of function, HEP, POC  Person educated: Patient Education method: Explanation, Demonstration, Tactile cues, Verbal cues, and Handouts Education comprehension: verbalized understanding, returned demonstration, verbal cues required, and tactile cues required   HOME EXERCISE PROGRAM: Access Code: [email or web address] URL: [email or web address] Date: 04/03/2021 Prepared by: Daleen Bo  ASSESSMENT:  CLINICAL IMPRESSION: Patient is a 81 y.o. female who was seen today for physical therapy evaluation and treatment for cc of L hip and L knee pain. Pt's s/s appear consistent with GTPS and history of L knee internal derangement. Pt is largely strength limited but does have soft tissue restriction across the hip and quad. Pt's pain is moderately sensitive and irritable with movement. Objective impairments include Abnormal gait, decreased activity tolerance, decreased endurance, decreased mobility, difficulty walking, decreased ROM, decreased strength, hypomobility, increased muscle spasms, impaired flexibility, improper body mechanics, postural dysfunction, and pain. These impairments are limiting patient from  cleaning, community activity, driving, meal prep, laundry, yard work, shopping, and exercise and caregiver duties . Personal factors including Age, Fitness, Past/current experiences, Time since onset of injury/illness/exacerbation, and 1-2 comorbidities:    are also affecting patient's functional outcome. Patient will benefit from skilled PT to address above impairments and improve overall function.  REHAB POTENTIAL: Good  CLINICAL DECISION MAKING: Stable/uncomplicated  EVALUATION COMPLEXITY: Low   GOALS:   SHORT TERM GOALS:  STG Name Target Date Goal status  1 Pt will become independent with HEP in order to demonstrate synthesis of PT education.  Baseline:  05/15/2021 INITIAL  2 Pt will report at least 2 pt reduction on NPRS scale for pain in order to demonstrate functional improvement with household activity, self care, and ADL.  Baseline:  05/15/2021 INITIAL  3 Pt will be able to demonstrate STS without UE in order to demonstrate functional improvement in LE function for self-care and house hold duties.  Baseline: 05/15/2021 INITIAL  4 Pt will be able to demonstrate/report ability to walk >15 mins without pain in order to demonstrate functional improvement and tolerance to exercise and community mobility.  Baseline: 05/15/2021 INITIAL   LONG TERM GOALS:   LTG Name Target Date Goal status  1 Pt  will become independent with final HEP in order to demonstrate synthesis of PT education.  Baseline: 06/26/2021 INITIAL  2 Pt will have an at least 18 pt improvement in LEFS measure in order to demonstrate MCID improvement in daily function.  06/26/2021 INITIAL  3 Pt will be able to perform 5XSTS in under 12s  in order to demonstrate functional improvement above the cut off score for adults.  Baseline: 06/26/2021 INITIAL  4 Pt will be able to demonstrate reciprocal stair management in order to demonstrate functional improvement in LE function for self-care and house hold mobility.  Baseline:  06/26/2021 INITIAL   PLAN: PT FREQUENCY: 1-2x/week  PT DURATION: 12 weeks (likely D/C by 10)   PLANNED INTERVENTIONS: Therapeutic exercises, Therapeutic activity, Neuro Muscular re-education, Balance training, Gait training, Patient/Family education, Joint mobilization, Stair training, Prosthetic training, DME instructions, Aquatic Therapy, Dry Needling, Electrical stimulation, Spinal mobilization, Cryotherapy, Moist heat, Taping, Vasopneumatic device, Traction, Ultrasound, Ionotophoresis 4mg /ml Dexamethasone, and Manual therapy  PLAN FOR NEXT SESSION: review HEP, hip ABD strength, standing balance, knee extensor strength   Daleen Bo, PT 04/03/2021, 2:43 PM   Referring diagnosis? Left hip pain Left knee pain Left hip bursitis OA  Treatment diagnosis? (if different than referring diagnosis) m25.552, m25.652, m25.562,  r26.2, m6.81  What was this (referring dx) caused by? []  Surgery [x]  Fall [x]  Ongoing issue [x]  Arthritis []  Other: ____________  Laterality: []  Rt [x]  Lt []  Both  Check all possible CPT codes:      []  97110 (Therapeutic Exercise)  []  92507 (SLP Treatment)  []  97112 (Neuro Re-ed)   []  92526 (Swallowing Treatment)   []  97116 (Gait Training)   []  [id number] (Cognitive Training, 1st 15 minutes) []  97140 (Manual Therapy)   []  97130 (Cognitive Training, each add'l 15 minutes)  []  97530 (Therapeutic Activities)  []  Other, List CPT Code ____________    []  36629 (Self Care)       [x]  All codes above (97110 - 97535)  [x]  97012 (Mechanical Traction)  [x]  97014 (E-stim Unattended)  [x]  97032 (E-stim manual)  [x]  97033 (Ionto)  [x]  97035 (Ultrasound)  [x]  97760 (Orthotic Fit) []  [id number] (Physical Performance Training) [x]  [id number] (Aquatic Therapy) []  97034 (Contrast Bath) []  [id number] (Paraffin) []  97597 (Wound Care 1st 20 sq cm) []  97598 (Wound Care each add'l 20 sq cm) []  97016 (Vasopneumatic Device) []  [id number] Comptroller) []  [id number] (Prosthetic Training)

## 2021-04-03 ENCOUNTER — Encounter (HOSPITAL_BASED_OUTPATIENT_CLINIC_OR_DEPARTMENT_OTHER): Payer: Self-pay | Admitting: Physical Therapy

## 2021-04-03 ENCOUNTER — Other Ambulatory Visit: Payer: Self-pay

## 2021-04-03 ENCOUNTER — Ambulatory Visit (HOSPITAL_BASED_OUTPATIENT_CLINIC_OR_DEPARTMENT_OTHER): Payer: Medicare PPO | Attending: Sports Medicine | Admitting: Physical Therapy

## 2021-04-03 DIAGNOSIS — M6281 Muscle weakness (generalized): Secondary | ICD-10-CM | POA: Insufficient documentation

## 2021-04-03 DIAGNOSIS — M719 Bursopathy, unspecified: Secondary | ICD-10-CM | POA: Diagnosis not present

## 2021-04-03 DIAGNOSIS — M25652 Stiffness of left hip, not elsewhere classified: Secondary | ICD-10-CM | POA: Insufficient documentation

## 2021-04-03 DIAGNOSIS — M25562 Pain in left knee: Secondary | ICD-10-CM | POA: Insufficient documentation

## 2021-04-03 DIAGNOSIS — M25552 Pain in left hip: Secondary | ICD-10-CM | POA: Diagnosis not present

## 2021-04-03 DIAGNOSIS — G8929 Other chronic pain: Secondary | ICD-10-CM | POA: Diagnosis not present

## 2021-04-03 DIAGNOSIS — R262 Difficulty in walking, not elsewhere classified: Secondary | ICD-10-CM | POA: Insufficient documentation

## 2021-04-06 ENCOUNTER — Telehealth: Payer: Self-pay | Admitting: Student

## 2021-04-06 NOTE — Telephone Encounter (Signed)
Pt is req a call back regarding monitor results if available if not she req a call back with the update.

## 2021-04-09 NOTE — Progress Notes (Signed)
Reviewed and discussed results with patient.  Given underlying second-degree AV block would not recommend initiation of AV nodal blocking agents to treat patient's palpitations.  Simply reassured her.  Reviewed this plan with Dr. Nicolette Bang who is in agreement.

## 2021-04-10 ENCOUNTER — Other Ambulatory Visit: Payer: Self-pay

## 2021-04-10 ENCOUNTER — Encounter (HOSPITAL_BASED_OUTPATIENT_CLINIC_OR_DEPARTMENT_OTHER): Payer: Self-pay | Admitting: Physical Therapy

## 2021-04-10 ENCOUNTER — Ambulatory Visit (HOSPITAL_BASED_OUTPATIENT_CLINIC_OR_DEPARTMENT_OTHER): Payer: Medicare PPO | Attending: Sports Medicine | Admitting: Physical Therapy

## 2021-04-10 DIAGNOSIS — M25562 Pain in left knee: Secondary | ICD-10-CM | POA: Insufficient documentation

## 2021-04-10 DIAGNOSIS — M25552 Pain in left hip: Secondary | ICD-10-CM | POA: Insufficient documentation

## 2021-04-10 DIAGNOSIS — R262 Difficulty in walking, not elsewhere classified: Secondary | ICD-10-CM | POA: Diagnosis present

## 2021-04-10 DIAGNOSIS — M25652 Stiffness of left hip, not elsewhere classified: Secondary | ICD-10-CM | POA: Diagnosis present

## 2021-04-10 DIAGNOSIS — M6281 Muscle weakness (generalized): Secondary | ICD-10-CM | POA: Diagnosis present

## 2021-04-10 DIAGNOSIS — G8929 Other chronic pain: Secondary | ICD-10-CM | POA: Insufficient documentation

## 2021-04-10 NOTE — Therapy (Signed)
OUTPATIENT PHYSICAL THERAPY TREATMENT NOTE   Patient Name: Hailey Fox MRN: [id number] DOB:12-18-1940, 81 y.o., female Today's Date: 04/10/2021  PCP: Chesley Noon, MD REFERRING PROVIDER: Chesley Noon, MD   PT End of Session - 04/10/21 1658     Visit Number 2    Number of Visits 20    Date for PT Re-Evaluation 07/02/21    Authorization Type [employer] MCR    PT Start Time 1625    PT Stop Time 1705    PT Time Calculation (min) 40 min    Activity Tolerance Patient tolerated treatment well    Behavior During Therapy WFL for tasks assessed/performed             Past Medical History:  Diagnosis Date   Allergy    Arthritis    Cancer ([place])    3 skin cancers removed. 2 on legs, 1 from face   Cataract    bil   Complication of anesthesia    slow to wake up   Dysrhythmia    PAC's    GERD (gastroesophageal reflux disease)    Osteoporosis    PONV (postoperative nausea and vomiting)    Past Surgical History:  Procedure Laterality Date   APPENDECTOMY  early 80's   BREAST SURGERY Right 2003   palpiloma   HERNIA REPAIR     INGUINAL HERNIA REPAIR Left 08/18/2012   Procedure: LEFT INGUINAL HERNIA REPAIR;  Surgeon: Haywood Lasso, MD;  Location: [facility];  Service: General;  Laterality: Left;   PARATHYROIDECTOMY  [id number]   [place]  early 80's   Patient Active Problem List   Diagnosis Date Noted   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   Heart palpitations 06/21/2020   PAC (premature atrial contraction) 06/21/2020   Non-restorative sleep 06/21/2020   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   Murmur 10/18/2019   Elevated blood pressure reading without diagnosis of hypertension 10/18/2019   Closed compression fracture of body of lumbar vertebra ([place]) 09/02/2017   Bilateral renal cysts 09/02/2017   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   Radiculopathy, lumbar region 03/18/2017   Degenerative tear of  meniscus, left 02/13/2017   Piriformis syndrome of right side 02/04/2017   Degenerative tear of medial meniscus of right knee 07/26/2016   Left knee pain 07/10/2016   Chronic heel pain, left 03/28/2016   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 No NPRS scale: 2/10 Pain location: L lateral hip, L ant knee Pain orientation: Left  PAIN TYPE: aching and sharp Pain description: intermittent  Aggravating factors: moving, stairs, squatting,  Relieving factors: resting  PRECAUTIONS: None  WEIGHT BEARING RESTRICTIONS No  FALLS:  Has patient fallen in last 6 months? No, Number of falls: 0  LIVING ENVIRONMENT: Lives with: lives with their family and lives with their spouse (caregiver to husband who is blind) Lives in: House/apartment Stairs: Yes;   OCCUPATION: retired  Environmental consultant  PLOF: Independent  PATIENT GOALS : Pt wants to be able to walk for exercise, social outings, and mental health relief.    OBJECTIVE:   DIAGNOSTIC FINDINGS:   L knee US Impression:  Degenerative meniscus and joint space loss with moderate effusion of Left knee   IMPRESSION: New mild compression deformity of the superior endplate of vertebral body L1, concerning for acute fracture. No definite retropulsion seen. PATIENT SURVEYS:  LEFS 52/80 points (or 65.00%)  COGNITION:  Overall cognitive status: Within functional limits for tasks assessed     SENSATION:  Light touch: Appears  intact   POSTURE:  Mild kyphosis, mild genu valgus in standing, R sided weight shift  PALPATION: TTP of L knee joint line and L hip greater troch, glutes, and deep hip rotators  LE AROM/PROM:  A/PROM Right 04/03/2021 Left 04/03/2021  Hip flexion WFL 95  Hip extension WFL 5  Hip abduction WFL 25  Hip adduction    Hip internal rotation WFL in seated cross and uncross 15  Hip external rotation WFL in seated cross and uncross 30   (Blank rows = not tested)  LE MMT:  MMT Right 04/03/2021 Left 04/03/2021  Hip flexion 4+/5 4/5  Hip extension 4+/5 4/5  Hip abduction 4+/5 4/5  Hip adduction 4+/5 4+/5  Hip internal rotation 4+/5 4/5  Hip external rotation 4+/5 4/5   (Blank rows = not tested)  LOWER EXTREMITY SPECIAL TESTS:  Hip special tests: Saralyn Pilar (FABER) test: negative, Trendelenburg test: positive , and Hip scouring test: negative  FUNCTIONAL TESTS:  5 times sit to stand: requires UE to stand SLS:   GAIT: Distance walked: 30ft Assistive device utilized: None Level of assistance: Complete Independence Comments: antalgic pattern, decreased hip extension, bilat Trendelenburg   Stairs: ascend reciprocal with R UE and lean, step to with L LE leading descend with p!   TODAY'S TREATMENT: Pt seen for aquatic therapy today.  Treatment took place in water 3.25-4.8 ft in depth at the [facility] pool. Temp of water was 87.  Pt entered/exited the pool via stairs step to pattern with bilat rail and CGA.  Reviewed current function, pain levels, response to prior Rx, and HEP compliance.    Introduction to setting. Pt submerged to varying depths while education on properties of water and benefits of aquatic therapy. Walking supported by yellow noodle 4 widths forward, back, and side stepping.  Vc and SBA.  Exercises Submerged in 4 ft standing holding to wall: high   knee marching; add/abd, hip extension 2x10 reps. -DF and PF x 20  Seated: stretching hamstrings, gastroc  2x20 seconds.  -flutter kicking 2 x 10; add/abd x 10; cycling x10.   Cues throughout for abdominal bracing improving core strength and control   Pt requires buoyancy for support and to offload joints with strengthening exercises. Viscosity of the water is needed for resistance of strengthening; water current perturbations provides challenge to standing balance unsupported, requiring increased core activation.      PATIENT EDUCATION:  Education details: MOI, diagnosis, prognosis, anatomy, exercise progression, DOMS expectations, muscle firing,  envelope of function, HEP, POC  Person educated: Patient Education method: Explanation, Demonstration, Tactile cues, Verbal cues, and Handouts Education comprehension: verbalized understanding, returned demonstration, verbal cues required, and tactile cues required   HOME EXERCISE PROGRAM: Access Code: [email or web address] URL: [email or web address] Date: 04/03/2021 Prepared by: Daleen Bo  ASSESSMENT:  CLINICAL IMPRESSION: Pt initially hesitant entering water but increases in confidence as session progresses.  She is  directed through ROM and strengthening exercises in all depths.  She has some minimal LOB which she recovers indep.  Some difficulty with maintaining COG and COB in straight line in seated position requiring vc for decreasing uncontrolled buoyancy.  She reports no increased sx with session but decreased pain to 0/10 while submerged.  Pt able to  reach end ROM with hip and knee flex while submerged 80% as well as approaching similar with abduction.  States that it feels good to move and stretch.  She is a good candidate for aquatic therapy to facilitate and hasten progression towards goals, improving overall function, decreasing fall risk.  Patient is a 81 y.o. female who was seen today for physical therapy evaluation and treatment for cc of L hip and L knee pain. Pt's s/s appear consistent with GTPS and history of L knee internal  derangement. Pt is largely strength limited but does have soft tissue restriction across the hip and quad. Pt's pain is moderately sensitive and irritable with movement. Objective impairments include Abnormal gait, decreased activity tolerance, decreased endurance, decreased mobility, difficulty walking, decreased ROM, decreased strength, hypomobility, increased muscle spasms, impaired flexibility, improper body mechanics, postural dysfunction, and pain. These impairments are limiting patient from cleaning, community activity, driving, meal prep, laundry, yard work, shopping, and exercise and caregiver duties. Personal factors including Age, Fitness, Past/current experiences, Time since onset of injury/illness/exacerbation, and 1-2 comorbidities:  are also affecting patient's functional outcome. Patient will benefit from skilled PT to address above impairments and improve overall function.  REHAB POTENTIAL: Good  CLINICAL DECISION MAKING: Stable/uncomplicated  EVALUATION COMPLEXITY: Low   GOALS:   SHORT TERM GOALS:  STG Name Target Date Goal status  1 Pt will become independent with HEP in order to demonstrate synthesis of PT education.  Baseline:  05/15/2021 INITIAL  2 Pt will report at least 2 pt reduction on NPRS scale for pain in order to demonstrate functional improvement with household activity, self care, and ADL.  Baseline:  05/15/2021 INITIAL  3 Pt will be able to demonstrate STS without UE in order to demonstrate functional improvement in LE function for self-care and house hold duties.  Baseline: 05/15/2021 INITIAL  4 Pt will be able to demonstrate/report ability to walk >15 mins without pain in order to demonstrate functional improvement and tolerance to exercise and community mobility.  Baseline: 05/15/2021 INITIAL   LONG TERM GOALS:   LTG Name Target Date Goal status  1 Pt  will become independent with final HEP in order to demonstrate synthesis of PT education.  Baseline:  06/26/2021 INITIAL  2 Pt will have an at least 18 pt improvement in LEFS measure in order to demonstrate MCID improvement in daily function.  06/26/2021 INITIAL  3 Pt will be able to perform 5XSTS in under 12s  in order to demonstrate functional improvement above the cut off score for adults.  Baseline: 06/26/2021 INITIAL  4 Pt will be able to demonstrate reciprocal stair management in order to demonstrate functional improvement in LE function for self-care and house hold mobility.  Baseline: 06/26/2021 INITIAL   PLAN: PT FREQUENCY: 1-2x/week  PT DURATION: 12 weeks (likely D/C by 10)   PLANNED INTERVENTIONS: Therapeutic exercises, Therapeutic activity, Neuro Muscular re-education, Balance training, Gait training, Patient/Family education, Joint mobilization, Stair training, Prosthetic training, DME instructions, Aquatic Therapy, Dry Needling, Electrical stimulation, Spinal mobilization, Cryotherapy, Moist heat, Taping, Vasopneumatic device, Traction, Ultrasound, Ionotophoresis 4mg /ml Dexamethasone, and Manual therapy  PLAN FOR NEXT SESSION: review HEP, hip ABD strength,  standing balance, knee extensor strength   Stanton Kidney (Frankie) Jonavon Trieu MPT 04/10/2021, 5:29 PM

## 2021-04-19 ENCOUNTER — Other Ambulatory Visit: Payer: Self-pay

## 2021-04-19 ENCOUNTER — Ambulatory Visit (HOSPITAL_BASED_OUTPATIENT_CLINIC_OR_DEPARTMENT_OTHER): Payer: Medicare PPO | Admitting: Physical Therapy

## 2021-04-19 ENCOUNTER — Encounter (HOSPITAL_BASED_OUTPATIENT_CLINIC_OR_DEPARTMENT_OTHER): Payer: Self-pay | Admitting: Physical Therapy

## 2021-04-19 DIAGNOSIS — M25552 Pain in left hip: Secondary | ICD-10-CM

## 2021-04-19 DIAGNOSIS — M6281 Muscle weakness (generalized): Secondary | ICD-10-CM

## 2021-04-19 DIAGNOSIS — G8929 Other chronic pain: Secondary | ICD-10-CM

## 2021-04-19 DIAGNOSIS — R262 Difficulty in walking, not elsewhere classified: Secondary | ICD-10-CM

## 2021-04-19 DIAGNOSIS — M25652 Stiffness of left hip, not elsewhere classified: Secondary | ICD-10-CM

## 2021-04-19 NOTE — Therapy (Signed)
OUTPATIENT PHYSICAL THERAPY TREATMENT NOTE   Patient Name: Hailey Fox MRN: [id number] DOB:Apr 13, 1940, 81 y.o., female Today's Date: 04/19/2021  PCP: Chesley Noon, MD REFERRING PROVIDER: Chesley Noon, MD   PT End of Session - 04/19/21 [phone]     Visit Number 3    Number of Visits 20    Date for PT Re-Evaluation 07/02/21    Authorization Type [employer] MCR    PT Start Time 0930    PT Stop Time 1010    PT Time Calculation (min) 40 min    Activity Tolerance Patient tolerated treatment well    Behavior During Therapy WFL for tasks assessed/performed             Past Medical History:  Diagnosis Date   Allergy    Arthritis    Cancer ([place])    3 skin cancers removed. 2 on legs, 1 from face   Cataract    bil   Complication of anesthesia    slow to wake up   Dysrhythmia    PAC's    GERD (gastroesophageal reflux disease)    Osteoporosis    PONV (postoperative nausea and vomiting)    Past Surgical History:  Procedure Laterality Date   APPENDECTOMY  early 80's   BREAST SURGERY Right 2003   palpiloma   HERNIA REPAIR     INGUINAL HERNIA REPAIR Left 08/18/2012   Procedure: LEFT INGUINAL HERNIA REPAIR;  Surgeon: Haywood Lasso, MD;  Location: [facility];  Service: General;  Laterality: Left;   PARATHYROIDECTOMY  [id number]   [place]  early 80's   Patient Active Problem List   Diagnosis Date Noted   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   Heart palpitations 06/21/2020   PAC (premature atrial contraction) 06/21/2020   Non-restorative sleep 06/21/2020   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   Murmur 10/18/2019   Elevated blood pressure reading without diagnosis of hypertension 10/18/2019   Closed compression fracture of body of lumbar vertebra ([place]) 09/02/2017   Bilateral renal cysts 09/02/2017   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   Radiculopathy, lumbar region 03/18/2017   Degenerative tear  of meniscus, left 02/13/2017   Piriformis syndrome of right side 02/04/2017   Degenerative tear of medial meniscus of right knee 07/26/2016   Left knee pain 07/10/2016   Chronic heel pain, left 03/28/2016   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 She states that her BP was very high all day long day before yesterday and felt awful. Yesterday it went down and it is back to normal. She reports "116/70 something." Pt states that hip pain is not as bad as the knee. She states that she "loves the pool." It feels much better in there.    PAIN:  Are you having pain? No NPRS scale: 2/10 Pain location: L lateral hip, L ant knee Pain orientation: Left  PAIN TYPE: aching and sharp  Pain description: intermittent  Aggravating factors: moving, stairs, squatting,  Relieving factors: resting  PRECAUTIONS: None  WEIGHT BEARING RESTRICTIONS No  FALLS:  Has patient fallen in last 6 months? No, Number of falls: 0  LIVING ENVIRONMENT: Lives with: lives with their family and lives with their spouse (caregiver to husband who is blind) Lives in: House/apartment Stairs: Yes;   OCCUPATION: retired Environmental consultant  PLOF: Independent  PATIENT GOALS : Pt wants to be able to walk for exercise, social outings, and mental health relief.    OBJECTIVE:   DIAGNOSTIC FINDINGS:   L knee US Impression:  Degenerative meniscus and joint space loss with moderate effusion of Left  knee   IMPRESSION: New mild compression deformity of the superior endplate of vertebral body L1, concerning for acute fracture. No definite retropulsion seen. PATIENT SURVEYS:  LEFS 52/80 points (or 65.00%)  BP in seated: L arm 147/82 No acute distress, SOB, or feeling ill GAIT: Distance walked: 78ft Assistive device utilized: None Level of assistance: Complete Independence Comments: antalgic pattern, decreased hip extension, bilat Trendelenburg   Stairs: ascend reciprocal with R UE and lean, step to with L LE leading descend with p!   TODAY'S TREATMENT:  2/16 STM: R hip deep hip rotators, gluteals  Supine fig 4 stretch 30s  Bridge with blue TB at knees 2x10 Prone quad stretch 30s 3x Sidestepping YTB at ankle 2x 49ft LAQ RTB 2x10   Previous: Pt seen for aquatic therapy today.  Treatment took place in water 3.25-4.8 ft in depth at the [facility] pool. Temp of water was 87.  Pt entered/exited the pool via stairs step to pattern with bilat rail and CGA.  Reviewed current function, pain levels, response to prior Rx, and HEP compliance.    Introduction to setting. Pt submerged to varying depths while education on properties of water and benefits of aquatic therapy. Walking supported by yellow noodle 4 widths forward, back, and side stepping.  Vc and SBA.  Exercises Submerged in 4 ft standing holding to wall: high   knee marching; add/abd, hip extension 2x10 reps. -DF and PF x 20  Seated: stretching hamstrings, gastroc 2x20 seconds.  -flutter kicking 2 x 10; add/abd x 10; cycling x10.   Cues throughout for abdominal bracing improving core strength and control   Pt requires buoyancy for support and to offload joints with strengthening exercises. Viscosity of the water is needed for resistance of strengthening; water current perturbations provides challenge to standing balance unsupported, requiring increased core activation.      PATIENT EDUCATION:   Education details: anatomy, exercise progression, DOMS expectations, muscle firing,  envelope of function, HEP, POC  Person educated: Patient Education method: Explanation, Demonstration, Tactile cues, Verbal cues, and Handouts Education comprehension: verbalized understanding, returned demonstration, verbal cues required, and tactile cues required   HOME EXERCISE PROGRAM: Access Code: [email or web address] URL: [email or web address] Date: 04/19/2021 Prepared by: Daleen Bo  Exercises Prone Quadriceps Stretch with Strap - 2 x daily - 7 x weekly - 1 sets - 3 reps - 30 hold Supine Bridge with Resistance Band - 2 x daily - 7 x weekly - 2 sets - 10 reps Seated Figure 4 Piriformis Stretch - 2 x daily - 7 x weekly - 1 sets - 3 reps - 30 hold Sitting Knee Extension with Resistance - 1 x daily - 7 x weekly - 3 sets - 10 reps Side Stepping with Resistance at Ankles - 1 x daily - 7 x weekly - 3 sets - 10  reps   ASSESSMENT:  CLINICAL IMPRESSION: Pt with improved gait mechanics and report of decreased hip and knee stiffness following manual and exercise at today's session. Pt able to tolerate deep ischemic pressure to lateral hip today without increased pain and had palpable deep twitch response during STM. Pt responded well to quad stretching as well as progression of HEP. Pt had STS changed to LAQ due to quad strength deficits causing pain with eccentric lowering. HEP and exercise bands provided for progression of independent exercise. Plan to continue with aquatic environment to progress L LE. ROM and strength.   Objective impairments include Abnormal gait, decreased activity tolerance, decreased endurance, decreased mobility, difficulty walking, decreased ROM, decreased strength, hypomobility, increased muscle spasms, impaired flexibility, improper body mechanics, postural dysfunction, and pain. These impairments are limiting patient from cleaning, community activity, driving, meal prep, laundry,  yard work, shopping, and exercise and caregiver duties. Personal factors including Age, Fitness, Past/current experiences, Time since onset of injury/illness/exacerbation, and 1-2 comorbidities:  are also affecting patient's functional outcome. Patient will benefit from skilled PT to address above impairments and improve overall function.  REHAB POTENTIAL: Good  CLINICAL DECISION MAKING: Stable/uncomplicated  EVALUATION COMPLEXITY: Low   GOALS:   SHORT TERM GOALS:  STG Name Target Date Goal status  1 Pt will become independent with HEP in order to demonstrate synthesis of PT education.  Baseline:  05/15/2021 INITIAL  2 Pt will report at least 2 pt reduction on NPRS scale for pain in order to demonstrate functional improvement with household activity, self care, and ADL.  Baseline:  05/15/2021 INITIAL  3 Pt will be able to demonstrate STS without UE in order to demonstrate functional improvement in LE function for self-care and house hold duties.  Baseline: 05/15/2021 INITIAL  4 Pt will be able to demonstrate/report ability to walk >15 mins without pain in order to demonstrate functional improvement and tolerance to exercise and community mobility.  Baseline: 05/15/2021 INITIAL   LONG TERM GOALS:   LTG Name Target Date Goal status  1 Pt  will become independent with final HEP in order to demonstrate synthesis of PT education.  Baseline: 06/26/2021 INITIAL  2 Pt will have an at least 18 pt improvement in LEFS measure in order to demonstrate MCID improvement in daily function.  06/26/2021 INITIAL  3 Pt will be able to perform 5XSTS in under 12s  in order to demonstrate functional improvement above the cut off score for adults.  Baseline: 06/26/2021 INITIAL  4 Pt will be able to demonstrate reciprocal stair management in order to demonstrate functional improvement in LE function for self-care and house hold mobility.  Baseline: 06/26/2021 INITIAL   PLAN: PT FREQUENCY: 1-2x/week  PT  DURATION: 12 weeks (likely D/C by 10)   PLANNED INTERVENTIONS: Therapeutic exercises, Therapeutic activity, Neuro Muscular re-education, Balance training, Gait training, Patient/Family education, Joint mobilization, Stair training, Prosthetic training, DME instructions, Aquatic Therapy, Dry Needling, Electrical stimulation, Spinal mobilization, Cryotherapy, Moist heat, Taping, Vasopneumatic device, Traction, Ultrasound, Ionotophoresis 4mg /ml Dexamethasone, and Manual therapy  PLAN FOR NEXT SESSION: review HEP, hip ABD strength, standing balance, knee extensor strength   Daleen Bo PT, DPT 04/19/21 10:33 AM

## 2021-04-26 ENCOUNTER — Ambulatory Visit (HOSPITAL_BASED_OUTPATIENT_CLINIC_OR_DEPARTMENT_OTHER): Payer: Medicare PPO | Admitting: Physical Therapy

## 2021-04-26 ENCOUNTER — Encounter (HOSPITAL_BASED_OUTPATIENT_CLINIC_OR_DEPARTMENT_OTHER): Payer: Self-pay | Admitting: Physical Therapy

## 2021-04-26 ENCOUNTER — Other Ambulatory Visit: Payer: Self-pay

## 2021-04-26 DIAGNOSIS — M25552 Pain in left hip: Secondary | ICD-10-CM | POA: Diagnosis not present

## 2021-04-26 DIAGNOSIS — M25562 Pain in left knee: Secondary | ICD-10-CM

## 2021-04-26 DIAGNOSIS — G8929 Other chronic pain: Secondary | ICD-10-CM

## 2021-04-26 DIAGNOSIS — M25652 Stiffness of left hip, not elsewhere classified: Secondary | ICD-10-CM

## 2021-04-26 DIAGNOSIS — M6281 Muscle weakness (generalized): Secondary | ICD-10-CM

## 2021-04-26 DIAGNOSIS — R262 Difficulty in walking, not elsewhere classified: Secondary | ICD-10-CM

## 2021-04-26 NOTE — Therapy (Signed)
OUTPATIENT PHYSICAL THERAPY TREATMENT NOTE   Patient Name: Hailey Fox MRN: [id number] DOB:11/04/1940, 81 y.o., female Today's Date: 04/26/2021  PCP: Chesley Noon, MD REFERRING PROVIDER: Chesley Noon, MD   PT End of Session - 04/26/21 1123     Visit Number 4    Number of Visits 20    Date for PT Re-Evaluation 07/02/21    Authorization Type [employer] MCR    PT Start Time 1100    PT Stop Time 1145    PT Time Calculation (min) 45 min    Activity Tolerance Patient tolerated treatment well    Behavior During Therapy WFL for tasks assessed/performed              Past Medical History:  Diagnosis Date   Allergy    Arthritis    Cancer ([place])    3 skin cancers removed. 2 on legs, 1 from face   Cataract    bil   Complication of anesthesia    slow to wake up   Dysrhythmia    PAC's    GERD (gastroesophageal reflux disease)    Osteoporosis    PONV (postoperative nausea and vomiting)    Past Surgical History:  Procedure Laterality Date   APPENDECTOMY  early 80's   BREAST SURGERY Right 2003   palpiloma   HERNIA REPAIR     INGUINAL HERNIA REPAIR Left 08/18/2012   Procedure: LEFT INGUINAL HERNIA REPAIR;  Surgeon: Haywood Lasso, MD;  Location: [facility];  Service: General;  Laterality: Left;   PARATHYROIDECTOMY  [id number]   [place]  early 80's   Patient Active Problem List   Diagnosis Date Noted   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   Heart palpitations 06/21/2020   PAC (premature atrial contraction) 06/21/2020   Non-restorative sleep 06/21/2020   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   Murmur 10/18/2019   Elevated blood pressure reading without diagnosis of hypertension 10/18/2019   Closed compression fracture of body of lumbar vertebra ([place]) 09/02/2017   Bilateral renal cysts 09/02/2017   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   Radiculopathy, lumbar region 03/18/2017   Degenerative tear  of meniscus, left 02/13/2017   Piriformis syndrome of right side 02/04/2017   Degenerative tear of medial meniscus of right knee 07/26/2016   Left knee pain 07/10/2016   Chronic heel pain, left 03/28/2016   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 She was able to get up and down stairs normally prior to having her recent knee injection.    PAIN:  Are you having pain? No NPRS scale: 0/10 Pain location: L lateral hip, L ant knee Pain orientation: Left  PAIN TYPE: aching and sharp Pain description: intermittent  Aggravating factors: moving, stairs, squatting,  Relieving factors: resting  PRECAUTIONS: None  WEIGHT BEARING RESTRICTIONS No  FALLS:  Has patient fallen in last 6 months? No, Number of falls: 0  LIVING ENVIRONMENT: Lives  with: lives with their family and lives with their spouse (caregiver to husband who is blind) Lives in: House/apartment Stairs: Yes;   OCCUPATION: retired Environmental consultant  PLOF: Independent  PATIENT GOALS : Pt wants to be able to walk for exercise, social outings, and mental health relief.    OBJECTIVE:   DIAGNOSTIC FINDINGS:   L knee US Impression:  Degenerative meniscus and joint space loss with moderate effusion of Left knee   IMPRESSION: New mild compression deformity of the superior endplate of vertebral body L1, concerning for acute fracture. No definite retropulsion seen. PATIENT SURVEYS:  LEFS 52/80 points (or  65.00%)    TODAY'S TREATMENT: 2/23 Pt seen for aquatic therapy today.  Treatment took place in water 3.25-4.8 ft in depth at the [facility] pool. Temp of water was 88.  Pt entered/exited the pool via stairs step to pattern with bilat rail and CGA.   Walking supported by yellow noodle 4 widths forward, back, and side stepping 3x laps  Exercises Submerged in 4 ft standing holding to wall: high knee marching; add/abd, hip extension 20x reps, HS curl. -standing HR/TR 20x  -stair step up 2x10  -mini squat 2x10 -tandem balance 20s 3x each side  Seated:  hip ABD ADD 2x20 Flutter kicking 2x20   Pt requires buoyancy for support and to offload joints with strengthening exercises. Viscosity of the water is needed for resistance of strengthening; water current perturbations provides challenge to standing balance unsupported, requiring increased core activation.  2/16 STM: R hip deep hip rotators, gluteals  Supine fig 4 stretch 30s  Bridge with blue TB at knees 2x10 Prone quad stretch 30s 3x Sidestepping YTB at ankle 2x 37ft LAQ RTB 2x10   Previous: Pt seen for aquatic therapy today.  Treatment took place in water 3.25-4.8 ft in depth at the [facility] pool. Temp of water was 87.  Pt entered/exited the pool via stairs step to pattern with bilat rail and CGA.  Reviewed current function, pain levels, response to prior Rx, and HEP compliance.    Introduction to setting. Pt submerged to varying depths while education on properties of water and benefits of aquatic therapy. Walking supported by yellow noodle 4 widths forward, back, and side stepping.  Vc and SBA.  Exercises Submerged in 4 ft standing holding to wall: high knee marching; add/abd, hip extension 2x10 reps. -DF and PF x 20  Seated: stretching hamstrings, gastroc 2x20 seconds.  -flutter kicking 2 x 10; add/abd x 10; cycling x10.   Cues throughout for abdominal bracing improving core strength and  control   Pt requires buoyancy for support and to offload joints with strengthening exercises. Viscosity of the water is needed for resistance of strengthening; water current perturbations provides challenge to standing balance unsupported, requiring increased core activation.      PATIENT EDUCATION:  Education details: anatomy, exercise progression, DOMS expectations, muscle firing,  envelope of function, HEP, POC  Person educated: Patient Education method: Explanation, Demonstration, Tactile cues, Verbal cues, and Handouts Education comprehension: verbalized understanding, returned demonstration, verbal cues required, and tactile cues required   HOME EXERCISE PROGRAM: Access Code: [email or web address] URL: [email or web address] Date: 04/19/2021 Prepared by: Daleen Bo  Exercises Prone Quadriceps Stretch with Strap - 2 x daily - 7 x weekly - 1 sets - 3 reps - 30 hold Supine Bridge with Resistance Band - 2 x daily - 7 x weekly - 2 sets - 10 reps Seated Figure 4 Piriformis Stretch - 2 x daily - 7 x  weekly - 1 sets - 3 reps - 30 hold Sitting Knee Extension with Resistance - 1 x daily - 7 x weekly - 3 sets - 10 reps Side Stepping with Resistance at Ankles - 1 x daily - 7 x weekly - 3 sets - 10 reps   ASSESSMENT:  CLINICAL IMPRESSION: Pt with good tolerance to aquatic therapy session today and able to progress to decreased need for UE assist in addition to increased volume of repetition. Pt with recent steroid injection that is aiding to reduction of L knee inflammation. Pt without pain during session and also had good response to lateral hip and SL loading. Plan to continue with aquatic L hip and knee progression.  Objective impairments include Abnormal gait, decreased activity tolerance, decreased endurance, decreased mobility, difficulty walking, decreased ROM, decreased strength, hypomobility, increased muscle spasms, impaired flexibility, improper body mechanics, postural  dysfunction, and pain. These impairments are limiting patient from cleaning, community activity, driving, meal prep, laundry, yard work, shopping, and exercise and caregiver duties. Personal factors including Age, Fitness, Past/current experiences, Time since onset of injury/illness/exacerbation, and 1-2 comorbidities:  are also affecting patient's functional outcome. Patient will benefit from skilled PT to address above impairments and improve overall function.  REHAB POTENTIAL: Good  CLINICAL DECISION MAKING: Stable/uncomplicated  EVALUATION COMPLEXITY: Low   GOALS:   SHORT TERM GOALS:  STG Name Target Date Goal status  1 Pt will become independent with HEP in order to demonstrate synthesis of PT education.  Baseline:  05/15/2021 INITIAL  2 Pt will report at least 2 pt reduction on NPRS scale for pain in order to demonstrate functional improvement with household activity, self care, and ADL.  Baseline:  05/15/2021 INITIAL  3 Pt will be able to demonstrate STS without UE in order to demonstrate functional improvement in LE function for self-care and house hold duties.  Baseline: 05/15/2021 INITIAL  4 Pt will be able to demonstrate/report ability to walk >15 mins without pain in order to demonstrate functional improvement and tolerance to exercise and community mobility.  Baseline: 05/15/2021 INITIAL   LONG TERM GOALS:   LTG Name Target Date Goal status  1 Pt  will become independent with final HEP in order to demonstrate synthesis of PT education.  Baseline: 06/26/2021 INITIAL  2 Pt will have an at least 18 pt improvement in LEFS measure in order to demonstrate MCID improvement in daily function.  06/26/2021 INITIAL  3 Pt will be able to perform 5XSTS in under 12s  in order to demonstrate functional improvement above the cut off score for adults.  Baseline: 06/26/2021 INITIAL  4 Pt will be able to demonstrate reciprocal stair management in order to demonstrate functional improvement  in LE function for self-care and house hold mobility.  Baseline: 06/26/2021 INITIAL   PLAN: PT FREQUENCY: 1-2x/week  PT DURATION: 12 weeks (likely D/C by 10)   PLANNED INTERVENTIONS: Therapeutic exercises, Therapeutic activity, Neuro Muscular re-education, Balance training, Gait training, Patient/Family education, Joint mobilization, Stair training, Prosthetic training, DME instructions, Aquatic Therapy, Dry Needling, Electrical stimulation, Spinal mobilization, Cryotherapy, Moist heat, Taping, Vasopneumatic device, Traction, Ultrasound, Ionotophoresis 4mg /ml Dexamethasone, and Manual therapy  PLAN FOR NEXT SESSION: review HEP, hip ABD strength, standing balance, knee extensor strength   Daleen Bo PT, DPT 04/26/21 12:05 PM

## 2021-04-30 ENCOUNTER — Ambulatory Visit (HOSPITAL_BASED_OUTPATIENT_CLINIC_OR_DEPARTMENT_OTHER): Payer: Medicare PPO | Admitting: Physical Therapy

## 2021-04-30 ENCOUNTER — Encounter (HOSPITAL_BASED_OUTPATIENT_CLINIC_OR_DEPARTMENT_OTHER): Payer: Self-pay | Admitting: Physical Therapy

## 2021-04-30 ENCOUNTER — Other Ambulatory Visit: Payer: Self-pay

## 2021-04-30 DIAGNOSIS — M25552 Pain in left hip: Secondary | ICD-10-CM

## 2021-04-30 DIAGNOSIS — R262 Difficulty in walking, not elsewhere classified: Secondary | ICD-10-CM

## 2021-04-30 DIAGNOSIS — M6281 Muscle weakness (generalized): Secondary | ICD-10-CM

## 2021-04-30 DIAGNOSIS — G8929 Other chronic pain: Secondary | ICD-10-CM

## 2021-04-30 DIAGNOSIS — M25562 Pain in left knee: Secondary | ICD-10-CM

## 2021-04-30 DIAGNOSIS — M25652 Stiffness of left hip, not elsewhere classified: Secondary | ICD-10-CM

## 2021-04-30 NOTE — Therapy (Signed)
OUTPATIENT PHYSICAL THERAPY TREATMENT NOTE   Patient Name: Hailey Fox MRN: [id number] DOB:Apr 21, 1940, 81 y.o., female Today's Date: 04/30/2021  PCP: Chesley Noon, MD REFERRING PROVIDER: Chesley Noon, MD   PT End of Session - 04/30/21 [phone]     Visit Number 5    Number of Visits 20    Date for PT Re-Evaluation 07/02/21    Authorization Type [employer] MCR    PT Start Time 0930    PT Stop Time 1010    PT Time Calculation (min) 40 min    Activity Tolerance Patient tolerated treatment well    Behavior During Therapy [facility] for tasks assessed/performed              Past Medical History:  Diagnosis Date   Allergy    Arthritis    Cancer ([place])    3 skin cancers removed. 2 on legs, 1 from face   Cataract    bil   Complication of anesthesia    slow to wake up   Dysrhythmia    PAC's    GERD (gastroesophageal reflux disease)    Osteoporosis    PONV (postoperative nausea and vomiting)    Past Surgical History:  Procedure Laterality Date   APPENDECTOMY  early 80's   BREAST SURGERY Right 2003   palpiloma   HERNIA REPAIR     INGUINAL HERNIA REPAIR Left 08/18/2012   Procedure: LEFT INGUINAL HERNIA REPAIR;  Surgeon: Haywood Lasso, MD;  Location: 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   Murmur 10/18/2019   Elevated blood pressure reading without diagnosis of hypertension 10/18/2019   Closed compression fracture of body of lumbar vertebra ([place]) 09/02/2017   Bilateral renal cysts 09/02/2017   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   Radiculopathy, lumbar region 03/18/2017   Degenerative tear  of meniscus, left 02/13/2017   Piriformis syndrome of right side 02/04/2017   Degenerative tear of medial meniscus of right knee 07/26/2016   Left knee pain 07/10/2016   Chronic heel pain, left 03/28/2016   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   Multiple allergies 04/11/2015   GERD (gastroesophageal reflux disease) 04/11/2015   Left inguinal hernia 05/26/2012    REFERRING DIAG: Left hip pain Left knee pain Left hip bursitis OA  THERAPY DIAG:  Pain in left hip   Stiffness of left hip, not elsewhere classified   Chronic pain of left knee   Difficulty walking   Muscle weakness (generalized)  PERTINENT HISTORY: History of compression fracture L1, osteoporosis   PRECAUTIONS: None  SUBJECTIVE:  Pt states that she was "tired."  She states she was able to walk about 1-1.5 miles the other day but had some increased L knee and hip discomfort. Pressure on the hip made it feel better.    PAIN:  Are you having pain? No NPRS scale: 0/10 Pain location: L lateral hip, L ant knee Pain orientation: Left  PAIN TYPE: aching and sharp Pain description: intermittent  Aggravating factors: moving, stairs, squatting,  Relieving factors: resting  PRECAUTIONS: None  WEIGHT BEARING RESTRICTIONS No  FALLS:  Has patient fallen in last 6  months? No, Number of falls: 0  LIVING ENVIRONMENT: Lives with: lives with their family and lives with their spouse (caregiver to husband who is blind) Lives in: House/apartment Stairs: Yes;   OCCUPATION: retired Environmental consultant  PLOF: Independent  PATIENT GOALS : Pt wants to be able to walk for exercise, social outings, and mental health relief.    OBJECTIVE:   DIAGNOSTIC FINDINGS:   L knee US Impression:  Degenerative meniscus and joint space loss with moderate effusion of Left knee   IMPRESSION: New mild compression deformity of the superior endplate of vertebral body L1, concerning for acute fracture. No definite retropulsion seen. PATIENT  SURVEYS:  LEFS 52/80 points (or 65.00%)    TODAY'S TREATMENT:  2/27 STM: R hip deep hip rotators, gluteals  Recumbent bike seat 4; Lvl 2 Supine fig 4 stretch 30s  Fig 4 bridge on L 2x10 Step up 3x10 4" box focused on  LAQ 3lbs 10x, 4lbs 2x10    2/23 Pt seen for aquatic therapy today.  Treatment took place in water 3.25-4.8 ft in depth at the [facility] pool. Temp of water was 88.  Pt entered/exited the pool via stairs step to pattern with bilat rail and CGA.   Walking supported by yellow noodle 4 widths forward, back, and side stepping 3x laps  Exercises Submerged in 4 ft standing holding to wall: high knee marching; add/abd, hip extension 20x reps, HS curl. -standing HR/TR 20x  -stair step up 2x10  -mini squat 2x10 -tandem balance 20s 3x each side  Seated:  hip ABD ADD 2x20 Flutter kicking 2x20   Pt requires buoyancy for support and to offload joints with strengthening exercises. Viscosity of the water is needed for resistance of strengthening; water current perturbations provides challenge to standing balance unsupported, requiring increased core activation.  2/16 STM: R hip deep hip rotators, gluteals  Supine fig 4 stretch 30s  Bridge with blue TB at knees 2x10 Prone quad stretch 30s 3x Sidestepping YTB at ankle 2x 85ft LAQ RTB 2x10   PATIENT EDUCATION:  Education details: anatomy, exercise progression, DOMS expectations, muscle firing,  envelope of function, HEP, POC  Person educated: Patient Education method: Explanation, Demonstration, Tactile cues, Verbal cues, and Handouts Education comprehension: verbalized understanding, returned demonstration, verbal cues required, and tactile cues required   HOME EXERCISE PROGRAM: Access Code: [email or web address] URL: [email or web address] Date: 04/30/2021 Prepared by: Daleen Bo  Exercises Prone Quadriceps Stretch with Strap - 2 x daily - 7 x weekly - 1 sets - 3 reps - 30 hold Supine Bridge  with Resistance Band - 2 x daily - 7 x weekly - 2 sets - 10 reps Seated Figure 4 Piriformis Stretch - 2 x daily - 7 x weekly - 1 sets - 3 reps - 30 hold Sitting Knee Extension with Resistance - 1 x daily - 3-4 x weekly - 3 sets - 10 reps Side Stepping with Resistance at Ankles - 1 x daily - 3-4 x weekly - 3 sets - 10 reps   ASSESSMENT:  CLINICAL IMPRESSION: Pt able to progress strengthening exercise at today's session for L hip and knee extensors without increased pain. Pt does require VC and TC for front plane knee alignment as pt prefers to go into dynamic valgus with SL CKC exercise. Plan to continue with aquatic L hip and knee strength progression. Joint mobility and knee ROM symmetrical at today's land visit.   Objective impairments include Abnormal gait, decreased activity tolerance, decreased endurance, decreased mobility, difficulty walking, decreased  ROM, decreased strength, hypomobility, increased muscle spasms, impaired flexibility, improper body mechanics, postural dysfunction, and pain. These impairments are limiting patient from cleaning, community activity, driving, meal prep, laundry, yard work, shopping, and exercise and caregiver duties. Personal factors including Age, Fitness, Past/current experiences, Time since onset of injury/illness/exacerbation, and 1-2 comorbidities:  are also affecting patient's functional outcome. Patient will benefit from skilled PT to address above impairments and improve overall function.  REHAB POTENTIAL: Good  CLINICAL DECISION MAKING: Stable/uncomplicated  EVALUATION COMPLEXITY: Low   GOALS:   SHORT TERM GOALS:  STG Name Target Date Goal status  1 Pt will become independent with HEP in order to demonstrate synthesis of PT education.  Baseline:  05/15/2021 INITIAL  2 Pt will report at least 2 pt reduction on NPRS scale for pain in order to demonstrate functional improvement with household activity, self care, and ADL.  Baseline:  05/15/2021  INITIAL  3 Pt will be able to demonstrate STS without UE in order to demonstrate functional improvement in LE function for self-care and house hold duties.  Baseline: 05/15/2021 INITIAL  4 Pt will be able to demonstrate/report ability to walk >15 mins without pain in order to demonstrate functional improvement and tolerance to exercise and community mobility.  Baseline: 05/15/2021 INITIAL   LONG TERM GOALS:   LTG Name Target Date Goal status  1 Pt  will become independent with final HEP in order to demonstrate synthesis of PT education.  Baseline: 06/26/2021 INITIAL  2 Pt will have an at least 18 pt improvement in LEFS measure in order to demonstrate MCID improvement in daily function.  06/26/2021 INITIAL  3 Pt will be able to perform 5XSTS in under 12s  in order to demonstrate functional improvement above the cut off score for adults.  Baseline: 06/26/2021 INITIAL  4 Pt will be able to demonstrate reciprocal stair management in order to demonstrate functional improvement in LE function for self-care and house hold mobility.  Baseline: 06/26/2021 INITIAL   PLAN: PT FREQUENCY: 1-2x/week  PT DURATION: 12 weeks (likely D/C by 10)   PLANNED INTERVENTIONS: Therapeutic exercises, Therapeutic activity, Neuro Muscular re-education, Balance training, Gait training, Patient/Family education, Joint mobilization, Stair training, Prosthetic training, DME instructions, Aquatic Therapy, Dry Needling, Electrical stimulation, Spinal mobilization, Cryotherapy, Moist heat, Taping, Vasopneumatic device, Traction, Ultrasound, Ionotophoresis 4mg /ml Dexamethasone, and Manual therapy  PLAN FOR NEXT SESSION: L hip ABD strength, standing balance, knee extensor strength   Daleen Bo PT, DPT 04/30/21 10:15 AM

## 2021-05-03 ENCOUNTER — Encounter (HOSPITAL_BASED_OUTPATIENT_CLINIC_OR_DEPARTMENT_OTHER): Payer: Self-pay | Admitting: Physical Therapy

## 2021-05-03 ENCOUNTER — Ambulatory Visit (HOSPITAL_BASED_OUTPATIENT_CLINIC_OR_DEPARTMENT_OTHER): Payer: Medicare PPO | Attending: Sports Medicine | Admitting: Physical Therapy

## 2021-05-03 ENCOUNTER — Other Ambulatory Visit: Payer: Self-pay

## 2021-05-03 DIAGNOSIS — G8929 Other chronic pain: Secondary | ICD-10-CM

## 2021-05-03 DIAGNOSIS — M25652 Stiffness of left hip, not elsewhere classified: Secondary | ICD-10-CM | POA: Insufficient documentation

## 2021-05-03 DIAGNOSIS — R262 Difficulty in walking, not elsewhere classified: Secondary | ICD-10-CM

## 2021-05-03 DIAGNOSIS — M25552 Pain in left hip: Secondary | ICD-10-CM

## 2021-05-03 DIAGNOSIS — M6281 Muscle weakness (generalized): Secondary | ICD-10-CM

## 2021-05-03 DIAGNOSIS — M25562 Pain in left knee: Secondary | ICD-10-CM | POA: Insufficient documentation

## 2021-05-03 NOTE — Therapy (Signed)
OUTPATIENT PHYSICAL THERAPY TREATMENT NOTE   Patient Name: Hailey Fox MRN: [id number] DOB:03-Aug-1940, 81 y.o., female Today's Date: 05/03/2021  PCP: Chesley Noon, MD REFERRING PROVIDER: Chesley Noon, MD   PT End of Session - 05/03/21 1149     Visit Number 6    Number of Visits 20    Date for PT Re-Evaluation 07/02/21    Authorization Type [employer] MCR    Authorization - Visit Number 6    Progress Note Due on Visit 10    PT Start Time 1150    PT Stop Time 2952    PT Time Calculation (min) 45 min    Activity Tolerance Patient tolerated treatment well    Behavior During Therapy WFL for tasks assessed/performed               Past Medical History:  Diagnosis Date   Allergy    Arthritis    Cancer ([place])    3 skin cancers removed. 2 on legs, 1 from face   Cataract    bil   Complication of anesthesia    slow to wake up   Dysrhythmia    PAC's    GERD (gastroesophageal reflux disease)    Osteoporosis    PONV (postoperative nausea and vomiting)    Past Surgical History:  Procedure Laterality Date   APPENDECTOMY  early 80's   BREAST SURGERY Right 2003   palpiloma   HERNIA REPAIR     INGUINAL HERNIA REPAIR Left 08/18/2012   Procedure: LEFT INGUINAL HERNIA REPAIR;  Surgeon: Haywood Lasso, MD;  Location: [facility];  Service: General;  Laterality: Left;   PARATHYROIDECTOMY  [id number]   [place]  early 80's   Patient Active Problem List   Diagnosis Date Noted   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   Heart palpitations 06/21/2020   PAC (premature atrial contraction) 06/21/2020   Non-restorative sleep 06/21/2020   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   Murmur 10/18/2019   Elevated blood pressure reading without diagnosis of hypertension 10/18/2019   Closed compression fracture of body of lumbar vertebra ([place]) 09/02/2017   Bilateral renal cysts 09/02/2017   Rotator cuff tear arthropathy of right shoulder  07/16/2017   Radiculopathy, lumbar region 03/18/2017   Degenerative tear of meniscus, left 02/13/2017   Piriformis syndrome of right side 02/04/2017   Degenerative tear of medial meniscus of right knee 07/26/2016   Left knee pain 07/10/2016   Chronic heel pain, left 03/28/2016   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  It sometimes pops.  Lt knee is improving.  "It's getting better, but isn't wonderful yet."   Not sure she can walk 15 min without pain yet, depends on surface.  She states she is doing HEP, but probably not as often as she should since she cares for husband who is blind.   PAIN:  Are you  having pain? No NPRS scale: 0/10 Pain location: L lateral hip, L ant knee Pain orientation: Left  PAIN TYPE: aching and sharp Pain description: intermittent  Aggravating factors: moving, stairs, squatting,  Relieving factors: resting  PRECAUTIONS: None  WEIGHT BEARING RESTRICTIONS No  FALLS:  Has patient fallen in last 6 months? No, Number of falls: 0  LIVING ENVIRONMENT: Lives with: lives with their family and lives with their spouse (caregiver to husband who is blind) Lives in: House/apartment Stairs: Yes;   OCCUPATION: retired Environmental consultant  PLOF: Independent  PATIENT GOALS : Pt wants to be able to walk for exercise, social outings, and mental health relief.    OBJECTIVE:   DIAGNOSTIC FINDINGS:   L knee US Impression:   Degenerative meniscus and joint space loss with moderate effusion of Left knee   IMPRESSION: New mild compression deformity of the superior endplate of vertebral body L1, concerning for acute fracture. No definite retropulsion seen. PATIENT SURVEYS:  LEFS 52/80 points (or 65.00%)    TODAY'S TREATMENT:  3/2 Pt seen for aquatic therapy today.  Treatment took place in water 3.25-4 ft in depth at the [facility] pool. Temp of water was 91.  Pt entered/exited the pool via stairs step to/through pattern with bilat rail, independently.  Walking supported by yellow noodle 4 widths forward, back, and side stepping 3x laps  Submerged in 4 ft standing holding to wall:  high knee marching  add/abd x 20 reps hip extension 20x reps.  mini squat x 20, cues for even weight (leans L)  Single leg press with L/R, pushing blue noodle to bottom of pool x 10, repeated with hip abdct/knee flexion.   Lt/Rt quad stretch with foot supported by blue noodle x 10sec, then forward leans for hamstring stretch x 10; repeated 3x.  Standing mini split squats, holding blue noodle x 10 each leg  Seated:  Fig 4 hip stretch 20s x 2 each side  Pt requires buoyancy for support and to offload joints with strengthening exercises. Viscosity of the water is needed for resistance of strengthening; water current perturbations provides challenge to standing balance unsupported, requiring increased core activation.  2/27 STM: R hip deep hip rotators, gluteals  Recumbent bike seat 4; Lvl 2 Supine fig 4 stretch 30s  Fig 4 bridge on L 2x10 Step up 3x10 4" box focused on  LAQ 3lbs 10x, 4lbs 2x10    2/23 Pt seen for aquatic therapy today.  Treatment took place in water 3.25-4.8 ft in depth at the [facility] pool. Temp of water was 88.  Pt entered/exited the pool via stairs step to pattern with bilat rail and CGA.   Walking supported by yellow noodle 4 widths forward, back, and side stepping 3x  laps  Exercises Submerged in 4 ft standing holding to wall: high knee marching; add/abd, hip extension 20x reps, HS curl. -standing HR/TR 20x  -stair step up 2x10  -mini squat 2x10 -tandem balance 20s 3x each side  Seated:  hip ABD ADD 2x20 Flutter kicking 2x20   Pt requires buoyancy for support and to offload joints with strengthening exercises. Viscosity of the water is needed for resistance of strengthening; water current perturbations provides challenge to standing balance unsupported, requiring increased core activation.  2/16 STM: R hip deep hip rotators, gluteals  Supine fig 4 stretch 30s  Bridge with blue TB at knees 2x10 Prone quad stretch 30s 3x Sidestepping YTB at ankle 2x 30ft LAQ RTB 2x10   PATIENT EDUCATION:  Education  details: anatomy, exercise progression, DOMS expectations, muscle firing,  envelope of function, HEP, POC  Person educated: Patient Education method: Explanation, Demonstration, Tactile cues, Verbal cues, and Handouts Education comprehension: verbalized understanding, returned demonstration, verbal cues required, and tactile cues required   HOME EXERCISE PROGRAM: Access Code: [email or web address] URL: [email or web address] Date: 04/30/2021 Prepared by: Daleen Bo  Exercises Prone Quadriceps Stretch with Strap - 2 x daily - 7 x weekly - 1 sets - 3 reps - 30 hold Supine Bridge with Resistance Band - 2 x daily - 7 x weekly - 2 sets - 10 reps Seated Figure 4 Piriformis Stretch - 2 x daily - 7 x weekly - 1 sets - 3 reps - 30 hold Sitting Knee Extension with Resistance - 1 x daily - 3-4 x weekly - 3 sets - 10 reps Side Stepping with Resistance at Ankles - 1 x daily - 3-4 x weekly - 3 sets - 10 reps   ASSESSMENT:  CLINICAL IMPRESSION: Pt observed with less weight through RLE with gait and squats in water; given cues for more even weight distribution.  She reported some increased "awareness" in area of Lt glute med/hip rotators with hip  extension exercise today; otherwise tolerated all exercises well without increase in pain.   Limited Lt hip ER noted with attempts as seated piriformis / fig 4 stretch while sitting on bench in pool. May benefit from manual therapy to this area during land appt. Pt making gradual progress towards LTGs.     Objective impairments include Abnormal gait, decreased activity tolerance, decreased endurance, decreased mobility, difficulty walking, decreased ROM, decreased strength, hypomobility, increased muscle spasms, impaired flexibility, improper body mechanics, postural dysfunction, and pain. These impairments are limiting patient from cleaning, community activity, driving, meal prep, laundry, yard work, shopping, and exercise and caregiver duties. Personal factors including Age, Fitness, Past/current experiences, Time since onset of injury/illness/exacerbation, and 1-2 comorbidities:  are also affecting patient's functional outcome. Patient will benefit from skilled PT to address above impairments and improve overall function.  REHAB POTENTIAL: Good  CLINICAL DECISION MAKING: Stable/uncomplicated  EVALUATION COMPLEXITY: Low   GOALS:   SHORT TERM GOALS:  STG Name Target Date Goal status  1 Pt will become independent with HEP in order to demonstrate synthesis of PT education.  Baseline:  05/15/2021 INITIAL  2 Pt will report at least 2 pt reduction on NPRS scale for pain in order to demonstrate functional improvement with household activity, self care, and ADL.  Baseline:  05/15/2021 INITIAL  3 Pt will be able to demonstrate STS without UE in order to demonstrate functional improvement in LE function for self-care and house hold duties.  Baseline: 05/15/2021 INITIAL  4 Pt will be able to demonstrate/report ability to walk >15 mins without pain in order to demonstrate functional improvement and tolerance to exercise and community mobility.  Baseline: 05/15/2021 INITIAL   LONG TERM GOALS:   LTG  Name Target Date Goal status  1 Pt  will become independent with final HEP in order to demonstrate synthesis of PT education.  Baseline: 06/26/2021 INITIAL  2 Pt will have an at least 18 pt improvement in LEFS measure in order to demonstrate MCID improvement in daily function.  06/26/2021 INITIAL  3 Pt will be able to perform 5XSTS in under 12s  in order to demonstrate functional improvement above the cut off score for adults.  Baseline: 06/26/2021 INITIAL  4 Pt will be able to demonstrate reciprocal stair management in order to demonstrate functional improvement in  LE function for self-care and house hold mobility.  Baseline: 06/26/2021 INITIAL   PLAN: PT FREQUENCY: 1-2x/week  PT DURATION: 12 weeks (likely D/C by 10)   PLANNED INTERVENTIONS: Therapeutic exercises, Therapeutic activity, Neuro Muscular re-education, Balance training, Gait training, Patient/Family education, Joint mobilization, Stair training, Prosthetic training, DME instructions, Aquatic Therapy, Dry Needling, Electrical stimulation, Spinal mobilization, Cryotherapy, Moist heat, Taping, Vasopneumatic device, Traction, Ultrasound, Ionotophoresis 4mg /ml Dexamethasone, and Manual therapy  PLAN FOR NEXT SESSION: L hip ABD strength, standing balance, knee extensor strength  Kerin Perna, PTA 05/03/21 12:49 PM

## 2021-05-08 ENCOUNTER — Encounter (HOSPITAL_BASED_OUTPATIENT_CLINIC_OR_DEPARTMENT_OTHER): Payer: Self-pay | Admitting: Physical Therapy

## 2021-05-08 ENCOUNTER — Ambulatory Visit (HOSPITAL_BASED_OUTPATIENT_CLINIC_OR_DEPARTMENT_OTHER): Payer: Medicare PPO | Admitting: Physical Therapy

## 2021-05-08 ENCOUNTER — Other Ambulatory Visit: Payer: Self-pay

## 2021-05-08 DIAGNOSIS — G8929 Other chronic pain: Secondary | ICD-10-CM

## 2021-05-08 DIAGNOSIS — M25552 Pain in left hip: Secondary | ICD-10-CM | POA: Diagnosis not present

## 2021-05-08 DIAGNOSIS — R262 Difficulty in walking, not elsewhere classified: Secondary | ICD-10-CM

## 2021-05-08 DIAGNOSIS — M6281 Muscle weakness (generalized): Secondary | ICD-10-CM

## 2021-05-08 NOTE — Therapy (Signed)
OUTPATIENT PHYSICAL THERAPY TREATMENT NOTE   Patient Name: Hailey Fox MRN: [id number] DOB:12/21/1940, 81 y.o., female Today's Date: 05/08/2021  PCP: Chesley Noon, MD REFERRING PROVIDER: Chesley Noon, MD   PT End of Session - 05/08/21 1449     Visit Number 7    Number of Visits 20    Date for PT Re-Evaluation 07/02/21    Authorization Type [employer] MCR    Authorization - Visit Number 7    Progress Note Due on Visit 10    PT Start Time 8280    PT Stop Time 1528    PT Time Calculation (min) 40 min    Activity Tolerance Patient tolerated treatment well    Behavior During Therapy WFL for tasks assessed/performed                Past Medical History:  Diagnosis Date   Allergy    Arthritis    Cancer ([place])    3 skin cancers removed. 2 on legs, 1 from face   Cataract    bil   Complication of anesthesia    slow to wake up   Dysrhythmia    PAC's    GERD (gastroesophageal reflux disease)    Osteoporosis    PONV (postoperative nausea and vomiting)    Past Surgical History:  Procedure Laterality Date   APPENDECTOMY  early 80's   BREAST SURGERY Right 2003   palpiloma   HERNIA REPAIR     INGUINAL HERNIA REPAIR Left 08/18/2012   Procedure: LEFT INGUINAL HERNIA REPAIR;  Surgeon: Haywood Lasso, MD;  Location: [facility];  Service: General;  Laterality: Left;   PARATHYROIDECTOMY  [id number]   [place]  early 80's   Patient Active Problem List   Diagnosis Date Noted   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   Heart palpitations 06/21/2020   PAC (premature atrial contraction) 06/21/2020   Non-restorative sleep 06/21/2020   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   Murmur 10/18/2019   Elevated blood pressure reading without diagnosis of hypertension 10/18/2019   Closed compression fracture of body of lumbar vertebra ([place]) 09/02/2017   Bilateral renal cysts 09/02/2017   Rotator cuff tear arthropathy of right shoulder  07/16/2017   Radiculopathy, lumbar region 03/18/2017   Degenerative tear of meniscus, left 02/13/2017   Piriformis syndrome of right side 02/04/2017   Degenerative tear of medial meniscus of right knee 07/26/2016   Left knee pain 07/10/2016   Chronic heel pain, left 03/28/2016   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  She continues to have Lt hip pain going down hills; not as bad if the hill is not as steep.   She was tired after last session, but in a good way.  PAIN:  Are you having pain? No NPRS scale: 0/10 Pain location: L lateral hip, L ant knee Pain orientation: Left  PAIN TYPE: aching and sharp Pain description: intermittent  Aggravating factors: moving, stairs, squatting,  Relieving factors: resting  PRECAUTIONS: None  WEIGHT BEARING RESTRICTIONS No  FALLS:  Has patient fallen in last 6 months? No, Number of falls: 0  LIVING ENVIRONMENT: Lives with: lives with their family and lives with their spouse (caregiver to husband who is blind) Lives in: House/apartment Stairs: Yes;   OCCUPATION: retired Environmental consultant  PLOF: Independent  PATIENT GOALS : Pt wants to be able to walk for exercise, social outings, and mental health relief.    OBJECTIVE: All objective findings taken at eval unless otherwise noted.   DIAGNOSTIC FINDINGS:   L knee US Impression:  Degenerative meniscus and joint space loss with moderate effusion of Left knee   IMPRESSION: New  mild compression deformity of the superior endplate of vertebral body L1, concerning for acute fracture. No definite retropulsion seen. PATIENT SURVEYS:  LEFS 52/80 points (or 65.00%)    TODAY'S TREATMENT:  3/7 Pt seen for aquatic therapy today.  Treatment took place in water 3.25-4 ft in depth at the [facility] pool. Temp of water was 97.  Pt entered/exited the pool via stairs step to/through pattern with bilat rail, independently.  Walking supported by yellow noodle 6 widths forward, back, and side stepping 3x laps  Submerged in 3.6 ft standing holding to wall:  Hip flexion with flexed knee to hip ext straight knee x 10 each leg x 2 sets  Hip add/abd x 20 reps, cues to lead with heel and not lift too high.   Curtsy lunges holding yellow noodle x 5 each leg  Holding wall: hip abdct/ knee flexion (hip openers) x 12 each leg  . Lt/Rt quad stretch with foot supported by blue noodle x 20sec, then forward leans for hamstring stretch x 5 Standing mini split squats, holding wall x 10 each leg each leg forward  Seated on bench outside of water:  Fig 4 hip stretch 20s x 2 each side  3/2 Pt seen for aquatic therapy today.  Treatment took place in water 3.25-4 ft in depth at the [facility] pool. Temp of water was 91.  Pt entered/exited the pool via stairs step to/through pattern with bilat rail, independently.  Walking supported by yellow noodle 4 widths forward, back, and side stepping 3x laps  Submerged in 4 ft standing holding to wall:  high knee marching  add/abd x 20 reps hip extension 20x reps.  mini squat x 20, cues for even weight (leans L)  Single leg press with L/R, pushing blue noodle to bottom of pool x 10, repeated with hip abdct/knee flexion.   Lt/Rt quad stretch with foot supported by blue noodle x 10sec, then forward leans for hamstring stretch x 10; repeated 3x.  Standing mini split squats, holding blue noodle x 10 each leg  Seated:  Fig 4 hip  stretch 20s x 2 each side  Pt requires buoyancy for support and to offload joints with strengthening exercises. Viscosity of the water is needed for resistance of strengthening; water current perturbations provides challenge to standing balance unsupported, requiring increased core activation.  2/27 STM: R hip deep hip rotators, gluteals  Recumbent bike seat 4; Lvl 2 Supine fig 4 stretch 30s  Fig 4 bridge on L 2x10 Step up 3x10 4" box focused on  LAQ 3lbs 10x, 4lbs 2x10  2/23 Pt seen for aquatic therapy today.  Treatment took place in water 3.25-4.8 ft in depth at the [facility] pool. Temp of water was 88.  Pt entered/exited the  pool via stairs step to pattern with bilat rail and CGA.  Walking supported by yellow noodle 4 widths forward, back, and side stepping 3x laps  Exercises Submerged in 4 ft standing holding to wall: high knee marching; add/abd, hip extension 20x reps, HS curl. -standing HR/TR 20x  -stair step up 2x10  -mini squat 2x10 -tandem balance 20s 3x each side  Seated:  hip ABD ADD 2x20 Flutter kicking 2x20   Pt requires buoyancy for support and to offload joints with strengthening exercises. Viscosity of the water is needed for resistance of strengthening; water current perturbations provides challenge to standing balance unsupported, requiring increased core activation.  2/16 STM: R hip deep hip rotators, gluteals  Supine fig 4 stretch 30s  Bridge with blue TB at knees 2x10 Prone quad stretch 30s 3x Sidestepping YTB at ankle 2x 59f LAQ RTB 2x10   PATIENT EDUCATION:  Education details: anatomy, exercise progression, DOMS expectations, muscle firing,  envelope of function, HEP, POC  Person educated: Patient Education method: Explanation, Demonstration, Tactile cues, Verbal cues, and Handouts Education comprehension: verbalized understanding, returned demonstration, verbal cues required, and tactile cues required   HOME EXERCISE  PROGRAM: Access Code: [email or web address]: [email or web address] Date: 04/30/2021 Prepared by: ADaleen Bo Exercises Prone Quadriceps Stretch with Strap - 2 x daily - 7 x weekly - 1 sets - 3 reps - 30 hold Supine Bridge with Resistance Band - 2 x daily - 7 x weekly - 2 sets - 10 reps Seated Figure 4 Piriformis Stretch - 2 x daily - 7 x weekly - 1 sets - 3 reps - 30 hold Sitting Knee Extension with Resistance - 1 x daily - 3-4 x weekly - 3 sets - 10 reps Side Stepping with Resistance at Ankles - 1 x daily - 3-4 x weekly - 3 sets - 10 reps   ASSESSMENT:  CLINICAL IMPRESSION: Pt tolerated all exercises well without increase in Lt hip pain. Pt reporting improved symptoms with walking in community. Pt will benefit from manual therapy to this area during land appt. Pt making progress towards LTGs.     Objective impairments include Abnormal gait, decreased activity tolerance, decreased endurance, decreased mobility, difficulty walking, decreased ROM, decreased strength, hypomobility, increased muscle spasms, impaired flexibility, improper body mechanics, postural dysfunction, and pain. These impairments are limiting patient from cleaning, community activity, driving, meal prep, laundry, yard work, shopping, and exercise and caregiver duties. Personal factors including Age, Fitness, Past/current experiences, Time since onset of injury/illness/exacerbation, and 1-2 comorbidities:  are also affecting patient's functional outcome. Patient will benefit from skilled PT to address above impairments and improve overall function.  REHAB POTENTIAL: Good  CLINICAL DECISION MAKING: Stable/uncomplicated  EVALUATION COMPLEXITY: Low   GOALS:   SHORT TERM GOALS:  STG Name Target Date Goal status  1 Pt will become independent with HEP in order to demonstrate synthesis of PT education.  Baseline:  05/15/2021 INITIAL  2 Pt will report at least 2 pt reduction on NPRS scale for pain in order to  demonstrate functional improvement with household activity, self care, and ADL.  Baseline:  05/15/2021 INITIAL  3 Pt will be able to demonstrate STS without UE in order to demonstrate functional improvement in LE function for self-care and house hold duties.  Baseline: 05/15/2021 INITIAL  4 Pt will be able to demonstrate/report ability to walk >15 mins without pain in order to demonstrate functional improvement and tolerance to exercise and community mobility.  Baseline: 05/15/2021 INITIAL   LONG TERM GOALS:  LTG Name Target Date Goal status  1 Pt  will become independent with final HEP in order to demonstrate synthesis of PT education.  Baseline: 06/26/2021 INITIAL  2 Pt will have an at least 18 pt improvement in LEFS measure in order to demonstrate MCID improvement in daily function.  06/26/2021 INITIAL  3 Pt will be able to perform 5XSTS in under 12s  in order to demonstrate functional improvement above the cut off score for adults.  Baseline: 06/26/2021 INITIAL  4 Pt will be able to demonstrate reciprocal stair management in order to demonstrate functional improvement in LE function for self-care and house hold mobility.  Baseline: 06/26/2021 INITIAL   PLAN: PT FREQUENCY: 1-2x/week  PT DURATION: 12 weeks (likely D/C by 10)   PLANNED INTERVENTIONS: Therapeutic exercises, Therapeutic activity, Neuro Muscular re-education, Balance training, Gait training, Patient/Family education, Joint mobilization, Stair training, Prosthetic training, DME instructions, Aquatic Therapy, Dry Needling, Electrical stimulation, Spinal mobilization, Cryotherapy, Moist heat, Taping, Vasopneumatic device, Traction, Ultrasound, Ionotophoresis '4mg'$ /ml Dexamethasone, and Manual therapy  PLAN FOR NEXT SESSION: L hip ABD strength, standing balance

## 2021-05-11 ENCOUNTER — Ambulatory Visit (HOSPITAL_BASED_OUTPATIENT_CLINIC_OR_DEPARTMENT_OTHER): Payer: Medicare PPO | Admitting: Physical Therapy

## 2021-05-11 ENCOUNTER — Other Ambulatory Visit: Payer: Self-pay

## 2021-05-11 ENCOUNTER — Encounter (HOSPITAL_BASED_OUTPATIENT_CLINIC_OR_DEPARTMENT_OTHER): Payer: Self-pay | Admitting: Physical Therapy

## 2021-05-11 DIAGNOSIS — M25552 Pain in left hip: Secondary | ICD-10-CM | POA: Diagnosis not present

## 2021-05-11 DIAGNOSIS — R262 Difficulty in walking, not elsewhere classified: Secondary | ICD-10-CM

## 2021-05-11 DIAGNOSIS — G8929 Other chronic pain: Secondary | ICD-10-CM

## 2021-05-11 DIAGNOSIS — M6281 Muscle weakness (generalized): Secondary | ICD-10-CM

## 2021-05-11 NOTE — Therapy (Signed)
OUTPATIENT PHYSICAL THERAPY TREATMENT NOTE   Patient Name: Hailey Fox MRN: [id number] DOB:Apr 19, 1940, 81 y.o., female Today's Date: 05/11/2021  PCP: Chesley Noon, MD REFERRING PROVIDER: Chesley Noon, MD   PT End of Session - 05/11/21 1306     Visit Number 8    Number of Visits 20    Date for PT Re-Evaluation 07/02/21    Authorization Type [employer] MCR    Authorization - Visit Number 8    Progress Note Due on Visit 10    PT Start Time 1301    PT Stop Time 1344    PT Time Calculation (min) 43 min    Activity Tolerance Patient tolerated treatment well    Behavior During Therapy WFL for tasks assessed/performed                 Past Medical History:  Diagnosis Date   Allergy    Arthritis    Cancer ([place])    3 skin cancers removed. 2 on legs, 1 from face   Cataract    bil   Complication of anesthesia    slow to wake up   Dysrhythmia    PAC's    GERD (gastroesophageal reflux disease)    Osteoporosis    PONV (postoperative nausea and vomiting)    Past Surgical History:  Procedure Laterality Date   APPENDECTOMY  early 80's   BREAST SURGERY Right 2003   palpiloma   HERNIA REPAIR     INGUINAL HERNIA REPAIR Left 08/18/2012   Procedure: LEFT INGUINAL HERNIA REPAIR;  Surgeon: Haywood Lasso, MD;  Location: [facility];  Service: General;  Laterality: Left;   PARATHYROIDECTOMY  [id number]   [place]  early 80's   Patient Active Problem List   Diagnosis Date Noted   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   Heart palpitations 06/21/2020   PAC (premature atrial contraction) 06/21/2020   Non-restorative sleep 06/21/2020   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   Murmur 10/18/2019   Elevated blood pressure reading without diagnosis of hypertension 10/18/2019   Closed compression fracture of body of lumbar vertebra ([place]) 09/02/2017   Bilateral renal cysts 09/02/2017   Rotator cuff tear arthropathy of right shoulder  07/16/2017   Radiculopathy, lumbar region 03/18/2017   Degenerative tear of meniscus, left 02/13/2017   Piriformis syndrome of right side 02/04/2017   Degenerative tear of medial meniscus of right knee 07/26/2016   Left knee pain 07/10/2016   Chronic heel pain, left 03/28/2016   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 It's still enough to know it is there.  PAIN:  Are you having pain? No NPRS scale: 0/10 Pain location: L lateral hip, L ant knee Pain orientation: Left  PAIN TYPE: aching and sharp Pain description: intermittent  Aggravating factors: moving, stairs, squatting,  Relieving factors: resting  PRECAUTIONS: None  WEIGHT BEARING RESTRICTIONS No  FALLS:  Has  patient fallen in last 6 months? No, Number of falls: 0  LIVING ENVIRONMENT: Lives with: lives with their family and lives with their spouse (caregiver to husband who is blind) Lives in: House/apartment Stairs: Yes;   OCCUPATION: retired Environmental consultant  PLOF: Independent  PATIENT GOALS : Pt wants to be able to walk for exercise, social outings, and mental health relief.    OBJECTIVE: All objective findings taken at eval unless otherwise noted.   DIAGNOSTIC FINDINGS:   L knee US Impression:  Degenerative meniscus and joint space loss with moderate effusion of Left knee   IMPRESSION: New mild compression deformity of the superior endplate of vertebral body L1, concerning  for acute fracture. No definite retropulsion seen. PATIENT SURVEYS:  LEFS 52/80 points (or 65.00%)    TODAY'S TREATMENT:  3/10 Pt seen for aquatic therapy today.  Treatment took place in water 3.25-4 ft in depth at the [facility] pool. Temp of water was 96.  Pt entered/exited the pool via stairs step to/through pattern with bilat rail, independently.  Walking without support - forward, back, and side stepping 3x laps each Side step lunge and return to neutral x 10 each side; 2nd set with arm reach added (intermittent UE to steady) with UE on supported on yellow dumbbells: Forward step lunge and return to neutral x 10 each LE SLS with 3 way hip kicks, x 5 reps each leg, 3 sets  Sit to/from stand x 8 (feet on blue step in water), slow and controlled, no UE Rt Step down, Lt retro step up on last step in water with BUE on rails x 10 each   3/7 Pt seen for aquatic therapy today.  Treatment took place in water 3.25-4 ft in depth at the [facility] pool. Temp of water was 97.  Pt entered/exited the pool via stairs step to/through pattern with bilat rail, independently.  Walking supported by yellow noodle 6 widths forward, back, and side stepping 3x laps  Submerged in 3.6 ft standing holding to wall:  Hip flexion with flexed knee to hip ext straight knee x 10 each leg x 2 sets  Hip add/abd x 20 reps, cues to lead with heel and not lift too high.   Curtsy lunges holding yellow noodle x 5 each leg  Holding wall: hip abdct/ knee flexion (hip openers) x 12 each leg  . Lt/Rt quad stretch with foot supported by blue noodle x 20sec, then forward leans for hamstring stretch x 5 Standing mini split squats, holding wall x 10 each leg each leg forward  Seated on bench outside of water:  Fig 4 hip stretch 20s x 2 each side  PATIENT EDUCATION:  Education details:  exercise progression, DOMS expectations. Person educated: Patient Education method: Explanation, Demonstration,  Tactile cues, Verbal cues, and Handouts Education comprehension: verbalized understanding, returned demonstration, verbal cues required, and tactile cues required   HOME EXERCISE PROGRAM: Access Code: [email or web address] URL: [email or web address] Date: 04/30/2021 Prepared by: Daleen Bo  Exercises Prone Quadriceps Stretch with Strap - 2 x daily - 7 x weekly - 1 sets - 3 reps - 30 hold Supine Bridge with Resistance Band - 2 x daily - 7 x weekly - 2 sets - 10 reps Seated Figure 4 Piriformis Stretch - 2 x daily - 7 x weekly - 1 sets - 3 reps - 30 hold Sitting Knee Extension with Resistance - 1 x daily - 3-4 x weekly - 3 sets - 10 reps Side Stepping with Resistance at Ankles -  1 x daily - 3-4 x weekly - 3 sets - 10 reps   ASSESSMENT:  CLINICAL IMPRESSION: Pt often puts more weight into LLE than Rt.  Some cues to bring more awareness of WB into RLE; challenged with Rt weight shifts and Rt SLS (ie: lunges). She tolerated exercises well without increase in pain in hip, however reported UE soreness from use of yellow dumbbells for support.  Pt has met STG #2 and 3.     Objective impairments include Abnormal gait, decreased activity tolerance, decreased endurance, decreased mobility, difficulty walking, decreased ROM, decreased strength, hypomobility, increased muscle spasms, impaired flexibility, improper body mechanics, postural dysfunction, and pain. These impairments are limiting patient from cleaning, community activity, driving, meal prep, laundry, yard work, shopping, and exercise and caregiver duties. Personal factors including Age, Fitness, Past/current experiences, Time since onset of injury/illness/exacerbation, and 1-2 comorbidities:  are also affecting patient's functional outcome. Patient will benefit from skilled PT to address above impairments and improve overall function.  REHAB POTENTIAL: Good  CLINICAL DECISION MAKING: Stable/uncomplicated  EVALUATION COMPLEXITY:  Low   GOALS:   SHORT TERM GOALS:  STG Name Target Date Goal status  1 Pt will become independent with HEP in order to demonstrate synthesis of PT education.  Baseline:  05/15/2021 INITIAL  2 Pt will report at least 2 pt reduction on NPRS scale for pain in order to demonstrate functional improvement with household activity, self care, and ADL.  Baseline:  05/15/2021 Achieved  3 Pt will be able to demonstrate STS without UE in order to demonstrate functional improvement in LE function for self-care and house hold duties.  Baseline: 05/15/2021 Achieved  4 Pt will be able to demonstrate/report ability to walk >15 mins without pain in order to demonstrate functional improvement and tolerance to exercise and community mobility.  Baseline: 05/15/2021 Partially met 05/11/21   LONG TERM GOALS:   LTG Name Target Date Goal status  1 Pt  will become independent with final HEP in order to demonstrate synthesis of PT education.  Baseline: 06/26/2021 INITIAL  2 Pt will have an at least 18 pt improvement in LEFS measure in order to demonstrate MCID improvement in daily function.  06/26/2021 INITIAL  3 Pt will be able to perform 5XSTS in under 12s  in order to demonstrate functional improvement above the cut off score for adults.  Baseline: 06/26/2021 INITIAL  4 Pt will be able to demonstrate reciprocal stair management in order to demonstrate functional improvement in LE function for self-care and house hold mobility.  Baseline: 06/26/2021 INITIAL   PLAN: PT FREQUENCY: 1-2x/week  PT DURATION: 12 weeks (likely D/C by 10)   PLANNED INTERVENTIONS: Therapeutic exercises, Therapeutic activity, Neuro Muscular re-education, Balance training, Gait training, Patient/Family education, Joint mobilization, Stair training, Prosthetic training, DME instructions, Aquatic Therapy, Dry Needling, Electrical stimulation, Spinal mobilization, Cryotherapy, Moist heat, Taping, Vasopneumatic device, Traction, Ultrasound,  Ionotophoresis $RemoveBeforeD'4mg'jCxixCMNrEFHla$ /ml Dexamethasone, and Manual therapy  PLAN FOR NEXT SESSION: L hip ABD strength, standing balance.  Assess goals on 10th visit.   Kerin Perna, PTA 05/11/21 3:14 PM  \

## 2021-05-14 ENCOUNTER — Ambulatory Visit (HOSPITAL_BASED_OUTPATIENT_CLINIC_OR_DEPARTMENT_OTHER): Payer: Medicare PPO | Admitting: Physical Therapy

## 2021-05-14 ENCOUNTER — Other Ambulatory Visit: Payer: Self-pay

## 2021-05-14 ENCOUNTER — Encounter (HOSPITAL_BASED_OUTPATIENT_CLINIC_OR_DEPARTMENT_OTHER): Payer: Self-pay | Admitting: Physical Therapy

## 2021-05-14 DIAGNOSIS — M25552 Pain in left hip: Secondary | ICD-10-CM | POA: Diagnosis not present

## 2021-05-14 DIAGNOSIS — R262 Difficulty in walking, not elsewhere classified: Secondary | ICD-10-CM

## 2021-05-14 DIAGNOSIS — M25652 Stiffness of left hip, not elsewhere classified: Secondary | ICD-10-CM

## 2021-05-14 DIAGNOSIS — G8929 Other chronic pain: Secondary | ICD-10-CM

## 2021-05-14 DIAGNOSIS — M25562 Pain in left knee: Secondary | ICD-10-CM

## 2021-05-14 DIAGNOSIS — M6281 Muscle weakness (generalized): Secondary | ICD-10-CM

## 2021-05-14 NOTE — Therapy (Signed)
OUTPATIENT PHYSICAL THERAPY TREATMENT NOTE   Patient Name: Hailey Fox MRN: [id number] DOB:1940-04-25, 81 y.o., female Today's Date: 05/14/2021  PCP: Eartha Inch, MD REFERRING PROVIDER: Eartha Inch, MD   PT End of Session - 05/14/21 1017     Visit Number 9    Number of Visits 20    Date for PT Re-Evaluation 07/02/21    Authorization Type [employer] MCR    Authorization - Visit Number 8    Progress Note Due on Visit 10    PT Start Time 1010    PT Stop Time 1050    PT Time Calculation (min) 40 min    Activity Tolerance Patient tolerated treatment well    Behavior During Therapy WFL for tasks assessed/performed                  Past Medical History:  Diagnosis Date   Allergy    Arthritis    Cancer ([facility])    3 skin cancers removed. 2 on legs, 1 from face   Cataract    bil   Complication of anesthesia    slow to wake up   Dysrhythmia    PAC's    GERD (gastroesophageal reflux disease)    Osteoporosis    PONV (postoperative nausea and vomiting)    Past Surgical History:  Procedure Laterality Date   APPENDECTOMY  early 80's   BREAST SURGERY Right 2003   palpiloma   HERNIA REPAIR     INGUINAL HERNIA REPAIR Left 08/18/2012   Procedure: LEFT INGUINAL HERNIA REPAIR;  Surgeon: Currie Paris, MD;  Location: [facility] OR;  Service: General;  Laterality: Left;   PARATHYROIDECTOMY  [id number]   TONSILLECTOMY  1945   TUBAL LIGATION  early 80's   Patient Active Problem List   Diagnosis Date Noted   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   Heart palpitations 06/21/2020   PAC (premature atrial contraction) 06/21/2020   Non-restorative sleep 06/21/2020   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   Murmur 10/18/2019   Elevated blood pressure reading without diagnosis of hypertension 10/18/2019   Closed compression fracture of body of lumbar vertebra ([facility]) 09/02/2017   Bilateral renal cysts 09/02/2017   Rotator cuff tear arthropathy of right  shoulder 07/16/2017   Radiculopathy, lumbar region 03/18/2017   Degenerative tear of meniscus, left 02/13/2017   Piriformis syndrome of right side 02/04/2017   Degenerative tear of medial meniscus of right knee 07/26/2016   Left knee pain 07/10/2016   Chronic heel pain, left 03/28/2016   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 She feels she is loading her R side more effectively now and was able to walk 2 miles without issue. PAIN:  Are you having pain? No NPRS scale: 0/10 Pain location: L lateral hip, L ant knee Pain orientation: Left  PAIN TYPE: aching and sharp Pain description: intermittent  Aggravating factors: moving, stairs, squatting,  Relieving factors: resting  PRECAUTIONS: None  WEIGHT BEARING RESTRICTIONS No  FALLS:  Has patient fallen in last 6 months? No, Number of falls: 0  LIVING ENVIRONMENT: Lives with: lives with their family and lives with their spouse (caregiver to husband who is blind) Lives in: House/apartment Stairs: Yes;   OCCUPATION: retired Environmental consultant  PLOF: Independent  PATIENT GOALS : Pt wants to be able to walk for exercise, social outings, and mental health relief.    OBJECTIVE: All objective findings taken at eval unless otherwise noted.   DIAGNOSTIC FINDINGS:   L knee US Impression:  Degenerative meniscus and joint space loss with moderate effusion of Left knee   IMPRESSION: New mild compression deformity of the  superior endplate of vertebral body L1, concerning for acute fracture. No definite retropulsion seen. PATIENT SURVEYS:  LEFS 52/80 points (or 65.00%)  TODAY'S TREATMENT:  3/13 Pt seen for aquatic therapy today.  Treatment took place in water 3.25-4 ft in depth at the [facility] pool. Temp of water was 96.  Pt entered/exited the pool via stairs step to/through pattern with bilat rail, independently.  Walking without support - forward, back, and side stepping 3x laps each Walking intermittently between sets for recovery   Side step lunge and return to neutral x 10 each side; at edge of pool- intermittent UE Forward step lunge and return to neutral x 10 each LE Fwd step up 10x each no UE SLS with 3 way hip kicks, x 5 reps each leg, 3 sets  Sit to/from stand x 8 (feet on blue step in water), slow and controlled, no UE Rt Step down, Lt retro step up on last step in water with BUE on rails 2x10 each SL heel tap on steps 10x each DL HR off step 5s hold at bottom 15x  Pt requires buoyancy for support and to offload joints with strengthening exercises. Viscosity of the water is needed for resistance of strengthening; water current perturbations provides challenge to standing balance unsupported, requiring increased core activation.    3/10 Pt seen for aquatic therapy today.  Treatment took place in water 3.25-4 ft in depth at the [facility] pool. Temp of water was 96.  Pt entered/exited the pool via stairs step to/through pattern with bilat rail, independently.  Walking without support - forward, back, and side stepping 3x laps each Side step lunge and return to neutral x 10 each side; 2nd set with arm reach added (intermittent UE to steady) with UE on supported on yellow dumbbells: Forward step lunge and return to neutral x 10 each LE SLS with 3 way hip kicks, x 5 reps each leg, 3 sets  Sit to/from stand x 8 (feet on blue step in water), slow and controlled, no UE Rt  Step down, Lt retro step up on last step in water with BUE on rails x 10 each   3/7 Pt seen for aquatic therapy today.  Treatment took place in water 3.25-4 ft in depth at the [facility] pool. Temp of water was 97.  Pt entered/exited the pool via stairs step to/through pattern with bilat rail, independently.  Walking supported by yellow noodle 6 widths forward, back, and side stepping 3x laps  Submerged in 3.6 ft standing holding to wall:  Hip flexion with flexed knee to hip ext straight knee x 10 each leg x 2 sets  Hip add/abd x 20 reps, cues to lead with heel and not lift too high.   Curtsy lunges holding yellow noodle x 5 each leg  Holding wall: hip abdct/ knee flexion (hip openers) x 12 each leg  . Lt/Rt quad stretch with foot supported by blue noodle x 20sec, then forward leans for hamstring stretch x 5 Standing mini split squats, holding wall x 10 each leg each leg forward  Seated on bench outside of water:  Fig 4 hip stretch 20s x 2 each side  PATIENT EDUCATION:  Education details:  exercise progression, DOMS expectations. Person educated: Patient Education method: Explanation, Demonstration, Tactile cues, Verbal cues, and Handouts Education comprehension: verbalized understanding, returned demonstration, verbal cues required, and tactile cues required   HOME EXERCISE PROGRAM: Access Code: [email or web address] URL: [email or web address] Date: 04/30/2021 Prepared by: Daleen Bo  Exercises Prone Quadriceps Stretch with Strap - 2 x daily - 7 x weekly - 1 sets - 3 reps - 30 hold Supine Bridge with Resistance Band - 2 x daily - 7 x weekly - 2 sets - 10 reps Seated Figure 4 Piriformis Stretch - 2 x daily - 7 x weekly - 1 sets - 3 reps - 30 hold Sitting Knee Extension with Resistance - 1 x daily - 3-4 x weekly - 3 sets - 10 reps Side Stepping with Resistance at Ankles - 1 x daily - 3-4 x weekly - 3 sets - 10 reps   ASSESSMENT:  CLINICAL IMPRESSION: Pt able to  increase intensity of loading and increase volume with bilat LE strengthening without pain. Pt able to perform exercise today with less UE support but does require curing for hip extension, level pelvis and decreased hip rotation compensation. Pt appears less apprehensive with stair management and SL stepping activity in the aquatic environment. Plan to perform progress note at next session to assess goals and take objective measures.    Objective impairments include Abnormal gait, decreased activity tolerance, decreased endurance, decreased mobility, difficulty walking, decreased ROM, decreased strength, hypomobility, increased muscle spasms, impaired flexibility, improper body mechanics, postural dysfunction, and pain. These impairments are limiting patient from cleaning, community activity, driving, meal prep, laundry, yard work, shopping, and exercise and caregiver duties. Personal factors including Age, Fitness, Past/current experiences, Time since onset of injury/illness/exacerbation, and 1-2 comorbidities:  are also affecting patient's functional outcome. Patient will benefit from skilled PT to address above impairments and improve overall function.  REHAB POTENTIAL: Good  CLINICAL DECISION MAKING: Stable/uncomplicated  EVALUATION COMPLEXITY: Low   GOALS:   SHORT TERM GOALS:  STG Name Target Date Goal status  1 Pt will become independent with HEP in order to demonstrate synthesis of PT education.  Baseline:  05/15/2021 INITIAL  2 Pt will report at least 2 pt reduction on NPRS scale for pain in order to demonstrate functional improvement with household activity, self care, and ADL.  Baseline:  05/15/2021 Achieved  3 Pt will be able to demonstrate STS without UE in order to demonstrate functional improvement in LE function for self-care and house hold duties.  Baseline: 05/15/2021 Achieved  4 Pt will be able to demonstrate/report ability to walk >15 mins without pain in order to  demonstrate functional improvement and tolerance to exercise and community mobility.  Baseline: 05/15/2021 Partially met 05/11/21   LONG TERM GOALS:   LTG Name Target Date Goal status  1 Pt  will become independent with final HEP in order to demonstrate synthesis of PT education.  Baseline: 06/26/2021 INITIAL  2 Pt will have an at least 18 pt improvement in LEFS measure in order to demonstrate MCID improvement in daily function.  06/26/2021 INITIAL  3 Pt will  be able to perform 5XSTS in under 12s  in order to demonstrate functional improvement above the cut off score for adults.  Baseline: 06/26/2021 INITIAL  4 Pt will be able to demonstrate reciprocal stair management in order to demonstrate functional improvement in LE function for self-care and house hold mobility.  Baseline: 06/26/2021 INITIAL   PLAN: PT FREQUENCY: 1-2x/week  PT DURATION: 12 weeks (likely D/C by 10)   PLANNED INTERVENTIONS: Therapeutic exercises, Therapeutic activity, Neuro Muscular re-education, Balance training, Gait training, Patient/Family education, Joint mobilization, Stair training, Prosthetic training, DME instructions, Aquatic Therapy, Dry Needling, Electrical stimulation, Spinal mobilization, Cryotherapy, Moist heat, Taping, Vasopneumatic device, Traction, Ultrasound, Ionotophoresis 4mg /ml Dexamethasone, and Manual therapy  PLAN FOR NEXT SESSION: L hip ABD strength, standing balance.  Assess goals on 10th visit.   Daleen Bo PT, DPT 05/14/21 10:53 AM   \

## 2021-05-16 ENCOUNTER — Encounter (HOSPITAL_BASED_OUTPATIENT_CLINIC_OR_DEPARTMENT_OTHER): Payer: Self-pay | Admitting: Physical Therapy

## 2021-05-16 ENCOUNTER — Other Ambulatory Visit: Payer: Self-pay

## 2021-05-16 ENCOUNTER — Ambulatory Visit (HOSPITAL_BASED_OUTPATIENT_CLINIC_OR_DEPARTMENT_OTHER): Payer: Medicare PPO | Admitting: Physical Therapy

## 2021-05-16 DIAGNOSIS — M25552 Pain in left hip: Secondary | ICD-10-CM

## 2021-05-16 DIAGNOSIS — R262 Difficulty in walking, not elsewhere classified: Secondary | ICD-10-CM

## 2021-05-16 DIAGNOSIS — M25652 Stiffness of left hip, not elsewhere classified: Secondary | ICD-10-CM

## 2021-05-16 DIAGNOSIS — M6281 Muscle weakness (generalized): Secondary | ICD-10-CM

## 2021-05-16 DIAGNOSIS — G8929 Other chronic pain: Secondary | ICD-10-CM

## 2021-05-16 NOTE — Therapy (Signed)
? ?OUTPATIENT PHYSICAL THERAPY PROGRESS NOTE ?Progress Note ? ?Reporting Period 04/03/21 ? to  05/16/21 ? ? ? ?See note below for Objective Data and Assessment of Progress/Goals.  ? ?  ? ? ?Patient Name: Hailey Fox ?MRN: [id number] ?DOB:07-Feb-1941, 81 y.o., female ?Today's Date: 05/16/2021 ? ?PCP: Chesley Noon, MD ?REFERRING PROVIDER: Chesley Noon, MD ? ? PT End of Session - 05/16/21 1345   ? ? Visit Number 10   ? Number of Visits 20   ? Date for PT Re-Evaluation 07/02/21   ? Authorization Type [employer] MCR   ? Authorization - Visit Number 8   ? Progress Note Due on Visit 10   ? PT Start Time 1300   ? PT Stop Time 1340   ? PT Time Calculation (min) 40 min   ? Activity Tolerance Patient tolerated treatment well   ? Behavior During Therapy [facility] for tasks assessed/performed   ? ?  ?  ? ?  ? ? ? ? ? ? ? ? ?Past Medical History:  ?Diagnosis Date  ? Allergy   ? Arthritis   ? Cancer [facility])   ? 3 skin cancers removed. 2 on legs, 1 from face  ? Cataract   ? bil  ? Complication of anesthesia   ? slow to wake up  ? Dysrhythmia   ? PAC's   ? GERD (gastroesophageal reflux disease)   ? Osteoporosis   ? PONV (postoperative nausea and vomiting)   ? ?Past Surgical History:  ?Procedure Laterality Date  ? APPENDECTOMY  early 69's  ? BREAST SURGERY Right 2003  ? palpiloma  ? HERNIA REPAIR    ? INGUINAL HERNIA REPAIR Left 08/18/2012  ? Procedure: LEFT INGUINAL HERNIA REPAIR;  Surgeon: Haywood Lasso, MD;  Location: [facility];  Service: General;  Laterality: Left;  ? PARATHYROIDECTOMY  [id number]  ? TONSILLECTOMY  1945  ? TUBAL LIGATION  early 64's  ? ?Patient Active Problem List  ? Diagnosis Date Noted  ? 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 Heart palpitations 06/21/2020  ? PAC (premature atrial contraction) 06/21/2020  ? 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 Murmur 10/18/2019  ? Elevated blood pressure reading without diagnosis of hypertension 10/18/2019  ? Closed  compression fracture of body of lumbar vertebra ([place]) 09/02/2017  ? Bilateral renal cysts 09/02/2017  ? 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  ? Radiculopathy, lumbar region 03/18/2017  ? Degenerative tear of meniscus, left 02/13/2017  ? Piriformis syndrome of right side 02/04/2017  ? Degenerative tear of medial meniscus of right knee 07/26/2016  ? Left knee pain 07/10/2016  ? Chronic heel pain, left 03/28/2016  ? 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 Multiple allergies 04/11/2015  ? GERD (gastroesophageal reflux disease) 04/11/2015  ? Left inguinal hernia 05/26/2012  ? ? ?REFERRING DIAG: Left hip pain Left knee pain Left hip bursitis OA ? ?THERAPY DIAG:  ?Pain in left hip ?  ?Stiffness of left hip, not elsewhere classified ?  ?Chronic pain of left knee ?  ?Difficulty walking ?  ?Muscle weakness (generalized) ? ?PERTINENT HISTORY: History of compression fracture L1, osteoporosis  ? ?PRECAUTIONS: None ? ?SUBJECTIVE:   ?Pt states that she was really sore after last session but it is all gone today. She was tired but like working out tired. Pt states she has improved greatly with PT. She has had less knee aching at night and is able to walk more compared  to previously.  ? ?PAIN:  ?Are you having pain? No ?NPRS scale: 0/10 ?Pain location: L lateral hip, L ant knee ?Pain orientation: Left  ?PAIN TYPE: aching and sharp ?Pain description: intermittent  ?Aggravating factors: moving, stairs, squatting,  ?Relieving factors: resting ? ?PRECAUTIONS: None ? ?WEIGHT BEARING RESTRICTIONS No ? ?FALLS:  ?Has patient fallen in last 6 months? No, Number of falls: 0 ? ?LIVING ENVIRONMENT: ?Lives with: lives with their family and lives with their spouse (caregiver to husband who is blind) ?Lives in: House/apartment ?Stairs: Yes; ? ? ?OCCUPATION: retired Environmental consultant ? ?PLOF: Independent ? ?PATIENT GOALS : Pt wants to be able to walk for exercise, social outings, and mental health relief.  ? ? ?OBJECTIVE:   ?DIAGNOSTIC FINDINGS:  ? ?L knee US Impression:  Degenerative meniscus and joint space loss with moderate effusion of Left knee ? ? ?IMPRESSION: ?New mild compression deformity of the superior endplate of vertebral ?body L1, concerning for acute fracture. No definite retropulsion ?seen. ?PATIENT SURVEYS:  ?LEFS 52/80 points (or 65.00%) ?Lower Extremity Functional Score: 55 / 80 = 68.8 % ? ? ? ?5XSTS:  10.6s ? ? ?LE AROM/PROM: ?  ?A/PROM Right ?04/03/2021 Left ?04/03/2021 L 3/15  ?Hip flexion WFL 95 110  ?Hip extension WFL 5 10  ?Hip abduction WFL 25 30  ?Hip adduction       ?Hip internal rotation WFL in seated cross and uncross 15 30  ?Hip external rotation WFL in seated cross and uncross 30 45  ? (Blank rows = not tested) ?  ?LE MMT: ?  ?MMT Right ?04/03/2021 Left ?04/03/2021 L 3/15  ?Hip flexion 4+/5 4/5 4+/5  ?Hip extension 4+/5 4/5 4+/5  ?Hip abduction 4+/5 4/5 4+/5  ?Hip adduction 4+/5 4+/5 4+/5  ?Hip internal rotation 4+/5 4/5 4+/5  ?Hip external rotation 4+/5 4/5 4+/5  ? (Blank rows = not tested) ? ? ?TODAY'S TREATMENT: ? ?3/15 ? ?Prone quad stretch 30s 3x ?Seated HS stretch 30s 2x ?Figure 4 bridge 2x10 ?Sidestepping 38f RTB at knees with padding ?Step up 6x (held due to increased soreness)  ?TKE GTB 2x10 ? ? ? ?3/13 ?Pt seen for aquatic therapy today.  Treatment took place in [facility].25-[facility] Temp of water was 96?.  Pt entered/exited the pool via stairs step to/through pattern with bilat rail, independently. ? ?Walking without support - forward, back, and side stepping 3x laps each ?Walking intermittently between sets for recovery  ? ?Side step lunge and return to neutral x 10 each side; at edge of pool- intermittent UE ?Forward step lunge and return to neutral x 10 each LE ?Fwd step up 10x each no UE ?SLS with 3 way hip kicks, x 5 reps each leg, 3 sets  ?Sit to/from stand x 8 (feet on blue step in water), slow and controlled, no UE ?Rt Step down, Lt retro step up on  last step in water with BUE on rails 2x10 each ?SL heel tap on steps 10x each ?DL HR off step 5s hold at bottom 15x ? ?Pt requires buoyancy for support and to offload joints with strengthening exercises. Viscosity of the water is needed for resistance of strengthening; water current perturbations provides challenge to standing balance unsupported, requiring increased core activation. ?  ? ?3/10 ?Pt seen for aquatic therapy today.  Treatment took place in water 3.25-4 [facility] Temp of [facility] was 96?.  Pt entered/exited the pool via stairs  step to/through pattern with bilat rail, independently. ? ?Walking without support - forward, back, and side stepping 3x laps each ?Side step lunge and return to neutral x 10 each side; 2nd set with arm reach added (intermittent UE to steady) ?with UE on supported on yellow dumbbells: ?Forward step lunge and return to neutral x 10 each LE ?SLS with 3 way hip kicks, x 5 reps each leg, 3 sets  ?Sit to/from stand x 8 (feet on blue step in water), slow and controlled, no UE ?Rt Step down, Lt retro step up on last step in water with BUE on rails x 10 each ?  ? ? ?PATIENT EDUCATION:  ?Education details:  exercise progression, DOMS expectations, exam findings, relevant anatomy ?Person educated: Patient ?Education method: Explanation, Demonstration, Tactile cues, Verbal cues, and Handouts ?Education comprehension: verbalized understanding, returned demonstration, verbal cues required, and tactile cues required ? ? ?HOME EXERCISE PROGRAM: ?Access Code: [email or web address] ?URL: [email or web address] ?Date: 04/30/2021 ?Prepared by: Daleen Bo ? ?Exercises ?Prone Quadriceps Stretch with Strap - 2 x daily - 7 x weekly - 1 sets - 3 reps - 30 hold ?Supine Bridge with Resistance Band - 2 x daily - 7 x weekly - 2 sets - 10 reps ?Seated Figure 4 Piriformis Stretch - 2 x daily - 7 x weekly - 1 sets - 3 reps - 30 hold ?Sitting Knee Extension with Resistance - 1 x  daily - 3-4 x weekly - 3 sets - 10 reps ?Side Stepping with Resistance at Ankles - 1 x daily - 3-4 x weekly - 3 sets - 10 reps ? ? ?ASSESSMENT: ? ?CLINICAL IMPRESSION: ?Pt demonstrates objective improvement as well a

## 2021-05-17 ENCOUNTER — Ambulatory Visit (HOSPITAL_BASED_OUTPATIENT_CLINIC_OR_DEPARTMENT_OTHER): Payer: Medicare PPO | Admitting: Physical Therapy

## 2021-05-18 ENCOUNTER — Ambulatory Visit (HOSPITAL_BASED_OUTPATIENT_CLINIC_OR_DEPARTMENT_OTHER): Payer: Medicare PPO | Admitting: Physical Therapy

## 2021-05-21 NOTE — Therapy (Signed)
? ?OUTPATIENT PHYSICAL THERAPY PROGRESS NOTE ?  ? ? ?Patient Name: Hailey Fox ?MRN: [id number] ?DOB:[birth year], 81 y.o., female ?Today's Date: 05/22/2021 ? ?PCP: Chesley Noon, MD ?REFERRING PROVIDER: Chesley Noon, MD ? ? PT End of Session - 05/22/21 1441   ? ? Visit Number 11   ? Number of Visits 20   ? Date for PT Re-Evaluation 07/02/21   ? Authorization Type [employer] MCR   ? PT Start Time 1440   ? PT Stop Time 1525   ? PT Time Calculation (min) 45 min   ? ?  ?  ? ?  ? ? ? ? ? ? ? ? ? ?Past Medical History:  ?Diagnosis Date  ? Allergy   ? Arthritis   ? Cancer [facility])   ? 3 skin cancers removed. 2 on legs, 1 from face  ? Cataract   ? bil  ? Complication of anesthesia   ? slow to wake up  ? Dysrhythmia   ? PAC's   ? GERD (gastroesophageal reflux disease)   ? Osteoporosis   ? PONV (postoperative nausea and vomiting)   ? ?Past Surgical History:  ?Procedure Laterality Date  ? APPENDECTOMY  early 42's  ? BREAST SURGERY Right 2003  ? palpiloma  ? HERNIA REPAIR    ? INGUINAL HERNIA REPAIR Left 08/18/2012  ? Procedure: LEFT INGUINAL HERNIA REPAIR;  Surgeon: Haywood Lasso, MD;  Location: [facility];  Service: General;  Laterality: Left;  ? PARATHYROIDECTOMY  [id number]  ? TONSILLECTOMY  1945  ? TUBAL LIGATION  early 53's  ? ?Patient Active Problem List  ? Diagnosis Date Noted  ? 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 Heart palpitations 06/21/2020  ? PAC (premature atrial contraction) 06/21/2020  ? 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 Murmur 10/18/2019  ? Elevated blood pressure reading without diagnosis of hypertension 10/18/2019  ? Closed compression fracture of body of lumbar vertebra ([place]) 09/02/2017  ? Bilateral renal cysts 09/02/2017  ? 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  ? Radiculopathy, lumbar region 03/18/2017  ? Degenerative tear of meniscus, left 02/13/2017  ? Piriformis syndrome of right side 02/04/2017  ? Degenerative tear of  medial meniscus of right knee 07/26/2016  ? Left knee pain 07/10/2016  ? Chronic heel pain, left 03/28/2016  ? 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 Multiple allergies 04/11/2015  ? GERD (gastroesophageal reflux disease) 04/11/2015  ? Left inguinal hernia 05/26/2012  ? ? ?REFERRING DIAG: Left hip pain Left knee pain Left hip bursitis OA ? ?THERAPY DIAG:  ?Pain in left hip ?  ?Stiffness of left hip, not elsewhere classified ?  ?Chronic pain of left knee ?  ?Difficulty walking ?  ?Muscle weakness (generalized) ? ?PERTINENT HISTORY: History of compression fracture L1, osteoporosis  ? ?PRECAUTIONS: None ? ?SUBJECTIVE:   ?Pt states that she still has some soreness in LLE from last week's sessions. She has been working on a reciprocal pattern (up/down) and states it is less painful now.  ? ?PAIN:  ?Are you having pain? No ?NPRS scale: 2/10 ?Pain location: L lateral hip, L ant knee ?Pain orientation: Left  ?PAIN TYPE: aching and sharp ?Pain description: intermittent  ?Aggravating factors: moving, stairs, squatting,  ?Relieving factors: resting ? ?PRECAUTIONS: None ? ?WEIGHT BEARING RESTRICTIONS No ? ?FALLS:  ?Has patient fallen in last 6 months? No, Number of falls: 0 ? ?LIVING ENVIRONMENT: ?Lives with: lives with their family and lives with  their spouse (caregiver to husband who is blind) ?Lives in: House/apartment ?Stairs: Yes; ? ? ?OCCUPATION: retired Environmental consultant ? ?PLOF: Independent ? ?PATIENT GOALS : Pt wants to be able to walk for exercise, social outings, and mental health relief.  ? ? ?OBJECTIVE:  *All findings taken at [facility] unless otherwise noted.  ? ?DIAGNOSTIC FINDINGS:  ? ?L knee US Impression:  Degenerative meniscus and joint space loss with moderate effusion of Left knee ? ? ?IMPRESSION: ?New mild compression deformity of the superior endplate of vertebral ?body L1, concerning for acute fracture. No definite retropulsion ?seen. ?PATIENT SURVEYS:  ?LEFS 52/80 points (or 65.00%) ?Lower Extremity Functional  Score: 55 / 80 = 68.8 % ? ? ? ?5XSTS:  10.6s ? ? ?LE AROM/PROM: ?  ?A/PROM Right ?04/03/2021 Left ?04/03/2021 L 3/15  ?Hip flexion WFL 95 110  ?Hip extension WFL 5 10  ?Hip abduction WFL 25 30  ?Hip adduction       ?Hip internal rotation WFL in seated cross and uncross 15 30  ?Hip external rotation WFL in seated cross and uncross 30 45  ? (Blank rows = not tested) ?  ?LE MMT: ?  ?MMT Right ?04/03/2021 Left ?04/03/2021 L 3/15  ?Hip flexion 4+/5 4/5 4+/5  ?Hip extension 4+/5 4/5 4+/5  ?Hip abduction 4+/5 4/5 4+/5  ?Hip adduction 4+/5 4+/5 4+/5  ?Hip internal rotation 4+/5 4/5 4+/5  ?Hip external rotation 4+/5 4/5 4+/5  ? (Blank rows = not tested) ? ? ?TODAY'S TREATMENT: ?3/21 ?Pt seen for aquatic therapy today.  Treatment took place in water 3.25-4 ft in depth at the [facility] pool. Temp of water was 92?.  Pt entered/exited the pool via stairs step to/through pattern with bilat rail, independently. ? ?Walking without support - forward, back, and side stepping 3x laps each ?Walking forward/ backward intermittently between sets for recovery  ?Side step lunge and return to neutral x 10 each side; at edge of pool ?Forward step lunge and return to neutral x 10 each LE ?Fwd step up 10x each no UE ?SLS with 3 way hip kicks, x 5 reps each leg, 2 sets - intermittent UE to wall (cannot hold dumbbells - irritates shoulder) ?SL heel tap on steps 12x each (more difficult on LLE) ?DL HR off step 5s hold at bottom 15x ?Split squat (Intermittent UE support) x 12 each - unable to balance with rainbow dumbbells under water ?SLS each leg (16s RLE, 7 sec LE) ?Pt requires buoyancy for support and to offload joints with strengthening exercises. Viscosity of the water is needed for resistance of strengthening; water current perturbations provides challenge to standing balance unsupported, requiring increased core activation. ?  ? ?3/15 ? ?Prone quad stretch 30s 3x ?Seated HS stretch 30s 2x ?Figure 4 bridge 2x10 ?Sidestepping 70f RTB  at knees with padding ?Step up 6x (held due to increased soreness)  ?TKE GTB 2x10 ? ? ?PATIENT EDUCATION:  ?Education details:  exercise progression, DOMS expectations,  ?Person educated: Patient ?Education method: Explanation, Demonstration, Tactile cues, Verbal cues ?Education comprehension: verbalized understanding, returned demonstration, verbal cues required, and tactile cues required ? ? ?HOME EXERCISE PROGRAM: ?Access Code: [email or web address]?URL: [email or web address] ?Date: 04/30/2021 ?Prepared by: ADaleen Bo? ?Exercises ?Prone Quadriceps Stretch with Strap - 2 x daily - 7 x weekly - 1 sets - 3 reps - 30 hold ?Supine Bridge with Resistance Band - 2 x daily - 7 x weekly - 2 sets - 10 reps ?Seated Figure 4 Piriformis Stretch - 2 x  daily - 7 x weekly - 1 sets - 3 reps - 30 hold ?Sitting Knee Extension with Resistance - 1 x daily - 3-4 x weekly - 3 sets - 10 reps ?Side Stepping with Resistance at Ankles - 1 x daily - 3-4 x weekly - 3 sets - 10 reps ? ? ?ASSESSMENT: ? ?CLINICAL IMPRESSION: ?Pt required minor cues to equal step length with gait in water (both forward/ backward).  She had one episode of increased LBP with forward step up of RLE; resolved quickly with change in exercise/ rest.  Continued difficulty with SL stability as well as CKC strength at the hip and knee. Plan to continue with aquatic therapy to address strength deficits that appear to be more irritable on land. Pt has progressed well and is progressing well towards goals. ? ? ?Objective impairments include Abnormal gait, decreased activity tolerance, decreased endurance, decreased mobility, difficulty walking, decreased ROM, decreased strength, hypomobility, increased muscle spasms, impaired flexibility, improper body mechanics, postural dysfunction, and pain. These impairments are limiting patient from cleaning, community activity, driving, meal prep, laundry, yard work, shopping, and exercise and caregiver duties. Personal factors  including Age, Fitness, Past/current experiences, Time since onset of injury/illness/exacerbation, and 1-2 comorbidities:  are also affecting patient's functional outcome. Patient will benefit from skil

## 2021-05-22 ENCOUNTER — Ambulatory Visit (HOSPITAL_BASED_OUTPATIENT_CLINIC_OR_DEPARTMENT_OTHER): Payer: Medicare PPO | Admitting: Physical Therapy

## 2021-05-22 ENCOUNTER — Encounter (HOSPITAL_BASED_OUTPATIENT_CLINIC_OR_DEPARTMENT_OTHER): Payer: Self-pay | Admitting: Physical Therapy

## 2021-05-22 ENCOUNTER — Other Ambulatory Visit: Payer: Self-pay

## 2021-05-22 DIAGNOSIS — M25552 Pain in left hip: Secondary | ICD-10-CM

## 2021-05-22 DIAGNOSIS — R262 Difficulty in walking, not elsewhere classified: Secondary | ICD-10-CM

## 2021-05-22 DIAGNOSIS — M25652 Stiffness of left hip, not elsewhere classified: Secondary | ICD-10-CM

## 2021-05-22 DIAGNOSIS — G8929 Other chronic pain: Secondary | ICD-10-CM

## 2021-05-31 ENCOUNTER — Encounter (HOSPITAL_BASED_OUTPATIENT_CLINIC_OR_DEPARTMENT_OTHER): Payer: Self-pay

## 2021-05-31 ENCOUNTER — Ambulatory Visit (HOSPITAL_BASED_OUTPATIENT_CLINIC_OR_DEPARTMENT_OTHER): Payer: Medicare PPO | Admitting: Physical Therapy

## 2021-06-05 ENCOUNTER — Encounter (HOSPITAL_BASED_OUTPATIENT_CLINIC_OR_DEPARTMENT_OTHER): Payer: Self-pay | Admitting: Physical Therapy

## 2021-06-05 ENCOUNTER — Ambulatory Visit (HOSPITAL_BASED_OUTPATIENT_CLINIC_OR_DEPARTMENT_OTHER): Payer: Medicare PPO | Attending: Sports Medicine | Admitting: Physical Therapy

## 2021-06-05 DIAGNOSIS — R262 Difficulty in walking, not elsewhere classified: Secondary | ICD-10-CM | POA: Insufficient documentation

## 2021-06-05 DIAGNOSIS — M6281 Muscle weakness (generalized): Secondary | ICD-10-CM | POA: Insufficient documentation

## 2021-06-05 DIAGNOSIS — G8929 Other chronic pain: Secondary | ICD-10-CM | POA: Insufficient documentation

## 2021-06-05 DIAGNOSIS — M25552 Pain in left hip: Secondary | ICD-10-CM | POA: Insufficient documentation

## 2021-06-05 DIAGNOSIS — M25562 Pain in left knee: Secondary | ICD-10-CM | POA: Insufficient documentation

## 2021-06-05 DIAGNOSIS — M25652 Stiffness of left hip, not elsewhere classified: Secondary | ICD-10-CM | POA: Insufficient documentation

## 2021-06-05 NOTE — Therapy (Signed)
? ?OUTPATIENT PHYSICAL THERAPY PROGRESS NOTE ?  ? ? ?Patient Name: Hailey Fox ?MRN: [id number] ?DOB:[birth year], 81 y.o., female ?Today's Date: 06/05/2021 ? ?PCP: Chesley Noon, MD ?REFERRING PROVIDER: Chesley Noon, MD ? ? PT End of Session - 06/05/21 0954   ? ? Visit Number 12   ? Number of Visits 20   ? Date for PT Re-Evaluation 07/02/21   ? Authorization Type [employer] MCR   ? Authorization - Visit Number 11   ? Progress Note Due on Visit 20   ? PT Start Time [phone]   ? PT Stop Time 1030   ? PT Time Calculation (min) 40 min   ? ?  ?  ? ?  ? ? ? ? ? ? ? ? ? ?Past Medical History:  ?Diagnosis Date  ? Allergy   ? Arthritis   ? Cancer [facility])   ? 3 skin cancers removed. 2 on legs, 1 from face  ? Cataract   ? bil  ? Complication of anesthesia   ? slow to wake up  ? Dysrhythmia   ? PAC's   ? GERD (gastroesophageal reflux disease)   ? Osteoporosis   ? PONV (postoperative nausea and vomiting)   ? ?Past Surgical History:  ?Procedure Laterality Date  ? APPENDECTOMY  early 41's  ? BREAST SURGERY Right 2003  ? palpiloma  ? HERNIA REPAIR    ? INGUINAL HERNIA REPAIR Left 08/18/2012  ? Procedure: LEFT INGUINAL HERNIA REPAIR;  Surgeon: Haywood Lasso, MD;  Location: [place];  Service: General;  Laterality: Left;  ? PARATHYROIDECTOMY  [id number]  ? TONSILLECTOMY  1945  ? TUBAL LIGATION  early 33's  ? ?Patient Active Problem List  ? Diagnosis Date Noted  ? 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 Heart palpitations 06/21/2020  ? PAC (premature atrial contraction) 06/21/2020  ? 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 Murmur 10/18/2019  ? Elevated blood pressure reading without diagnosis of hypertension 10/18/2019  ? Closed compression fracture of body of lumbar vertebra ([place]) 09/02/2017  ? Bilateral renal cysts 09/02/2017  ? 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  ? Radiculopathy, lumbar region 03/18/2017  ? Degenerative tear of meniscus, left 02/13/2017  ?  Piriformis syndrome of right side 02/04/2017  ? Degenerative tear of medial meniscus of right knee 07/26/2016  ? Left knee pain 07/10/2016  ? Chronic heel pain, left 03/28/2016  ? 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 Multiple allergies 04/11/2015  ? GERD (gastroesophageal reflux disease) 04/11/2015  ? Left inguinal hernia 05/26/2012  ? ? ?REFERRING DIAG: Left hip pain Left knee pain Left hip bursitis OA ? ?THERAPY DIAG:  ?Pain in left hip ?  ?Stiffness of left hip, not elsewhere classified ?  ?Chronic pain of left knee ?  ?Difficulty walking ?  ?Muscle weakness (generalized) ? ?PERTINENT HISTORY: History of compression fracture L1, osteoporosis  ? ?PRECAUTIONS: None ? ?SUBJECTIVE:  Pt reporting climbing stairs descending without pain and ascending reciprocal with UE support only for balance. She has also decreased her use of tylenol past few days to just one vs 2. ? ?PAIN:  ?Are you having pain? No ?NPRS scale: 2/10 ?Pain location: L lateral hip, L ant knee ?Pain orientation: Left  ?PAIN TYPE: aching and sharp ?Pain description: intermittent  ?Aggravating factors: moving, stairs, squatting,  ?Relieving factors: resting ? ?PRECAUTIONS: None ? ?WEIGHT BEARING RESTRICTIONS No ? ?FALLS:  ?Has patient fallen in last 6 months?  No, Number of falls: 0 ? ?LIVING ENVIRONMENT: ?Lives with: lives with their family and lives with their spouse (caregiver to husband who is blind) ?Lives in: House/apartment ?Stairs: Yes; ? ? ?OCCUPATION: retired Environmental consultant ? ?PLOF: Independent ? ?PATIENT GOALS : Pt wants to be able to walk for exercise, social outings, and mental health relief.  ? ? ?OBJECTIVE:  *All findings taken at [facility] unless otherwise noted.  ? ?DIAGNOSTIC FINDINGS:  ? ?L knee US Impression:  Degenerative meniscus and joint space loss with moderate effusion of Left knee ? ? ?IMPRESSION: ?New mild compression deformity of the superior endplate of vertebral ?body L1, concerning for acute fracture. No definite  retropulsion ?seen. ?PATIENT SURVEYS:  ?LEFS 52/80 points (or 65.00%) ?Lower Extremity Functional Score: 55 / 80 = 68.8 % ? ? ? ?5XSTS:  10.6s ? ? ?LE AROM/PROM: ?  ?A/PROM Right ?04/03/2021 Left ?04/03/2021 L 3/15  ?Hip flexion WFL 95 110  ?Hip extension WFL 5 10  ?Hip abduction WFL 25 30  ?Hip adduction       ?Hip internal rotation WFL in seated cross and uncross 15 30  ?Hip external rotation WFL in seated cross and uncross 30 45  ? (Blank rows = not tested) ?  ?LE MMT: ?  ?MMT Right ?04/03/2021 Left ?04/03/2021 L 3/15  ?Hip flexion 4+/5 4/5 4+/5  ?Hip extension 4+/5 4/5 4+/5  ?Hip abduction 4+/5 4/5 4+/5  ?Hip adduction 4+/5 4+/5 4+/5  ?Hip internal rotation 4+/5 4/5 4+/5  ?Hip external rotation 4+/5 4/5 4+/5  ? (Blank rows = not tested) ? ? ?TODAY'S TREATMENT: ? ?Pt seen for aquatic therapy today.  Treatment took place in water 3.25-4 ft in depth at the [facility] pool. Temp of water was 92?.  Pt entered/exited the pool via stairs step to/through pattern with bilat rail, independently. ? ?Walking without support - forward, back, and side stepping 3x laps each ?Walking forward/ backward intermittently between sets for recovery  ?Gastroc stretch 3 x30s bototm step ?Step ups R/L x12. Cues for core stabilization for improved balance. ?Flutter kicking (SLR) 3x25 Cues for execution and speed, increased resistance for strengthening and aerobic capacity. ?Add/abd 2x20 Similar cues as above ?STS with focus on weight shifting, pushing up throughout heel s and immediate standing balance ?Mini squats  (CKC) bottom step cues for ascending using quads and finish with glut squeeze ?UE: horizontal abd/add using 1 foam hand buoys x10 ?Semi tandem then tandem stance leading R/L ue supported with hand buoys.  ?-horizontal add/abd and head movements in above position. ? ? ?Pt requires buoyancy for support and to offload joints with strengthening exercises. Viscosity of the water is needed for resistance of strengthening;  water current perturbations provides challenge to standing balance unsupported, requiring increased core activation. ?  ? ? ?PATIENT EDUCATION:  ?Education details:  exercise progression, DOMS expectations,  ?Person educated: Patient ?Education method: Explanation, Demonstration, Tactile cues, Verbal cues ?Education comprehension: verbalized understanding, returned demonstration, verbal cues required, and tactile cues required ? ? ?HOME EXERCISE PROGRAM: ?Access Code: [email or web address] ?URL: [email or web address] ?Date: 04/30/2021 ?Prepared by: Daleen Bo ? ?Exercises ?Prone Quadriceps Stretch with Strap - 2 x daily - 7 x weekly - 1 sets - 3 reps - 30 hold ?Supine Bridge with Resistance Band - 2 x daily - 7 x weekly - 2 sets - 10 reps ?Seated Figure 4 Piriformis Stretch - 2 x daily - 7 x weekly - 1 sets - 3 reps - 30 hold ?Sitting Knee Extension with Resistance -  1 x daily - 3-4 x weekly - 3 sets - 10 reps ?Side Stepping with Resistance at Ankles - 1 x daily - 3-4 x weekly - 3 sets - 10 reps ? ? ?ASSESSMENT: ? ?CLINICAL IMPRESSION: ?Pt reports 0/10 pain once submerged and warmed up walking. Progress reported with decreased knee pain (0/10 ascending) with stair climbing at home as well decrease overall pain with reports of less need for OTC pain meds. Some  instability with CKC exercises today which improved with repetition. Good balance demonstrated with dynamic tandem stance. Goals ongoing. ? ? ?Objective impairments include Abnormal gait, decreased activity tolerance, decreased endurance, decreased mobility, difficulty walking, decreased ROM, decreased strength, hypomobility, increased muscle spasms, impaired flexibility, improper body mechanics, postural dysfunction, and pain. These impairments are limiting patient from cleaning, community activity, driving, meal prep, laundry, yard work, shopping, and exercise and caregiver duties. Personal factors including Age, Fitness, Past/current experiences, Time since  onset of injury/illness/exacerbation, and 1-2 comorbidities:  are also affecting patient's functional outcome. Patient will benefit from skilled PT to address above impairments and improve overall function. ? ?REHAB

## 2021-06-08 ENCOUNTER — Ambulatory Visit (HOSPITAL_BASED_OUTPATIENT_CLINIC_OR_DEPARTMENT_OTHER): Payer: Medicare PPO | Admitting: Physical Therapy

## 2021-06-08 DIAGNOSIS — M25552 Pain in left hip: Secondary | ICD-10-CM

## 2021-06-08 DIAGNOSIS — G8929 Other chronic pain: Secondary | ICD-10-CM

## 2021-06-08 DIAGNOSIS — R262 Difficulty in walking, not elsewhere classified: Secondary | ICD-10-CM

## 2021-06-08 NOTE — Therapy (Addendum)
? ?OUTPATIENT PHYSICAL THERAPY PROGRESS NOTE ?  ? ? ?Patient Name: Hailey Fox ?MRN: [id number] ?DOB:09-28-1940, 81 y.o., female ?Today's Date: 06/08/2021 ? ?PCP: Chesley Noon, MD ?REFERRING PROVIDER: Chesley Noon, MD ? ? PT End of Session - 06/08/21 1345   ? ? Visit Number 13   ? Number of Visits 20   ? Date for PT Re-Evaluation 07/02/21   ? Authorization Type [employer] MCR   ? Authorization - Visit Number 13   ? Progress Note Due on Visit 20   ? PT Start Time 1300   ? PT Stop Time 6761   ? PT Time Calculation (min) 41 min   ? Activity Tolerance Patient tolerated treatment well   ? Behavior During Therapy [facility] for tasks assessed/performed   ? ?  ?  ? ?  ? ? ? ? ? ? ? ? ? ? ?Past Medical History:  ?Diagnosis Date  ? Allergy   ? Arthritis   ? Cancer [facility])   ? 3 skin cancers removed. 2 on legs, 1 from face  ? Cataract   ? bil  ? Complication of anesthesia   ? slow to wake up  ? Dysrhythmia   ? PAC's   ? GERD (gastroesophageal reflux disease)   ? Osteoporosis   ? PONV (postoperative nausea and vomiting)   ? ?Past Surgical History:  ?Procedure Laterality Date  ? APPENDECTOMY  early 26's  ? BREAST SURGERY Right 2003  ? palpiloma  ? HERNIA REPAIR    ? INGUINAL HERNIA REPAIR Left 08/18/2012  ? Procedure: LEFT INGUINAL HERNIA REPAIR;  Surgeon: Haywood Lasso, MD;  Location: [place];  Service: General;  Laterality: Left;  ? PARATHYROIDECTOMY  [id number]  ? TONSILLECTOMY  1945  ? TUBAL LIGATION  early 77's  ? ?Patient Active Problem List  ? Diagnosis Date Noted  ? 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 Heart palpitations 06/21/2020  ? PAC (premature atrial contraction) 06/21/2020  ? 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 Murmur 10/18/2019  ? Elevated blood pressure reading without diagnosis of hypertension 10/18/2019  ? Closed compression fracture of body of lumbar vertebra ([place]) 09/02/2017  ? Bilateral renal cysts 09/02/2017  ? Rotator cuff tear arthropathy of  right shoulder 07/16/2017  ? Radiculopathy, lumbar region 03/18/2017  ? Degenerative tear of meniscus, left 02/13/2017  ? Piriformis syndrome of right side 02/04/2017  ? Degenerative tear of medial meniscus of right knee 07/26/2016  ? Left knee pain 07/10/2016  ? Chronic heel pain, left 03/28/2016  ? 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 Multiple allergies 04/11/2015  ? GERD (gastroesophageal reflux disease) 04/11/2015  ? Left inguinal hernia 05/26/2012  ? ? ?REFERRING DIAG: Left hip pain Left knee pain Left hip bursitis OA ? ?THERAPY DIAG:  ?Pain in left hip ?  ?Stiffness of left hip, not elsewhere classified ?  ?Chronic pain of left knee ?  ?Difficulty walking ?  ?Muscle weakness (generalized) ? ?PERTINENT HISTORY: History of compression fracture L1, osteoporosis  ? ?PRECAUTIONS: None ? ?SUBJECTIVE:  "I've been gimpy today with my left leg".  She reports that the last session was very helpful. ? ?PAIN:  ?Are you having pain? No ?NPRS scale: 3/10 ?Pain location: lateral hip ?Pain orientation: Left  ?PAIN TYPE: aching and sharp ?Pain description: intermittent  ?Aggravating factors: moving, stairs, squatting,  ?Relieving factors: resting ? ?PRECAUTIONS: None ? ?WEIGHT BEARING RESTRICTIONS No ? ?FALLS:  ?Has patient fallen in last  6 months? No, Number of falls: 0 ? ?LIVING ENVIRONMENT: ?Lives with: lives with their family and lives with their spouse (caregiver to husband who is blind) ?Lives in: House/apartment ?Stairs: Yes; ? ? ?OCCUPATION: retired Environmental consultant ? ?PLOF: Independent ? ?PATIENT GOALS : Pt wants to be able to walk for exercise, social outings, and mental health relief.  ? ? ?OBJECTIVE:  *All findings [facility] at [facility] unless otherwise noted.  ? ?DIAGNOSTIC FINDINGS:  ? ?L knee US Impression:  Degenerative meniscus and joint space loss with moderate effusion of Left knee ? ? ?IMPRESSION: ?New mild compression deformity of the superior endplate of vertebral ?body L1, concerning for acute fracture. No  definite retropulsion ?seen. ?PATIENT SURVEYS:  ?LEFS 52/80 points (or 65.00%) ?Lower Extremity Functional Score: 55 / 80 = 68.8 % ? ? ? ?5XSTS:  10.6s ? ? ?LE AROM/PROM: ?  ?A/PROM Right ?04/03/2021 Left ?04/03/2021 L 3/15  ?Hip flexion WFL 95 110  ?Hip extension WFL 5 10  ?Hip abduction WFL 25 30  ?Hip adduction       ?Hip internal rotation WFL in seated cross and uncross 15 30  ?Hip external rotation WFL in seated cross and uncross 30 45  ? (Blank rows = not tested) ?  ?LE MMT: ?  ?MMT Right ?04/03/2021 Left ?04/03/2021 L 3/15  ?Hip flexion 4+/5 4/5 4+/5  ?Hip extension 4+/5 4/5 4+/5  ?Hip abduction 4+/5 4/5 4+/5  ?Hip adduction 4+/5 4+/5 4+/5  ?Hip internal rotation 4+/5 4/5 4+/5  ?Hip external rotation 4+/5 4/5 4+/5  ? (Blank rows = not tested) ? ? ?TODAY'S TREATMENT: ? ?Pt seen for aquatic therapy today.  Treatment took place in water 3.25-4 ft in depth at the [facility] pool. Temp of water was 92?.  Pt entered/exited the pool via stairs step to/through pattern with bilat rail, independently. ? ?Walking without support - forward, back, and side stepping 3x laps each ?Walking forward/ backward intermittently between sets for recovery  ?Gastroc stretch 3 x30s bototm step ?Forward Step ups R/L x10 with high opp knee; lateral step ups R/L x 10. Cues for core stabilization for improved balance. ?Flutter kicking (SLR) 3x25 - sitting on 4th step, UE support on rails. Cues for execution and speed ?STS with focus on weight shifting, pushing up throughout heels and immediate standing balance x 10, no UE support ?Tandem stance with yellow buoy in RUE x 20 sec x 2 reps - with/without head turns  ?Squats pushing yellow buoy under water x 10 ?Wide stance with attempt at [place]; 2 reps each side with heavy cues ? ?Pt requires buoyancy for support and to offload joints with strengthening exercises. Viscosity of the water is needed for resistance of strengthening; water current perturbations provides challenge to  standing balance unsupported, requiring increased core activation. ?  ? ? ?PATIENT EDUCATION:  ?Education details:  exercise progression, DOMS expectations,  ?Person educated: Patient ?Education method: Explanation, Demonstration, Tactile cues, Verbal cues ?Education comprehension: verbalized understanding, returned demonstration, verbal cues required, and tactile cues required ? ? ?HOME EXERCISE PROGRAM: ?Access Code: [email or web address] ?URL: [email or web address] ?Date: 04/30/2021 ?Prepared by: Daleen Bo ? ?Exercises ?Prone Quadriceps Stretch with Strap - 2 x daily - 7 x weekly - 1 sets - 3 reps - 30 hold ?Supine Bridge with Resistance Band - 2 x daily - 7 x weekly - 2 sets - 10 reps ?Seated Figure 4 Piriformis Stretch - 2 x daily - 7 x weekly - 1 sets - 3 reps -  30 hold ?Sitting Knee Extension with Resistance - 1 x daily - 3-4 x weekly - 3 sets - 10 reps ?Side Stepping with Resistance at Ankles - 1 x daily - 3-4 x weekly - 3 sets - 10 reps ? ? ?ASSESSMENT: ? ?CLINICAL IMPRESSION: ?Pt reported reduction of Lt hip pain once in the water exercising.  Forward Lt step ups produced discomfort in Lt fibularis muscle group; resolved once she changed exercises.  She is progressing well towards remaining goals.  ? ? ?Objective impairments include Abnormal gait, decreased activity tolerance, decreased endurance, decreased mobility, difficulty walking, decreased ROM, decreased strength, hypomobility, increased muscle spasms, impaired flexibility, improper body mechanics, postural dysfunction, and pain. These impairments are limiting patient from cleaning, community activity, driving, meal prep, laundry, yard work, shopping, and exercise and caregiver duties. Personal factors including Age, Fitness, Past/current experiences, Time since onset of injury/illness/exacerbation, and 1-2 comorbidities:  are also affecting patient's functional outcome. Patient will benefit from skilled PT to address above impairments and improve  overall function. ? ?REHAB POTENTIAL: Good ? ?CLINICAL DECISION MAKING: Stable/uncomplicated ? ?EVALUATION COMPLEXITY: Low ? ? ?GOALS: ? ? ?SHORT TERM GOALS: ? ?STG Name Target Date Goal status  ?1 Pt will

## 2021-06-12 ENCOUNTER — Encounter (HOSPITAL_BASED_OUTPATIENT_CLINIC_OR_DEPARTMENT_OTHER): Payer: Self-pay | Admitting: Physical Therapy

## 2021-06-12 ENCOUNTER — Ambulatory Visit (HOSPITAL_BASED_OUTPATIENT_CLINIC_OR_DEPARTMENT_OTHER): Payer: Medicare PPO | Admitting: Physical Therapy

## 2021-06-12 DIAGNOSIS — M25652 Stiffness of left hip, not elsewhere classified: Secondary | ICD-10-CM

## 2021-06-12 DIAGNOSIS — M25552 Pain in left hip: Secondary | ICD-10-CM | POA: Diagnosis not present

## 2021-06-12 DIAGNOSIS — R262 Difficulty in walking, not elsewhere classified: Secondary | ICD-10-CM

## 2021-06-12 DIAGNOSIS — G8929 Other chronic pain: Secondary | ICD-10-CM

## 2021-06-12 DIAGNOSIS — M6281 Muscle weakness (generalized): Secondary | ICD-10-CM

## 2021-06-12 NOTE — Therapy (Signed)
? ?OUTPATIENT PHYSICAL THERAPY PROGRESS NOTE ?  ? ? ?Patient Name: Hailey Fox ?MRN: [id number] ?DOB:1940/09/15, 81 y.o., female ?Today's Date: 06/12/2021 ? ?PCP: Chesley Noon, MD ?REFERRING PROVIDER: Chesley Noon, MD ? ? PT End of Session - 06/12/21 0845   ? ? Visit Number 14   ? Number of Visits 20   ? Date for PT Re-Evaluation 07/02/21   ? Authorization Type [employer] MCR   ? Authorization - Visit Number 14   ? Progress Note Due on Visit 20   ? PT Start Time [phone]   ? PT Stop Time 0925   ? PT Time Calculation (min) 46 min   ? Activity Tolerance Patient tolerated treatment well   ? Behavior During Therapy [facility] for tasks assessed/performed   ? ?  ?  ? ?  ? ? ? ? ? ? ? ? ? ? ?Past Medical History:  ?Diagnosis Date  ? Allergy   ? Arthritis   ? Cancer [facility])   ? 3 skin cancers removed. 2 on legs, 1 from face  ? Cataract   ? bil  ? Complication of anesthesia   ? slow to wake up  ? Dysrhythmia   ? PAC's   ? GERD (gastroesophageal reflux disease)   ? Osteoporosis   ? PONV (postoperative nausea and vomiting)   ? ?Past Surgical History:  ?Procedure Laterality Date  ? APPENDECTOMY  early 37's  ? BREAST SURGERY Right 2003  ? palpiloma  ? HERNIA REPAIR    ? INGUINAL HERNIA REPAIR Left 08/18/2012  ? Procedure: LEFT INGUINAL HERNIA REPAIR;  Surgeon: Haywood Lasso, MD;  Location: [place];  Service: General;  Laterality: Left;  ? PARATHYROIDECTOMY  [id number]  ? TONSILLECTOMY  1945  ? TUBAL LIGATION  early 38's  ? ?Patient Active Problem List  ? Diagnosis Date Noted  ? 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 Heart palpitations 06/21/2020  ? PAC (premature atrial contraction) 06/21/2020  ? 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 Murmur 10/18/2019  ? Elevated blood pressure reading without diagnosis of hypertension 10/18/2019  ? Closed compression fracture of body of lumbar vertebra ([place]) 09/02/2017  ? Bilateral renal cysts 09/02/2017  ? Rotator cuff tear arthropathy of  right shoulder 07/16/2017  ? Radiculopathy, lumbar region 03/18/2017  ? Degenerative tear of meniscus, left 02/13/2017  ? Piriformis syndrome of right side 02/04/2017  ? Degenerative tear of medial meniscus of right knee 07/26/2016  ? Left knee pain 07/10/2016  ? Chronic heel pain, left 03/28/2016  ? 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 Multiple allergies 04/11/2015  ? GERD (gastroesophageal reflux disease) 04/11/2015  ? Left inguinal hernia 05/26/2012  ? ? ?REFERRING DIAG: Left hip pain Left knee pain Left hip bursitis OA ? ?THERAPY DIAG:  ?Pain in left hip ?  ?Stiffness of left hip, not elsewhere classified ?  ?Chronic pain of left knee ?  ?Difficulty walking ?  ?Muscle weakness (generalized) ? ?PERTINENT HISTORY: History of compression fracture L1, osteoporosis  ? ?PRECAUTIONS: None ? ?SUBJECTIVE:  Pt reports she felt pretty good yesterday so she walked for 2 miles.  Her Lt hip hurt a little during, but she reports she woke up with pain and soreness in Lt hip.  She notices that her Lt knee doesn't hurt as bad if she doesn't straighten out her knee as she is doing stairs.  ? ?PAIN:  ?Are you having pain? No ?NPRS scale: 6-7/10 ?Pain location:  lateral hip ?Pain orientation: Left  ?PAIN TYPE: aching and sharp ?Pain description: intermittent  ?Aggravating factors: moving, stairs, squatting,  ?Relieving factors: resting ? ?PRECAUTIONS: None ? ?WEIGHT BEARING RESTRICTIONS No ? ?FALLS:  ?Has patient fallen in last 6 months? No, Number of falls: 0 ? ?LIVING ENVIRONMENT: ?Lives with: lives with their family and lives with their spouse (caregiver to husband who is blind) ?Lives in: House/apartment ?Stairs: Yes; ? ? ?OCCUPATION: retired Environmental consultant ? ?PLOF: Independent ? ?PATIENT GOALS : Pt wants to be able to walk for exercise, social outings, and mental health relief.  ? ? ?OBJECTIVE:  *All findings taken at [facility] unless otherwise noted.  ? ?DIAGNOSTIC FINDINGS:  ? ?L knee US Impression:  Degenerative meniscus and  joint space loss with moderate effusion of Left knee ? ? ?IMPRESSION: ?New mild compression deformity of the superior endplate of vertebral ?body L1, concerning for acute fracture. No definite retropulsion ?seen. ?PATIENT SURVEYS:  ?LEFS 52/80 points (or 65.00%) ?Lower Extremity Functional Score: 55 / 80 = 68.8 % ? ? ? ?5XSTS:  10.6s ? ? ?LE AROM/PROM: ?  ?A/PROM Right ?04/03/2021 Left ?04/03/2021 L 3/15  ?Hip flexion WFL 95 110  ?Hip extension WFL 5 10  ?Hip abduction WFL 25 30  ?Hip adduction       ?Hip internal rotation WFL in seated cross and uncross 15 30  ?Hip external rotation WFL in seated cross and uncross 30 45  ? (Blank rows = not tested) ?  ?LE MMT: ?  ?MMT Right ?04/03/2021 Left ?04/03/2021 L 3/15  ?Hip flexion 4+/5 4/5 4+/5  ?Hip extension 4+/5 4/5 4+/5  ?Hip abduction 4+/5 4/5 4+/5  ?Hip adduction 4+/5 4+/5 4+/5  ?Hip internal rotation 4+/5 4/5 4+/5  ?Hip external rotation 4+/5 4/5 4+/5  ? (Blank rows = not tested) ? ? ?TODAY'S TREATMENT: ? ?Pt seen for aquatic therapy today.  Treatment took place in water 3.25-4 ft in depth at the [facility] pool. Temp of water was 92?.  Pt entered/exited the pool via stairs step to/through pattern with bilat rail, independently. ? ?Prior to entering water:  seated quad stretch, with foot under bench x 20-30 sec x 2 LLE, 1 RLE;  seated glute/piriformis stretch x 20 sec each leg  ?Once in water:  ?Walking without support - forward, back x 3 laps each ?RLE step downs and retro step up with LLE focusing on TKE x 10  ?Side stepping with varied speed and step length x 4 laps ?Holding onto wall - hip circles CW/ CCW x 10 each; high knee march; Tree pose to neutral x 10 each side; hip flex/knee flex crossing midline x 10 each  ?STS from 4th step, with focus on LLE in neutral position (instead of valgus), eccentric lowering, cues for nose over toes and even weight (leans L) x 10, intermittent UE support ?Tandem stance without support x 20 sec x 2 reps - with/without  head turns  ?Walking forward/ backward intermittently between sets for recovery and stretch ?Ai chi Freeing; 3 reps each side (improved balance/coordination) ?Gastroc stretch 1x 30s bottom step ?Standing hamstring stretch x 20s each  ?  ?Pt requires buoyancy for support and to offload joints with strengthening exercises. Viscosity of the water is needed for resistance of strengthening; water current perturbations provides challenge to standing balance unsupported, requiring increased core activation. ?  ? ? ?PATIENT EDUCATION:  ?Education details:  exercise progression, DOMS expectations ?Person educated: Patient ?Education method: Explanation, Demonstration, Tactile cues, Verbal cues ?  Education comprehension: verbalized understanding, returned demonstration, verbal cues required, and tactile cues required ? ? ?HOME EXERCISE PROGRAM: ?Access Code: [email or web address] ?URL: [email or web address] ?Date: 04/30/2021 ?Prepared by: Daleen Bo ? ?Exercises ?Prone Quadriceps Stretch with Strap - 2 x daily - 7 x weekly - 1 sets - 3 reps - 30 hold ?Supine Bridge with Resistance Band - 2 x daily - 7 x weekly - 2 sets - 10 reps ?Seated Figure 4 Piriformis Stretch - 2 x daily - 7 x weekly - 1 sets - 3 reps - 30 hold ?Sitting Knee Extension with Resistance - 1 x daily - 3-4 x weekly - 3 sets - 10 reps ?Side Stepping with Resistance at Ankles - 1 x daily - 3-4 x weekly - 3 sets - 10 reps ? ? ?ASSESSMENT: ? ?CLINICAL IMPRESSION: ?Pt reported reduction of Lt hip pain to 1/10 during session. She has some valgus in LLE with sit to/from stand and forward gait; improved LE positioning with cues. Some cues required for pt to shift more weight to her RLE.  Balance in water is improving.   Encouraged pt to seek out place she will continue her HEP once d/c. She is progressing well towards remaining goals.  ? ? ?Objective impairments include Abnormal gait, decreased activity tolerance, decreased endurance, decreased mobility, difficulty  walking, decreased ROM, decreased strength, hypomobility, increased muscle spasms, impaired flexibility, improper body mechanics, postural dysfunction, and pain. These impairments are limiting patient from cl

## 2021-06-21 ENCOUNTER — Ambulatory Visit (HOSPITAL_BASED_OUTPATIENT_CLINIC_OR_DEPARTMENT_OTHER): Payer: Medicare PPO | Admitting: Physical Therapy

## 2021-06-21 ENCOUNTER — Encounter (HOSPITAL_BASED_OUTPATIENT_CLINIC_OR_DEPARTMENT_OTHER): Payer: Self-pay | Admitting: Physical Therapy

## 2021-06-21 DIAGNOSIS — M25552 Pain in left hip: Secondary | ICD-10-CM | POA: Diagnosis not present

## 2021-06-21 DIAGNOSIS — M6281 Muscle weakness (generalized): Secondary | ICD-10-CM

## 2021-06-21 DIAGNOSIS — M25652 Stiffness of left hip, not elsewhere classified: Secondary | ICD-10-CM

## 2021-06-21 DIAGNOSIS — R262 Difficulty in walking, not elsewhere classified: Secondary | ICD-10-CM

## 2021-06-21 DIAGNOSIS — G8929 Other chronic pain: Secondary | ICD-10-CM

## 2021-06-21 NOTE — Therapy (Signed)
? ?OUTPATIENT PHYSICAL THERAPY PROGRESS NOTE ?  ? ? ?Patient Name: Hailey Fox ?MRN: [id number] ?DOB:March 02, 1941, 81 y.o., female ?Today's Date: 06/21/2021 ? ?PCP: Chesley Noon, MD ?REFERRING PROVIDER: Chesley Noon, MD ? ? PT End of Session - 06/21/21 0815   ? ? Visit Number 15   ? Number of Visits 20   ? Date for PT Re-Evaluation 07/02/21   ? Authorization Type [employer] MCR   ? Authorization - Visit Number 15   ? Progress Note Due on Visit 20   ? PT Start Time 0800   ? PT Stop Time [id number]   ? PT Time Calculation (min) 42 min   ? Activity Tolerance Patient tolerated treatment well   ? Behavior During Therapy [facility] for tasks assessed/performed   ? ?  ?  ? ?  ? ? ? ? ? ? ? ? ? ? ?Past Medical History:  ?Diagnosis Date  ? Allergy   ? Arthritis   ? Cancer [facility])   ? 3 skin cancers removed. 2 on legs, 1 from face  ? Cataract   ? bil  ? Complication of anesthesia   ? slow to wake up  ? Dysrhythmia   ? PAC's   ? GERD (gastroesophageal reflux disease)   ? Osteoporosis   ? PONV (postoperative nausea and vomiting)   ? ?Past Surgical History:  ?Procedure Laterality Date  ? APPENDECTOMY  early 53's  ? BREAST SURGERY Right 2003  ? palpiloma  ? HERNIA REPAIR    ? INGUINAL HERNIA REPAIR Left 08/18/2012  ? Procedure: LEFT INGUINAL HERNIA REPAIR;  Surgeon: Haywood Lasso, MD;  Location: [place];  Service: General;  Laterality: Left;  ? PARATHYROIDECTOMY  [id number]  ? TONSILLECTOMY  1945  ? TUBAL LIGATION  early 77's  ? ?Patient Active Problem List  ? Diagnosis Date Noted  ? 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 Heart palpitations 06/21/2020  ? PAC (premature atrial contraction) 06/21/2020  ? 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 Murmur 10/18/2019  ? Elevated blood pressure reading without diagnosis of hypertension 10/18/2019  ? Closed compression fracture of body of lumbar vertebra ([place]) 09/02/2017  ? Bilateral renal cysts 09/02/2017  ? Rotator cuff tear arthropathy of  right shoulder 07/16/2017  ? Radiculopathy, lumbar region 03/18/2017  ? Degenerative tear of meniscus, left 02/13/2017  ? Piriformis syndrome of right side 02/04/2017  ? Degenerative tear of medial meniscus of right knee 07/26/2016  ? Left knee pain 07/10/2016  ? Chronic heel pain, left 03/28/2016  ? 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 Multiple allergies 04/11/2015  ? GERD (gastroesophageal reflux disease) 04/11/2015  ? Left inguinal hernia 05/26/2012  ? ? ?REFERRING DIAG: Left hip pain Left knee pain Left hip bursitis OA ? ?THERAPY DIAG:  ?Pain in left hip ?  ?Stiffness of left hip, not elsewhere classified ?  ?Chronic pain of left knee ?  ?Difficulty walking ?  ?Muscle weakness (generalized) ? ?PERTINENT HISTORY: History of compression fracture L1, osteoporosis  ? ?PRECAUTIONS: None ? ?SUBJECTIVE:  Pt reports she fell 2 days ago. She was squatting while looking in box and fell backwards, hitting her back and elbow.  She would like to focus on Lt hip strength and balance exercises.  She has had more pain since then with walking hills (in Lt hip) up to 7/10.   ? ?PAIN:  ?Are you having pain? No ?NPRS scale: 0 ?Pain location: lateral hip ?Pain orientation:  Left  ?PAIN TYPE: aching and sharp ?Pain description: intermittent  ?Aggravating factors: moving, stairs, squatting,  ?Relieving factors: resting ? ?PRECAUTIONS: None ? ?WEIGHT BEARING RESTRICTIONS No ? ?FALLS:  ?Has patient fallen in last 6 months? No, Number of falls: 0 ? ?LIVING ENVIRONMENT: ?Lives with: lives with their family and lives with their spouse (caregiver to husband who is blind) ?Lives in: House/apartment ?Stairs: Yes; ? ? ?OCCUPATION: retired Environmental consultant ? ?PLOF: Independent ? ?PATIENT GOALS : Pt wants to be able to walk for exercise, social outings, and mental health relief.  ? ? ?OBJECTIVE:  *All findings taken at [facility] unless otherwise noted.  ? ?DIAGNOSTIC FINDINGS:  ? ?L knee US Impression:  Degenerative meniscus and joint space loss with  moderate effusion of Left knee ? ? ?IMPRESSION: ?New mild compression deformity of the superior endplate of vertebral ?body L1, concerning for acute fracture. No definite retropulsion ?seen. ?PATIENT SURVEYS:  ?LEFS 52/80 points (or 65.00%) ?Lower Extremity Functional Score: 55 / 80 = 68.8 % ? ?5XSTS:  10.6s ? ? ?LE AROM/PROM: ?  ?A/PROM Right ?04/03/2021 Left ?04/03/2021 L 3/15  ?Hip flexion WFL 95 110  ?Hip extension WFL 5 10  ?Hip abduction WFL 25 30  ?Hip adduction       ?Hip internal rotation WFL in seated cross and uncross 15 30  ?Hip external rotation WFL in seated cross and uncross 30 45  ? (Blank rows = not tested) ?  ?LE MMT: ?  ?MMT Right ?04/03/2021 Left ?04/03/2021 L 3/15  ?Hip flexion 4+/5 4/5 4+/5  ?Hip extension 4+/5 4/5 4+/5  ?Hip abduction 4+/5 4/5 4+/5  ?Hip adduction 4+/5 4+/5 4+/5  ?Hip internal rotation 4+/5 4/5 4+/5  ?Hip external rotation 4+/5 4/5 4+/5  ? (Blank rows = not tested) ? ? ?TODAY'S TREATMENT: ? ?Pt seen for aquatic therapy today.  Treatment took place in water 3.25-4 ft in depth at the [employer] pool. Temp of water was 92?.  Pt entered/exited the pool via stairs step to/through pattern with bilat rail, independently. ? ?Once in water:  ?Walking without support - forward, back x 3 laps each, side stepping x 3 laps ?SLS R/L x 15s ?Tandem stance, multiple reps (no support) - horiz head turns too challenging ?Side step with squat R/L - 3 laps ?Side step squat with rainbow dumbbells abdct/ add to the R x 4 reps, Lx 4 reps - challenging ?  High knee marching with rainbow dumbbells under water forward/ backward - challenging ?   ?Standing hamstring stretch x 20s each  ?After exiting water:  seated quad stretch, with foot under bench x 20 sec x 2 LLE, 1 RLE;   ? ?Pt requires buoyancy for support and to offload joints with strengthening exercises. Viscosity of the water is needed for resistance of strengthening; water current perturbations provides challenge to standing balance  unsupported, requiring increased core activation. ?  ? ? ?PATIENT EDUCATION:  ?Education details:  exercise progression, DOMS expectations ?Person educated: Patient ?Education method: Explanation, Demonstration, Tactile cues, Verbal cues ?Education comprehension: verbalized understanding, returned demonstration, verbal cues required, and tactile cues required ? ? ?HOME EXERCISE PROGRAM: ?Access Code: [email or web address] ?URL: [email or web address] ?Date: 04/30/2021 ?Prepared by: Daleen Bo ? ?Exercises ?Prone Quadriceps Stretch with Strap - 2 x daily - 7 x weekly - 1 sets - 3 reps - 30 hold ?Supine Bridge with Resistance Band - 2 x daily - 7 x weekly - 2 sets - 10 reps ?Seated Figure 4 Piriformis Stretch - 2  x daily - 7 x weekly - 1 sets - 3 reps - 30 hold ?Sitting Knee Extension with Resistance - 1 x daily - 3-4 x weekly - 3 sets - 10 reps ?Side Stepping with Resistance at Ankles - 1 x daily - 3-4 x weekly - 3 sets - 10 reps ? ? ?ASSESSMENT: ? ?CLINICAL IMPRESSION: ?Pt demonstrated less balance during session with similar exercises as previous sessions, with decrease in confidence.   Spent more time focused on static and dynamic balance exercises in chest deep water.  No goals met this session.  She remained pain-free throughout the session.  ? ?Objective impairments include Abnormal gait, decreased activity tolerance, decreased endurance, decreased mobility, difficulty walking, decreased ROM, decreased strength, hypomobility, increased muscle spasms, impaired flexibility, improper body mechanics, postural dysfunction, and pain. These impairments are limiting patient from cleaning, community activity, driving, meal prep, laundry, yard work, shopping, and exercise and caregiver duties. Personal factors including Age, Fitness, Past/current experiences, Time since onset of injury/illness/exacerbation, and 1-2 comorbidities:  are also affecting patient's functional outcome. Patient will benefit from skilled PT to  address above impairments and improve overall function. ? ?REHAB POTENTIAL: Good ? ?CLINICAL DECISION MAKING: Stable/uncomplicated ? ?EVALUATION COMPLEXITY: Low ? ? ?GOALS: ? ? ?SHORT TERM GOALS: ? ?STG Name

## 2021-06-28 ENCOUNTER — Ambulatory Visit (HOSPITAL_BASED_OUTPATIENT_CLINIC_OR_DEPARTMENT_OTHER): Payer: Medicare PPO | Admitting: Physical Therapy

## 2021-06-28 DIAGNOSIS — G8929 Other chronic pain: Secondary | ICD-10-CM

## 2021-06-28 DIAGNOSIS — R262 Difficulty in walking, not elsewhere classified: Secondary | ICD-10-CM

## 2021-06-28 DIAGNOSIS — M25552 Pain in left hip: Secondary | ICD-10-CM

## 2021-06-28 DIAGNOSIS — M25652 Stiffness of left hip, not elsewhere classified: Secondary | ICD-10-CM

## 2021-06-28 NOTE — Therapy (Signed)
? ?OUTPATIENT PHYSICAL THERAPY PROGRESS NOTE ?  ? ? ?Patient Name: Hailey Fox ?MRN: [id number] ?DOB:1940/11/18, 81 y.o., female ?Today's Date: 06/28/2021 ? ?PCP: Chesley Noon, MD ?REFERRING PROVIDER: Chesley Noon, MD ? ? PT End of Session - 06/28/21 0804   ? ? Visit Number 16   ? Number of Visits 20   ? Date for PT Re-Evaluation 07/02/21   ? Authorization Type [employer] MCR   ? Authorization - Visit Number 16   ? Progress Note Due on Visit 20   ? PT Start Time 0801   ? PT Stop Time 0841   ? PT Time Calculation (min) 40 min   ? Activity Tolerance Patient tolerated treatment well   ? Behavior During Therapy [facility] for tasks assessed/performed   ? ?  ?  ? ?  ? ? ? ? ? ? ? ? ? ? ?Past Medical History:  ?Diagnosis Date  ? Allergy   ? Arthritis   ? Cancer [facility])   ? 3 skin cancers removed. 2 on legs, 1 from face  ? Cataract   ? bil  ? Complication of anesthesia   ? slow to wake up  ? Dysrhythmia   ? PAC's   ? GERD (gastroesophageal reflux disease)   ? Osteoporosis   ? PONV (postoperative nausea and vomiting)   ? ?Past Surgical History:  ?Procedure Laterality Date  ? APPENDECTOMY  early 10's  ? BREAST SURGERY Right 2003  ? palpiloma  ? HERNIA REPAIR    ? INGUINAL HERNIA REPAIR Left 08/18/2012  ? Procedure: LEFT INGUINAL HERNIA REPAIR;  Surgeon: Haywood Lasso, MD;  Location: [place];  Service: General;  Laterality: Left;  ? PARATHYROIDECTOMY  [id number]  ? TONSILLECTOMY  1945  ? TUBAL LIGATION  early 74's  ? ?Patient Active Problem List  ? Diagnosis Date Noted  ? 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 Heart palpitations 06/21/2020  ? PAC (premature atrial contraction) 06/21/2020  ? 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 Murmur 10/18/2019  ? Elevated blood pressure reading without diagnosis of hypertension 10/18/2019  ? Closed compression fracture of body of lumbar vertebra ([place]) 09/02/2017  ? Bilateral renal cysts 09/02/2017  ? Rotator cuff tear arthropathy of  right shoulder 07/16/2017  ? Radiculopathy, lumbar region 03/18/2017  ? Degenerative tear of meniscus, left 02/13/2017  ? Piriformis syndrome of right side 02/04/2017  ? Degenerative tear of medial meniscus of right knee 07/26/2016  ? Left knee pain 07/10/2016  ? Chronic heel pain, left 03/28/2016  ? 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 Multiple allergies 04/11/2015  ? GERD (gastroesophageal reflux disease) 04/11/2015  ? Left inguinal hernia 05/26/2012  ? ? ?REFERRING DIAG: Left hip pain Left knee pain Left hip bursitis OA ? ?THERAPY DIAG:  ?Pain in left hip ?  ?Stiffness of left hip, not elsewhere classified ?  ?Chronic pain of left knee ?  ?Difficulty walking ?  ?Muscle weakness (generalized) ? ?PERTINENT HISTORY: History of compression fracture L1, osteoporosis  ? ?PRECAUTIONS: None ? ?SUBJECTIVE:  Pt reports that the fall she had over week ago really shook her up.  Her Lt hip and lateral shin continue to hurt with walking down hills, "It's really the pits".  Applying pressure to the Lt hip during hills had helped in the past, but it hasn't this week.   ? ?PAIN:  ?Are you having pain? Yes ?NPRS scale: 3/10 ?Pain location: lateral hip, lateral lower leg ?  Pain orientation: Left  ?PAIN TYPE: aching and sharp ?Pain description: intermittent  ?Aggravating factors: descending hills  ?Relieving factors: resting ? ?PRECAUTIONS: None ? ?WEIGHT BEARING RESTRICTIONS No ? ?FALLS:  ?Has patient fallen in last 6 months? No, Number of falls: 0 ? ?LIVING ENVIRONMENT: ?Lives with: lives with their family and lives with their spouse (caregiver to husband who is blind) ?Lives in: House/apartment ?Stairs: Yes; ? ? ?OCCUPATION: retired Environmental consultant ? ?PLOF: Independent ? ?PATIENT GOALS : Pt wants to be able to walk for exercise, social outings, and mental health relief.  ? ? ?OBJECTIVE:  *All findings taken at [facility] unless otherwise noted.  ? ?DIAGNOSTIC FINDINGS:  ? ?L knee US Impression:  Degenerative meniscus and joint space  loss with moderate effusion of Left knee ? ? ?IMPRESSION: ?New mild compression deformity of the superior endplate of vertebral ?body L1, concerning for acute fracture. No definite retropulsion ?seen. ?PATIENT SURVEYS:  ?LEFS 52/80 points (or 65.00%) ?Lower Extremity Functional Score: 55 / 80 = 68.8 % ? ?5XSTS:  10.6s ? ? ?LE AROM/PROM: ?  ?A/PROM Right ?04/03/2021 Left ?04/03/2021 L 3/15  ?Hip flexion WFL 95 110  ?Hip extension WFL 5 10  ?Hip abduction WFL 25 30  ?Hip adduction       ?Hip internal rotation WFL in seated cross and uncross 15 30  ?Hip external rotation WFL in seated cross and uncross 30 45  ? (Blank rows = not tested) ?  ?LE MMT: ?  ?MMT Right ?04/03/2021 Left ?04/03/2021 L 3/15  ?Hip flexion 4+/5 4/5 4+/5  ?Hip extension 4+/5 4/5 4+/5  ?Hip abduction 4+/5 4/5 4+/5  ?Hip adduction 4+/5 4+/5 4+/5  ?Hip internal rotation 4+/5 4/5 4+/5  ?Hip external rotation 4+/5 4/5 4+/5  ? (Blank rows = not tested) ? ? ?TODAY'S TREATMENT: ? ?Pt seen for aquatic therapy today.  Treatment took place in water 3.25-4 ft in depth at the [facility] pool. Temp of water was 92?.  Pt entered/exited the pool via stairs step to/through pattern with bilat rail, independently. ? ?Once in water:  ?Walking without support - forward, back x 4 laps each, side stepping x 4 laps ?Tandem gait forward/ backward -challenging, moves at slow pace ?Tandem stance without support  ?Side step squat with rainbow dumbbells abdct/ add to the R x 4 reps, Lx 3 reps - challenging; repeated to Lt without dumbbells x 4 ?  High knee marching with rainbow dumbbells under water forward/ backward - improved ?  Hip circles with UE on wall x 10 each CW/CCW ?  Bilat calf stretch on stairs x 15 s ?  Slow forward step down, retro step up x 7 each leg with bilat rail (painful with retro step up on LLE) ?Standing hamstring stretch x 20s each  ? ? ?Pt requires buoyancy for support and to offload joints with strengthening exercises. Viscosity of the water is  needed for resistance of strengthening; water current perturbations provides challenge to standing balance unsupported, requiring increased core activation. ?  ? ? ?PATIENT EDUCATION:  ?Education details:  exercise progression, DOMS expectations ?Person educated: Patient ?Education method: Explanation, Demonstration, Tactile cues, Verbal cues ?Education comprehension: verbalized understanding, returned demonstration, verbal cues required, and tactile cues required ? ? ?HOME EXERCISE PROGRAM: ?Access Code: [email or web address] ?URL: [email or web address] ?Date: 04/30/2021 ?Prepared by: Daleen Bo ? ?Exercises ?Prone Quadriceps Stretch with Strap - 2 x Fox - 7 x weekly - 1 sets - 3 reps - 30 hold ?Supine Bridge with Resistance Band -  2 x Fox - 7 x weekly - 2 sets - 10 reps ?Seated Figure 4 Piriformis Stretch - 2 x Fox - 7 x weekly - 1 sets - 3 reps - 30 hold ?Sitting Knee Extension with Resistance - 1 x Fox - 3-4 x weekly - 3 sets - 10 reps ?Side Stepping with Resistance at Ankles - 1 x Fox - 3-4 x weekly - 3 sets - 10 reps ? ? ?ASSESSMENT: ? ?CLINICAL IMPRESSION: ?Pt's confidence with balance exercises in water is slowly improving.  Lt hip pain decreased while in the water.  Continued focus on hip mobility and dynamic balance in chest deep water. Pt seems to have had a set back with mobility and pain since her fall 1.5 wks ago. She has partially met her goals. She continues to benefit from continued PT intervention to maximize mobility and decrease fall risks.  ? ? ?Objective impairments include Abnormal gait, decreased activity tolerance, decreased endurance, decreased mobility, difficulty walking, decreased ROM, decreased strength, hypomobility, increased muscle spasms, impaired flexibility, improper body mechanics, postural dysfunction, and pain. These impairments are limiting patient from cleaning, community activity, driving, meal prep, laundry, yard work, shopping, and exercise and caregiver duties.  Personal factors including Age, Fitness, Past/current experiences, Time since onset of injury/illness/exacerbation, and 1-2 comorbidities:  are also affecting patient's functional outcome. Patient will b

## 2021-07-02 ENCOUNTER — Encounter (HOSPITAL_BASED_OUTPATIENT_CLINIC_OR_DEPARTMENT_OTHER): Payer: Self-pay | Admitting: Physical Therapy

## 2021-07-02 ENCOUNTER — Ambulatory Visit (HOSPITAL_BASED_OUTPATIENT_CLINIC_OR_DEPARTMENT_OTHER): Payer: Medicare PPO | Attending: Sports Medicine | Admitting: Physical Therapy

## 2021-07-02 DIAGNOSIS — M25552 Pain in left hip: Secondary | ICD-10-CM | POA: Diagnosis present

## 2021-07-02 DIAGNOSIS — M6281 Muscle weakness (generalized): Secondary | ICD-10-CM

## 2021-07-02 DIAGNOSIS — R262 Difficulty in walking, not elsewhere classified: Secondary | ICD-10-CM

## 2021-07-02 DIAGNOSIS — G8929 Other chronic pain: Secondary | ICD-10-CM

## 2021-07-02 DIAGNOSIS — M25562 Pain in left knee: Secondary | ICD-10-CM | POA: Insufficient documentation

## 2021-07-02 NOTE — Therapy (Signed)
? ?OUTPATIENT PHYSICAL THERAPY PROGRESS NOTE ?  ?Recertification ? ?Patient Name: Hailey Fox ?MRN: [id number] ?DOB:[birth year], 81 y.o., female ?Today's Date: 07/03/2021 ? ?PCP: Chesley Noon, MD ?REFERRING PROVIDER: Chesley Noon, MD ? ? PT End of Session - 07/02/21 1021   ? ? Visit Number 17   ? Number of Visits 29   ? Date for PT Re-Evaluation 08/13/21   ? Authorization Type [employer] MCR   ? Authorization - Visit Number 16   ? Progress Note Due on Visit 27   ? PT Start Time [phone]   ? PT Stop Time 1034   ? PT Time Calculation (min) 48 min   ? Activity Tolerance Patient tolerated treatment well   ? Behavior During Therapy [facility] for tasks assessed/performed   ? ?  ?  ? ?  ? ? ? ? ? ? ? ? ? ? ?Past Medical History:  ?Diagnosis Date  ? Allergy   ? Arthritis   ? Cancer [facility])   ? 3 skin cancers removed. 2 on legs, 1 from face  ? Cataract   ? bil  ? Complication of anesthesia   ? slow to wake up  ? Dysrhythmia   ? PAC's   ? GERD (gastroesophageal reflux disease)   ? Osteoporosis   ? PONV (postoperative nausea and vomiting)   ? ?Past Surgical History:  ?Procedure Laterality Date  ? APPENDECTOMY  early 72's  ? BREAST SURGERY Right 2003  ? palpiloma  ? HERNIA REPAIR    ? INGUINAL HERNIA REPAIR Left 08/18/2012  ? Procedure: LEFT INGUINAL HERNIA REPAIR;  Surgeon: Haywood Lasso, MD;  Location: [place];  Service: General;  Laterality: Left;  ? PARATHYROIDECTOMY  [id number]  ? TONSILLECTOMY  1945  ? TUBAL LIGATION  early 38's  ? ?Patient Active Problem List  ? Diagnosis Date Noted  ? 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 Heart palpitations 06/21/2020  ? PAC (premature atrial contraction) 06/21/2020  ? 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 Murmur 10/18/2019  ? Elevated blood pressure reading without diagnosis of hypertension 10/18/2019  ? Closed compression fracture of body of lumbar vertebra ([place]) 09/02/2017  ? Bilateral renal cysts 09/02/2017  ? Rotator cuff tear  arthropathy of right shoulder 07/16/2017  ? Radiculopathy, lumbar region 03/18/2017  ? Degenerative tear of meniscus, left 02/13/2017  ? Piriformis syndrome of right side 02/04/2017  ? Degenerative tear of medial meniscus of right knee 07/26/2016  ? Left knee pain 07/10/2016  ? Chronic heel pain, left 03/28/2016  ? 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 Multiple allergies 04/11/2015  ? GERD (gastroesophageal reflux disease) 04/11/2015  ? Left inguinal hernia 05/26/2012  ? ? ?REFERRING DIAG: Left hip pain Left knee pain Left hip bursitis OA ? ?THERAPY DIAG:  ?Pain in left hip ?  ?Stiffness of left hip, not elsewhere classified ?  ?Chronic pain of left knee ?  ?Difficulty walking ?  ?Muscle weakness (generalized) ? ?PERTINENT HISTORY: History of compression fracture L1, osteoporosis  ? ?PRECAUTIONS: None ? ?SUBJECTIVE: "Pt stating left hip continues to be uncomfortable with walking down hills/steps.  She has healed from fall, left shin without pain." ? ? ? ?PAIN:  ?Are you having pain? Yes ?NPRS scale: 3/10 ?Pain location: lateral hip, lateral lower leg ?Pain orientation: Left  ?PAIN TYPE: aching and sharp ?Pain description: intermittent  ?Aggravating factors: descending hills  ?Relieving factors: resting ? ?PRECAUTIONS: None ? ?WEIGHT BEARING RESTRICTIONS No ? ?  FALLS:  ?Has patient fallen in last 6 months? No, Number of falls: 0 ? ?LIVING ENVIRONMENT: ?Lives with: lives with their family and lives with their spouse (caregiver to husband who is blind) ?Lives in: House/apartment ?Stairs: Yes; ? ? ?OCCUPATION: retired Environmental consultant ? ?PLOF: Independent ? ?PATIENT GOALS : Pt wants to be able to walk for exercise, social outings, and mental health relief.  ? ? ?OBJECTIVE:  *All findings taken at [facility] unless otherwise noted.  ? ?DIAGNOSTIC FINDINGS:  ? ?L knee US Impression:  Degenerative meniscus and joint space loss with moderate effusion of Left knee ? ? ?IMPRESSION: ?New mild compression deformity of the superior  endplate of vertebral ?body L1, concerning for acute fracture. No definite retropulsion ?seen. ?PATIENT SURVEYS:  ?LEFS 52/80 points (or 65.00%) ?Lower Extremity Functional Score: 55 / 80 = 68.8 % ?LEFS 07/02/21  39/80= 49% ? ?5XSTS:  10.6s   07/02/21 11.6 ?[place] 35/56 07/02/21 ? ? ?LE AROM/PROM: ?  ?A/PROM Right ?04/03/2021 Left ?04/03/2021 L 3/15  ?Hip flexion WFL 95 110  ?Hip extension WFL 5 10  ?Hip abduction WFL 25 30  ?Hip adduction       ?Hip internal rotation WFL in seated cross and uncross 15 30  ?Hip external rotation WFL in seated cross and uncross 30 45  ? (Blank rows = not tested) ?  ?LE MMT: ?  ?MMT Right ?04/03/2021 Left ?04/03/2021 L 3/15  ?Hip flexion 4+/5 4/5 4+/5  ?Hip extension 4+/5 4/5 4+/5  ?Hip abduction 4+/5 4/5 4+/5  ?Hip adduction 4+/5 4+/5 4+/5  ?Hip internal rotation 4+/5 4/5 4+/5  ?Hip external rotation 4+/5 4/5 4+/5  ? (Blank rows = not tested) ? ? ?TODAY'S TREATMENT: ? ?Pt seen for aquatic therapy today.  Treatment took place in water 3.25-4 ft in depth at the [facility] pool. Temp of water was 92?.  Pt entered/exited the pool via stairs step to/through pattern with bilat rail, independently. ? ? ?Walking without support - forward, back x 4 laps each, side stepping x 4 laps ?Tandem gait forward ue supported by yellow then rainbow hand buoys x 6 widths forward and 2 backward.  Improves with cue to focus not on the water or looking down rather ?Tandem stance without support. Holding x 20s R/L after several tries ?SLS ue supported by yellow hand buoys, then without ue support (challenged to lift them up). Several tries with ue support able to maintain with practice x 20s R/L.  Best without UE support x8s ?   ?Pt requires buoyancy for support and to offload joints with strengthening exercises. Viscosity of the water is needed for resistance of strengthening; water current perturbations provides challenge to standing balance unsupported, requiring increased core activation. ?  ?Gait  and  stair climbing training. ?Objective testing ? ?Self Care: pt edu and instruction on managing "bad days" or temporary flairs of pain in LB and knees.  ?-movement is always good to open up joint spaces and improve circulation.  A little movmeent is better than no movement. ?- use of heated throw for entire body "heating pad" ? ? ?PATIENT EDUCATION:  ?Education details:  exercise progression, DOMS expectations ?Person educated: Patient ?Education method: Explanation, Demonstration, Tactile cues, Verbal cues ?Education comprehension: verbalized understanding, returned demonstration, verbal cues required, and tactile cues required ? ? ?HOME EXERCISE PROGRAM: ?Access Code: [email or web address] ?URL: [email or web address] ?Date: 04/30/2021 ?Prepared by: Daleen Bo ? ?Exercises ?Prone Quadriceps Stretch with Strap - 2 x daily - 7 x weekly - 1 sets -  3 reps - 30 hold ?Supine Bridge with Resistance Band - 2 x daily - 7 x weekly - 2 sets - 10 reps ?Seated Figure 4 Piriformis Stretch - 2 x daily - 7 x weekly - 1 sets - 3 reps - 30 hold ?Sitting Knee Extension with Resistance - 1 x daily - 3-4 x weekly - 3 sets - 10 reps ?Side Stepping with Resistance at Ankles - 1 x daily - 3-4 x weekly - 3 sets - 10 reps ? ? ?ASSESSMENT: ? ?CLINICAL IMPRESSION: ?Pt reports aquatic therapy has helped make her stronger.  She is able to walk further distances since beginning but has max limit of 1.5 miles which "causes bad pain in left hip". She continues to have difficulty descending steps and completes with step to pattern but rises using step through (and UE support on handrail). She has had a set back with a fall she has ~ 2 week ago decreasing score on LEFS which she had been progressing with. 5x STS test slowed slightly to 11.06s although her original goal had been met. We will continue to see Pt for skilled physical therapy to return pt to prefall condition with anticipation to reach maximal potential. New goals added.  ? ? ? ? ?Objective  impairments include Abnormal gait, decreased activity tolerance, decreased endurance, decreased mobility, difficulty walking, decreased ROM, decreased strength, hypomobility, increased muscle spasms, impaired

## 2021-07-05 ENCOUNTER — Ambulatory Visit (HOSPITAL_BASED_OUTPATIENT_CLINIC_OR_DEPARTMENT_OTHER): Payer: Medicare PPO | Admitting: Physical Therapy

## 2021-07-05 ENCOUNTER — Encounter (HOSPITAL_BASED_OUTPATIENT_CLINIC_OR_DEPARTMENT_OTHER): Payer: Self-pay | Admitting: Physical Therapy

## 2021-07-05 DIAGNOSIS — R262 Difficulty in walking, not elsewhere classified: Secondary | ICD-10-CM

## 2021-07-05 DIAGNOSIS — M6281 Muscle weakness (generalized): Secondary | ICD-10-CM

## 2021-07-05 DIAGNOSIS — M25552 Pain in left hip: Secondary | ICD-10-CM | POA: Diagnosis not present

## 2021-07-05 DIAGNOSIS — G8929 Other chronic pain: Secondary | ICD-10-CM

## 2021-07-05 NOTE — Therapy (Signed)
? ?OUTPATIENT PHYSICAL THERAPY PROGRESS NOTE ?  ?Recertification ? ?Patient Name: Hailey Fox ?MRN: [id number] ?DOB:November 15, 1940, 81 y.o., female ?Today's Date: 07/05/2021 ? ?PCP: Chesley Noon, MD ?REFERRING PROVIDER: Chesley Noon, MD ? ? PT End of Session - 07/05/21 0947   ? ? Visit Number 18   KX at 15  ? Number of Visits 29   ? Date for PT Re-Evaluation 08/13/21   ? Authorization Type [employer] MCR   ? Authorization - Visit Number 18   ? Progress Note Due on Visit 27   ? PT Start Time 9024   ? PT Stop Time 1015   ? PT Time Calculation (min) 43 min   ? Activity Tolerance Patient tolerated treatment well   ? Behavior During Therapy [facility] for tasks assessed/performed   ? ?  ?  ? ?  ? ? ? ? ? ? ? ? ? ? ?Past Medical History:  ?Diagnosis Date  ? Allergy   ? Arthritis   ? Cancer [facility])   ? 3 skin cancers removed. 2 on legs, 1 from face  ? Cataract   ? bil  ? Complication of anesthesia   ? slow to wake up  ? Dysrhythmia   ? PAC's   ? GERD (gastroesophageal reflux disease)   ? Osteoporosis   ? PONV (postoperative nausea and vomiting)   ? ?Past Surgical History:  ?Procedure Laterality Date  ? APPENDECTOMY  early 14's  ? BREAST SURGERY Right 2003  ? palpiloma  ? HERNIA REPAIR    ? INGUINAL HERNIA REPAIR Left 08/18/2012  ? Procedure: LEFT INGUINAL HERNIA REPAIR;  Surgeon: Haywood Lasso, MD;  Location: [facility];  Service: General;  Laterality: Left;  ? PARATHYROIDECTOMY  [id number]  ? TONSILLECTOMY  1945  ? TUBAL LIGATION  early 55's  ? ?Patient Active Problem List  ? Diagnosis Date Noted  ? 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 Heart palpitations 06/21/2020  ? PAC (premature atrial contraction) 06/21/2020  ? 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 Murmur 10/18/2019  ? Elevated blood pressure reading without diagnosis of hypertension 10/18/2019  ? Closed compression fracture of body of lumbar vertebra ([place]) 09/02/2017  ? Bilateral renal cysts 09/02/2017  ? Rotator  cuff tear arthropathy of right shoulder 07/16/2017  ? Radiculopathy, lumbar region 03/18/2017  ? Degenerative tear of meniscus, left 02/13/2017  ? Piriformis syndrome of right side 02/04/2017  ? Degenerative tear of medial meniscus of right knee 07/26/2016  ? Left knee pain 07/10/2016  ? Chronic heel pain, left 03/28/2016  ? 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 Multiple allergies 04/11/2015  ? GERD (gastroesophageal reflux disease) 04/11/2015  ? Left inguinal hernia 05/26/2012  ? ? ?REFERRING DIAG: Left hip pain Left knee pain Left hip bursitis OA ? ?THERAPY DIAG:  ?Pain in left hip ?  ?Stiffness of left hip, not elsewhere classified ?  ?Chronic pain of left knee ?  ?Difficulty walking ?  ?Muscle weakness (generalized) ? ?PERTINENT HISTORY: History of compression fracture L1, osteoporosis  ? ?PRECAUTIONS: None ? ?SUBJECTIVE: Pt reports that her Lt hip pain woke her up last night.  She also awoke with pain today.  She states that yesterday was sleepy day; didn't notice any pain yesterday.   ? ? ?PAIN:  ?Are you having pain? Yes ?NPRS scale: 4/10 ?Pain location: lateral hip ?Pain orientation: Left  ?PAIN TYPE: aching and sharp ?Pain description: intermittent  ?Aggravating factors: descending hills  ?  Relieving factors: resting ? ?PRECAUTIONS: None ? ?WEIGHT BEARING RESTRICTIONS No ? ?FALLS:  ?Has patient fallen in last 6 months? No, Number of falls: 0 ? ?LIVING ENVIRONMENT: ?Lives with: lives with their family and lives with their spouse (caregiver to husband who is blind) ?Lives in: House/apartment ?Stairs: Yes; ? ? ?OCCUPATION: retired Environmental consultant ? ?PLOF: Independent ? ?PATIENT GOALS : Pt wants to be able to walk for exercise, social outings, and mental health relief.  ? ? ?OBJECTIVE:  *All findings taken at [facility] unless otherwise noted.  ? ?DIAGNOSTIC FINDINGS:  ? ?L knee US Impression:  Degenerative meniscus and joint space loss with moderate effusion of Left knee ? ? ?IMPRESSION: ?New mild compression  deformity of the superior endplate of vertebral ?body L1, concerning for acute fracture. No definite retropulsion ?seen. ?PATIENT SURVEYS:  ?LEFS 52/80 points (or 65.00%) ?Lower Extremity Functional Score: 55 / 80 = 68.8 % ?LEFS 07/02/21  39/80= 49% ? ?5XSTS:  10.6s   07/02/21 11.6 ?Berg 35/56 07/02/21 ? ? ?LE AROM/PROM: ?  ?A/PROM Right ?04/03/2021 Left ?04/03/2021 L 3/15  ?Hip flexion WFL 95 110  ?Hip extension WFL 5 10  ?Hip abduction WFL 25 30  ?Hip adduction       ?Hip internal rotation WFL in seated cross and uncross 15 30  ?Hip external rotation WFL in seated cross and uncross 30 45  ? (Blank rows = not tested) ?  ?LE MMT: ?  ?MMT Right ?04/03/2021 Left ?04/03/2021 L 3/15  ?Hip flexion 4+/5 4/5 4+/5  ?Hip extension 4+/5 4/5 4+/5  ?Hip abduction 4+/5 4/5 4+/5  ?Hip adduction 4+/5 4+/5 4+/5  ?Hip internal rotation 4+/5 4/5 4+/5  ?Hip external rotation 4+/5 4/5 4+/5  ? (Blank rows = not tested) ? ?PALPATION:  ?5/4: Pt is point tender at L greater trochanter, Lt glute med/min, and piriformis insertion ? ?TODAY'S TREATMENT: ? ?Pt seen for aquatic therapy today.  Treatment took place in water 3.25-4 ft in depth at the [facility] pool. Temp of water was 92?.  Pt entered/exited the pool via stairs step to/through pattern with bilat rail, independently. ? ? ?Walking without support - forward, back x 4 laps each, side stepping x 4 laps ?Braiding L/R - multiple reps for dynamic stretch of ITB ?Leg swings abdct/ add crossing midline, UE on pool x 12 ea ?Forward walking lunges with rainbow hand buoys on surface (under water too challenging) ?Rainbow hand buoys at side, high knee forward marching ?Squat with rainbow hand buoys to side x 5 (challenging) ?SLS - 2 reps each leg, with/without UE support x 12-15s each ?Suspended bilat clam x 15, yellow noodle under arms ?Lt/Rt hurdle leg, UE on yellow noodle ?Forward/ backward tandem gait ?Lt hamstring, ITB, adductor stretch with foot supported on blue square noodle x 15s each  position (challenge to get foot on noodle) ? ?   ?Pt requires buoyancy for support and to offload joints with strengthening exercises. Viscosity of the water is needed for resistance of strengthening; water current perturbations provides challenge to standing balance unsupported, requiring increased core activation. ? ?PATIENT EDUCATION:  ?Education details:  exercise progression, DOMS expectations ?Person educated: Patient ?Education method: Explanation, Demonstration, Tactile cues, Verbal cues ?Education comprehension: verbalized understanding, returned demonstration, verbal cues required, and tactile cues required ? ? ?HOME EXERCISE PROGRAM: ?Access Code: [email or web address] ?URL: [email or web address] ?Date: 04/30/2021 ?Prepared by: Daleen Bo ? ?Exercises ?Prone Quadriceps Stretch with Strap - 2 x daily - 7 x weekly - 1 sets - 3 reps -  30 hold ?Supine Bridge with Resistance Band - 2 x daily - 7 x weekly - 2 sets - 10 reps ?Seated Figure 4 Piriformis Stretch - 2 x daily - 7 x weekly - 1 sets - 3 reps - 30 hold ?Sitting Knee Extension with Resistance - 1 x daily - 3-4 x weekly - 3 sets - 10 reps ?Side Stepping with Resistance at Ankles - 1 x daily - 3-4 x weekly - 3 sets - 10 reps ? ?07/05/21: Verbally added supine ITB stretch with strap - gave visual demo ? ?ASSESSMENT: ? ?CLINICAL IMPRESSION: ?Pt arrived with elevated pain level today. While in water, pt performed exercises in pain-free state.  Her balance is challenged with SLS no support and tandem gait.  She felt good stretch to Lt lateral hip with leg swings, braiding, and standing ITB stretch.  Pain in Lt hip was 0/10 at completion of session.  ?We will continue to see Pt for skilled physical therapy to return pt to prefall condition with anticipation to reach maximal potential.  ? ? ? ?Objective impairments include Abnormal gait, decreased activity tolerance, decreased endurance, decreased mobility, difficulty walking, decreased ROM, decreased strength,  hypomobility, increased muscle spasms, impaired flexibility, improper body mechanics, postural dysfunction, and pain. These impairments are limiting patient from cleaning, community activity, driving, meal prep

## 2021-07-09 ENCOUNTER — Ambulatory Visit (HOSPITAL_BASED_OUTPATIENT_CLINIC_OR_DEPARTMENT_OTHER): Payer: Medicare PPO | Admitting: Physical Therapy

## 2021-07-09 ENCOUNTER — Encounter (HOSPITAL_BASED_OUTPATIENT_CLINIC_OR_DEPARTMENT_OTHER): Payer: Self-pay | Admitting: Physical Therapy

## 2021-07-09 DIAGNOSIS — M6281 Muscle weakness (generalized): Secondary | ICD-10-CM

## 2021-07-09 DIAGNOSIS — R262 Difficulty in walking, not elsewhere classified: Secondary | ICD-10-CM

## 2021-07-09 DIAGNOSIS — M25552 Pain in left hip: Secondary | ICD-10-CM | POA: Diagnosis not present

## 2021-07-09 DIAGNOSIS — G8929 Other chronic pain: Secondary | ICD-10-CM

## 2021-07-09 NOTE — Therapy (Signed)
? ?OUTPATIENT PHYSICAL THERAPY PROGRESS NOTE ?  ?Recertification ? ?Patient Name: Hailey Fox ?MRN: [id number] ?DOB:01-07-1941, 81 y.o., female ?Today's Date: 07/09/2021 ? ?PCP: Chesley Noon, MD ?REFERRING PROVIDER: Chesley Noon, MD ? ? PT End of Session - 07/09/21 1150   ? ? Visit Number 19   ? Number of Visits 29   ? Date for PT Re-Evaluation 08/13/21   ? Authorization Type [employer] MCR   ? Authorization - Visit Number 18   ? Progress Note Due on Visit 27   ? PT Start Time 1031   ? PT Stop Time 1115   ? PT Time Calculation (min) 44 min   ? Activity Tolerance Patient tolerated treatment well   ? Behavior During Therapy [facility] for tasks assessed/performed   ? ?  ?  ? ?  ? ? ? ? ? ? ? ? ? ? ? ?Past Medical History:  ?Diagnosis Date  ? Allergy   ? Arthritis   ? Cancer [facility])   ? 3 skin cancers removed. 2 on legs, 1 from face  ? Cataract   ? bil  ? Complication of anesthesia   ? slow to wake up  ? Dysrhythmia   ? PAC's   ? GERD (gastroesophageal reflux disease)   ? Osteoporosis   ? PONV (postoperative nausea and vomiting)   ? ?Past Surgical History:  ?Procedure Laterality Date  ? APPENDECTOMY  early 30's  ? BREAST SURGERY Right 2003  ? palpiloma  ? HERNIA REPAIR    ? INGUINAL HERNIA REPAIR Left 08/18/2012  ? Procedure: LEFT INGUINAL HERNIA REPAIR;  Surgeon: Haywood Lasso, MD;  Location: [place];  Service: General;  Laterality: Left;  ? PARATHYROIDECTOMY  [id number]  ? TONSILLECTOMY  1945  ? TUBAL LIGATION  early 49's  ? ?Patient Active Problem List  ? Diagnosis Date Noted  ? 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 Heart palpitations 06/21/2020  ? PAC (premature atrial contraction) 06/21/2020  ? 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 Murmur 10/18/2019  ? Elevated blood pressure reading without diagnosis of hypertension 10/18/2019  ? Closed compression fracture of body of lumbar vertebra ([place]) 09/02/2017  ? Bilateral renal cysts 09/02/2017  ? Rotator cuff tear  arthropathy of right shoulder 07/16/2017  ? Radiculopathy, lumbar region 03/18/2017  ? Degenerative tear of meniscus, left 02/13/2017  ? Piriformis syndrome of right side 02/04/2017  ? Degenerative tear of medial meniscus of right knee 07/26/2016  ? Left knee pain 07/10/2016  ? Chronic heel pain, left 03/28/2016  ? 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 Multiple allergies 04/11/2015  ? GERD (gastroesophageal reflux disease) 04/11/2015  ? Left inguinal hernia 05/26/2012  ? ? ?REFERRING DIAG: Left hip pain Left knee pain Left hip bursitis OA ? ?THERAPY DIAG:  ?Pain in left hip ?  ?Stiffness of left hip, not elsewhere classified ?  ?Chronic pain of left knee ?  ?Difficulty walking ?  ?Muscle weakness (generalized) ? ?PERTINENT HISTORY: History of compression fracture L1, osteoporosis  ? ?PRECAUTIONS: None ? ?SUBJECTIVE: "I did some kind of hurdle walk with Jenn the last visit which I have been also doing at home and I was able to walk up 3 hills yesterday with almost no hip pain for the first time" ? ? ?PAIN:  ?Are you having pain? Yes ?NPRS scale: 3/10 ?Pain location: lateral hip ?Pain orientation: Left  ?PAIN TYPE: aching and sharp ?Pain description: intermittent  ?Aggravating factors: descending  hills  ?Relieving factors: resting ? ?PRECAUTIONS: None ? ?WEIGHT BEARING RESTRICTIONS No ? ?FALLS:  ?Has patient fallen in last 6 months? No, Number of falls: 0 ? ?LIVING ENVIRONMENT: ?Lives with: lives with their family and lives with their spouse (caregiver to husband who is blind) ?Lives in: House/apartment ?Stairs: Yes; ? ? ?OCCUPATION: retired Environmental consultant ? ?PLOF: Independent ? ?PATIENT GOALS : Pt wants to be able to walk for exercise, social outings, and mental health relief.  ? ? ?OBJECTIVE:  *All findings taken at [facility] unless otherwise noted.  ? ?DIAGNOSTIC FINDINGS:  ? ?L knee US Impression:  Degenerative meniscus and joint space loss with moderate effusion of Left knee ? ? ?IMPRESSION: ?New mild compression  deformity of the superior endplate of vertebral ?body L1, concerning for acute fracture. No definite retropulsion ?seen. ?PATIENT SURVEYS:  ?LEFS 52/80 points (or 65.00%) ?Lower Extremity Functional Score: 55 / 80 = 68.8 % ?LEFS 07/02/21  39/80= 49% ? ?5XSTS:  10.6s   07/02/21 11.6 ?Berg 35/56 07/02/21 ? ? ?LE AROM/PROM: ?  ?A/PROM Right ?04/03/2021 Left ?04/03/2021 L 3/15  ?Hip flexion WFL 95 110  ?Hip extension WFL 5 10  ?Hip abduction WFL 25 30  ?Hip adduction       ?Hip internal rotation WFL in seated cross and uncross 15 30  ?Hip external rotation WFL in seated cross and uncross 30 45  ? (Blank rows = not tested) ?  ?LE MMT: ?  ?MMT Right ?04/03/2021 Left ?04/03/2021 L 3/15  ?Hip flexion 4+/5 4/5 4+/5  ?Hip extension 4+/5 4/5 4+/5  ?Hip abduction 4+/5 4/5 4+/5  ?Hip adduction 4+/5 4+/5 4+/5  ?Hip internal rotation 4+/5 4/5 4+/5  ?Hip external rotation 4+/5 4/5 4+/5  ? (Blank rows = not tested) ? ?PALPATION:  ?5/4: Pt is point tender at L greater trochanter, Lt glute med/min, and piriformis insertion ? ?TODAY'S TREATMENT: ? ?Pt seen for aquatic therapy today.  Treatment took place in water 3.25-4 ft in depth at the [employer] pool. Temp of water was 92?.  Pt entered/exited the pool via stairs step to/through pattern with bilat rail, independently. ? ? ?Walking without support - forward, back x 4 laps each, side stepping x 4 laps ?Static IT band stretch leaning against wall R/L x25 s ea ?Braiding L/R - 4 widths for dynamic stretch of ITB ? ?Forward walking lunges and backward lunges with vc and demonstration x5-8 ea ? ?Holding to wall bilat clam x 15 ?Lt/Rt hurdle leg, UE on yellow hand buoys 4 widths of pool ?Forward/ backward tandem gait x4 widths ? ?Balance ?FT with vision; head movements R/L then vertical; VE ?Semi-tandem with vision; head movements;VE Leading R/L ? ?   ?Pt requires buoyancy for support and to offload joints with strengthening exercises. Viscosity of the water is needed for resistance of  strengthening; water current perturbations provides challenge to standing balance unsupported, requiring increased core activation. ? ?PATIENT EDUCATION:  ?Education details:  exercise progression, DOMS expectations ?Person educated: Patient ?Education method: Explanation, Demonstration, Tactile cues, Verbal cues ?Education comprehension: verbalized understanding, returned demonstration, verbal cues required, and tactile cues required ? ? ?HOME EXERCISE PROGRAM: ?Access Code: [email or web address] ?URL: [email or web address] ?Date: 04/30/2021 ?Prepared by: Daleen Bo ? ?Exercises ?Prone Quadriceps Stretch with Strap - 2 x daily - 7 x weekly - 1 sets - 3 reps - 30 hold ?Supine Bridge with Resistance Band - 2 x daily - 7 x weekly - 2 sets - 10 reps ?Seated Figure 4 Piriformis Stretch -  2 x daily - 7 x weekly - 1 sets - 3 reps - 30 hold ?Sitting Knee Extension with Resistance - 1 x daily - 3-4 x weekly - 3 sets - 10 reps ?Side Stepping with Resistance at Ankles - 1 x daily - 3-4 x weekly - 3 sets - 10 reps ? ?07/05/21: Verbally added supine ITB stretch with strap - gave visual demo ? ?ASSESSMENT: ? ?CLINICAL IMPRESSION: ?Pt presents reporting decreased sx with 1 mile walk on sat. She has been completing HEP daily as instructed along with adding some of the aquatic exercises modified holding to sink in kitchen. Pt requiring some verbal cues as well as demonstration for proper execution of tasks. Reviewed goals with pt encouraging increasing 1 mile walks at home to 3x weekly. Hip pain 0/10 upon completion of session.  ? ? ? ? ? ? ?Objective impairments include Abnormal gait, decreased activity tolerance, decreased endurance, decreased mobility, difficulty walking, decreased ROM, decreased strength, hypomobility, increased muscle spasms, impaired flexibility, improper body mechanics, postural dysfunction, and pain. These impairments are limiting patient from cleaning, community activity, driving, meal prep, laundry, yard  work, shopping, and exercise and caregiver duties. Personal factors including Age, Fitness, Past/current experiences, Time since onset of injury/illness/exacerbation, and 1-2 comorbidities:  are also affecting

## 2021-07-11 ENCOUNTER — Encounter (HOSPITAL_BASED_OUTPATIENT_CLINIC_OR_DEPARTMENT_OTHER): Payer: Self-pay | Admitting: Physical Therapy

## 2021-07-11 ENCOUNTER — Ambulatory Visit (HOSPITAL_BASED_OUTPATIENT_CLINIC_OR_DEPARTMENT_OTHER): Payer: Medicare PPO | Admitting: Physical Therapy

## 2021-07-11 DIAGNOSIS — M25552 Pain in left hip: Secondary | ICD-10-CM

## 2021-07-11 DIAGNOSIS — M6281 Muscle weakness (generalized): Secondary | ICD-10-CM

## 2021-07-11 DIAGNOSIS — R262 Difficulty in walking, not elsewhere classified: Secondary | ICD-10-CM

## 2021-07-11 DIAGNOSIS — G8929 Other chronic pain: Secondary | ICD-10-CM

## 2021-07-11 NOTE — Therapy (Addendum)
? ?OUTPATIENT PHYSICAL THERAPY TREATMENT NOTE ?  ?Recertification ? ?Patient Name: Hailey Fox ?MRN: [id number] ?DOB:Dec 28, 1940, 81 y.o., female ?Today's Date: 07/11/2021 ? ?PCP: Chesley Noon, MD ?REFERRING PROVIDER: Chesley Noon, MD ? ? PT End of Session - 07/11/21 1012   ? ? Visit Number 20   ? Number of Visits 29   ? Date for PT Re-Evaluation 08/13/21   ? Authorization Type [employer] MCR   ? Progress Note Due on Visit 27   ? PT Start Time 1009   ? PT Stop Time 1052   ? PT Time Calculation (min) 43 min   ? Activity Tolerance Patient tolerated treatment well   ? Behavior During Therapy [facility] for tasks assessed/performed   ? ?  ?  ? ?  ? ? ? ? ? ? ? ? ? ? ? ?Past Medical History:  ?Diagnosis Date  ? Allergy   ? Arthritis   ? Cancer [facility])   ? 3 skin cancers removed. 2 on legs, 1 from face  ? Cataract   ? bil  ? Complication of anesthesia   ? slow to wake up  ? Dysrhythmia   ? PAC's   ? GERD (gastroesophageal reflux disease)   ? Osteoporosis   ? PONV (postoperative nausea and vomiting)   ? ?Past Surgical History:  ?Procedure Laterality Date  ? APPENDECTOMY  early 42's  ? BREAST SURGERY Right 2003  ? palpiloma  ? HERNIA REPAIR    ? INGUINAL HERNIA REPAIR Left 08/18/2012  ? Procedure: LEFT INGUINAL HERNIA REPAIR;  Surgeon: Haywood Lasso, MD;  Location: [place];  Service: General;  Laterality: Left;  ? PARATHYROIDECTOMY  [id number]  ? TONSILLECTOMY  1945  ? TUBAL LIGATION  early 53's  ? ?Patient Active Problem List  ? Diagnosis Date Noted  ? 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 Heart palpitations 06/21/2020  ? PAC (premature atrial contraction) 06/21/2020  ? 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 Murmur 10/18/2019  ? Elevated blood pressure reading without diagnosis of hypertension 10/18/2019  ? Closed compression fracture of body of lumbar vertebra ([place]) 09/02/2017  ? Bilateral renal cysts 09/02/2017  ? Rotator cuff tear arthropathy of right shoulder  07/16/2017  ? Radiculopathy, lumbar region 03/18/2017  ? Degenerative tear of meniscus, left 02/13/2017  ? Piriformis syndrome of right side 02/04/2017  ? Degenerative tear of medial meniscus of right knee 07/26/2016  ? Left knee pain 07/10/2016  ? Chronic heel pain, left 03/28/2016  ? 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 Multiple allergies 04/11/2015  ? GERD (gastroesophageal reflux disease) 04/11/2015  ? Left inguinal hernia 05/26/2012  ? ? ?REFERRING DIAG: Left hip pain Left knee pain Left hip bursitis OA ? ?THERAPY DIAG:  ?Pain in left hip ?  ?Stiffness of left hip, not elsewhere classified ?  ?Chronic pain of left knee ?  ?Difficulty walking ?  ?Muscle weakness (generalized) ? ?PERTINENT HISTORY: History of compression fracture L1, osteoporosis  ? ?PRECAUTIONS: None ? ?SUBJECTIVE: Pt reports she continues to have pain in Lt knee/ Lt hip when getting out of car; pain persists after out of car when walking.  She reports she is slowly getting better.   ? ?PAIN:  ?Are you having pain? Yes ?NPRS scale: 2-3/10 after getting out of car; resolved to 0 after seated rest ?Pain location: lateral hip ?Pain orientation: Left  ?PAIN TYPE: aching and sharp ?Pain description: intermittent  ?Aggravating factors: descending hills  ?  Relieving factors: resting ? ?PRECAUTIONS: None ? ?WEIGHT BEARING RESTRICTIONS No ? ?FALLS:  ?Has patient fallen in last 6 months? No, Number of falls: 0 ? ?LIVING ENVIRONMENT: ?Lives with: lives with their family and lives with their spouse (caregiver to husband who is blind) ?Lives in: House/apartment ?Stairs: Yes; ? ? ?OCCUPATION: retired Environmental consultant ? ?PLOF: Independent ? ?PATIENT GOALS : Pt wants to be able to walk for exercise, social outings, and mental health relief.  ? ? ?OBJECTIVE:  *All findings taken at [facility] unless otherwise noted.  ? ?DIAGNOSTIC FINDINGS:  ? ?L knee US Impression:  Degenerative meniscus and joint space loss with moderate effusion of Left knee ? ? ?IMPRESSION: ?New  mild compression deformity of the superior endplate of vertebral ?body L1, concerning for acute fracture. No definite retropulsion ?seen. ?PATIENT SURVEYS:  ?LEFS 52/80 points (or 65.00%) ?Lower Extremity Functional Score: 55 / 80 = 68.8 % ?LEFS 07/02/21  39/80= 49% ? ?5XSTS:  10.6s   07/02/21 11.6 ? ? ?Berg 35/56 07/02/21 ?BERG Balance Test ?Transfers  3  ?Standing unsupported with eyes closed 2  ?Standing unsupported feet together 3  ?From standing position, reach forward with outstretched arm 3  ?From standing position, pick up object from floor 3  ?From standing position, turn and look behind over each shoulder 2  ?Turn 360? 1  ?Standing unsupported, alternately place foot on step 3  ?Standing unsupported, one foot in front 0  ?Standing on one leg 1  ?Total:  335/56  ? ?LE AROM/PROM: ?  ?A/PROM Right ?04/03/2021 Left ?04/03/2021 L 3/15  ?Hip flexion WFL 95 110  ?Hip extension WFL 5 10  ?Hip abduction WFL 25 30  ?Hip adduction       ?Hip internal rotation WFL in seated cross and uncross 15 30  ?Hip external rotation WFL in seated cross and uncross 30 45  ? (Blank rows = not tested) ?  ?LE MMT: ?  ?MMT Right ?04/03/2021 Left ?04/03/2021 L 3/15  ?Hip flexion 4+/5 4/5 4+/5  ?Hip extension 4+/5 4/5 4+/5  ?Hip abduction 4+/5 4/5 4+/5  ?Hip adduction 4+/5 4+/5 4+/5  ?Hip internal rotation 4+/5 4/5 4+/5  ?Hip external rotation 4+/5 4/5 4+/5  ? (Blank rows = not tested) ? ?PALPATION:  ?5/4: Pt is point tender at L greater trochanter, Lt glute med/min, and piriformis insertion ? ?TODAY'S TREATMENT: ? ?Pt seen for aquatic therapy today.  Treatment took place in water 3.25-4 ft in depth at the [facility] pool. Temp of water was 92?.  Pt entered/exited the pool via stairs step to/through pattern with bilat rail, independently. ? ? ?Walking without support - forward, back x 3 laps each, side stepping x 3 laps ?Braiding L/R - 3 laps (cues to allow hips to swivel) ?Tandem gait forward/ backward, holding yellow hand buoys ?Rt  SLS, multiple attempts up to 15 sec ?Lt/Rt hurdle leg, alternating legs, UE on yellow noodle; trial with arms/legs coordinating ?Forward/ backward lunges with arms horiz abdc/add x 10 each (similar to [employer])- very challenging with back lunge, 5 each ?Side step with squat and shoulder abdct/ add R/L ?Static IT band stretch leaning against wall R/L x25 s ea ? ?   ?Pt requires buoyancy for support and to offload joints with strengthening exercises. Viscosity of the water is needed for resistance of strengthening; water current perturbations provides challenge to standing balance unsupported, requiring increased core activation. ? ?PATIENT EDUCATION:  ?Education details:  exercise progression, DOMS expectations ?Person educated: Patient ?  Education method: Explanation, Demonstration, Tactile cues, Verbal cues ?Education comprehension: verbalized understanding, returned demonstration, verbal cues required, and tactile cues required ? ? ?HOME EXERCISE PROGRAM: ?Access Code: [email or web address] ?URL: [email or web address] ?Date: 04/30/2021 ?Prepared by: Daleen Bo ? ?Exercises ?Prone Quadriceps Stretch with Strap - 2 x daily - 7 x weekly - 1 sets - 3 reps - 30 hold ?Supine Bridge with Resistance Band - 2 x daily - 7 x weekly - 2 sets - 10 reps ?Seated Figure 4 Piriformis Stretch - 2 x daily - 7 x weekly - 1 sets - 3 reps - 30 hold ?Sitting Knee Extension with Resistance - 1 x daily - 3-4 x weekly - 3 sets - 10 reps ?Side Stepping with Resistance at Ankles - 1 x daily - 3-4 x weekly - 3 sets - 10 reps ? ?07/05/21: Verbally added supine ITB stretch with strap - gave visual demo ? ?ASSESSMENT: ? ?CLINICAL IMPRESSION: ?Pt has difficulty with R SLS, Lt forward lunge and Rt backward lunge.  Overall Lt hip/knee pain is improving again.  Encouraged pt to continue walking 1 mile and working on balance exercises from HEP.   Hip pain 0/10 upon completion of session. Progressing well towards remaining goals.  ? ?Objective  impairments include Abnormal gait, decreased activity tolerance, decreased endurance, decreased mobility, difficulty walking, decreased ROM, decreased strength, hypomobility, increased muscle spasms, impaired f

## 2021-07-16 ENCOUNTER — Ambulatory Visit (HOSPITAL_BASED_OUTPATIENT_CLINIC_OR_DEPARTMENT_OTHER): Payer: Medicare PPO | Admitting: Physical Therapy

## 2021-07-16 ENCOUNTER — Encounter (HOSPITAL_BASED_OUTPATIENT_CLINIC_OR_DEPARTMENT_OTHER): Payer: Self-pay | Admitting: Physical Therapy

## 2021-07-16 DIAGNOSIS — R262 Difficulty in walking, not elsewhere classified: Secondary | ICD-10-CM

## 2021-07-16 DIAGNOSIS — G8929 Other chronic pain: Secondary | ICD-10-CM

## 2021-07-16 DIAGNOSIS — M25552 Pain in left hip: Secondary | ICD-10-CM | POA: Diagnosis not present

## 2021-07-16 DIAGNOSIS — M6281 Muscle weakness (generalized): Secondary | ICD-10-CM

## 2021-07-16 NOTE — Therapy (Signed)
? ?OUTPATIENT PHYSICAL THERAPY TREATMENT NOTE ?  ?Recertification ? ?Patient Name: Hailey Fox ?MRN: [id number] ?DOB:[birth year], 81 y.o., female ?Today's Date: 07/16/2021 ? ?PCP: Chesley Noon, MD ?REFERRING PROVIDER: Chesley Noon, MD ? ? PT End of Session - 07/16/21 1003   ? ? Visit Number 21   ? Number of Visits 29   ? Date for PT Re-Evaluation 08/13/21   ? Authorization Type [employer] MCR   ? Progress Note Due on Visit 27   ? PT Start Time [phone]   ? PT Stop Time 1030   ? PT Time Calculation (min) 44 min   ? Activity Tolerance Patient tolerated treatment well   ? Behavior During Therapy [facility] for tasks assessed/performed   ? ?  ?  ? ?  ? ? ? ? ? ? ? ? ? ? ? ?Past Medical History:  ?Diagnosis Date  ? Allergy   ? Arthritis   ? Cancer [facility])   ? 3 skin cancers removed. 2 on legs, 1 from face  ? Cataract   ? bil  ? Complication of anesthesia   ? slow to wake up  ? Dysrhythmia   ? PAC's   ? GERD (gastroesophageal reflux disease)   ? Osteoporosis   ? PONV (postoperative nausea and vomiting)   ? ?Past Surgical History:  ?Procedure Laterality Date  ? APPENDECTOMY  early 22's  ? BREAST SURGERY Right 2003  ? palpiloma  ? HERNIA REPAIR    ? INGUINAL HERNIA REPAIR Left 08/18/2012  ? Procedure: LEFT INGUINAL HERNIA REPAIR;  Surgeon: Haywood Lasso, MD;  Location: [place];  Service: General;  Laterality: Left;  ? PARATHYROIDECTOMY  [id number]  ? TONSILLECTOMY  1945  ? TUBAL LIGATION  early 49's  ? ?Patient Active Problem List  ? Diagnosis Date Noted  ? 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 Heart palpitations 06/21/2020  ? PAC (premature atrial contraction) 06/21/2020  ? 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 Murmur 10/18/2019  ? Elevated blood pressure reading without diagnosis of hypertension 10/18/2019  ? Closed compression fracture of body of lumbar vertebra ([place]) 09/02/2017  ? Bilateral renal cysts 09/02/2017  ? Rotator cuff tear arthropathy of right shoulder  07/16/2017  ? Radiculopathy, lumbar region 03/18/2017  ? Degenerative tear of meniscus, left 02/13/2017  ? Piriformis syndrome of right side 02/04/2017  ? Degenerative tear of medial meniscus of right knee 07/26/2016  ? Left knee pain 07/10/2016  ? Chronic heel pain, left 03/28/2016  ? 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 Multiple allergies 04/11/2015  ? GERD (gastroesophageal reflux disease) 04/11/2015  ? Left inguinal hernia 05/26/2012  ? ? ?REFERRING DIAG: Left hip pain Left knee pain Left hip bursitis OA ? ?THERAPY DIAG:  ?Pain in left hip ?  ?Stiffness of left hip, not elsewhere classified ?  ?Chronic pain of left knee ?  ?Difficulty walking ?  ?Muscle weakness (generalized) ? ?PERTINENT HISTORY: History of compression fracture L1, osteoporosis  ? ?PRECAUTIONS: None ? ?SUBJECTIVE: "Almost fell the ? other day when I turned my head and my feet got tangled up.  I was able to catch myself I think because we practiced it here."   ? ?PAIN:  ?Are you having pain? Yes ?NPRS scale: 2/10  current left knee ?Pain location: lateral hip ?Pain orientation: Left  ?PAIN TYPE: aching and sharp ?Pain description: intermittent  ?Aggravating factors: descending hills  ?Relieving factors: resting ? ?PRECAUTIONS: None ? ?WEIGHT BEARING  RESTRICTIONS No ? ?FALLS:  ?Has patient fallen in last 6 months? No, Number of falls: 0 ? ?LIVING ENVIRONMENT: ?Lives with: lives with their family and lives with their spouse (caregiver to husband who is blind) ?Lives in: House/apartment ?Stairs: Yes; ? ? ?OCCUPATION: retired Environmental consultant ? ?PLOF: Independent ? ?PATIENT GOALS : Pt wants to be able to walk for exercise, social outings, and mental health relief.  ? ? ?OBJECTIVE:  *All findings taken at [facility] unless otherwise noted.  ? ?DIAGNOSTIC FINDINGS:  ? ?L knee US Impression:  Degenerative meniscus and joint space loss with moderate effusion of Left knee ? ? ?IMPRESSION: ?New mild compression deformity of the superior endplate of  vertebral ?body L1, concerning for acute fracture. No definite retropulsion ?seen. ?PATIENT SURVEYS:  ?LEFS 52/80 points (or 65.00%) ?Lower Extremity Functional Score: 55 / 80 = 68.8 % ?LEFS 07/02/21  39/80= 49% ? ?5XSTS:  10.6s   07/02/21 11.6 ? ? ?Berg 35/56 07/02/21 ?BERG Balance Test ?Transfers  3  ?Standing unsupported with eyes closed 2  ?Standing unsupported feet together 3  ?From standing position, reach forward with outstretched arm 3  ?From standing position, pick up object from floor 3  ?From standing position, turn and look behind over each shoulder 2  ?Turn 360? 1  ?Standing unsupported, alternately place foot on step 3  ?Standing unsupported, one foot in front 0  ?Standing on one leg 1  ?Total:  335/56  ? ?LE AROM/PROM: ?  ?A/PROM Right ?04/03/2021 Left ?04/03/2021 L 3/15  ?Hip flexion WFL 95 110  ?Hip extension WFL 5 10  ?Hip abduction WFL 25 30  ?Hip adduction       ?Hip internal rotation WFL in seated cross and uncross 15 30  ?Hip external rotation WFL in seated cross and uncross 30 45  ? (Blank rows = not tested) ?  ?LE MMT: ?  ?MMT Right ?04/03/2021 Left ?04/03/2021 L 3/15  ?Hip flexion 4+/5 4/5 4+/5  ?Hip extension 4+/5 4/5 4+/5  ?Hip abduction 4+/5 4/5 4+/5  ?Hip adduction 4+/5 4+/5 4+/5  ?Hip internal rotation 4+/5 4/5 4+/5  ?Hip external rotation 4+/5 4/5 4+/5  ? (Blank rows = not tested) ? ?PALPATION:  ?5/4: Pt is point tender at L greater trochanter, Lt glute med/min, and piriformis insertion ? ?TODAY'S TREATMENT: ? ?Pt seen for aquatic therapy today.  Treatment took place in water 3.25-4 ft in depth at the [facility] pool. Temp of water was 92?.  Pt entered/exited the pool via stairs step to/through pattern with bilat rail, independently. ? ? ?Walking without support - forward, back x 3 laps each, side stepping x 3 laps ?Forward and backward amb without UE support: head movements vertical  then horizontal multiple widths ?Seated: STS onto step from bench cues for weight shift and core  activation, improved posture ?Donned ankle buoys: ?Tandem stance multiple tries ?SLS multiple tries ?-Tandem hold R/L x20s; SLS rle best 13s ? Holding to wall: add/abd; knee flex; hip extension 2x10 ?   ? ? ?   ?Pt requires buoyancy for support and to offload joints with strengthening exercises. Viscosity of the water is needed for resistance of strengthening; water current perturbations provides challenge to standing balance unsupported, requiring increased core activation. ? ?PATIENT EDUCATION:  ?Education details:  exercise progression, DOMS expectations ?Person educated: Patient ?Education method: Explanation, Demonstration, Tactile cues, Verbal cues ?Education comprehension: verbalized understanding, returned demonstration, verbal cues required, and tactile cues required ? ? ?HOME EXERCISE PROGRAM: ?Access Code: [email or web address] ?  URL: [email or web address] ?Date: 04/30/2021 ?Prepared by: Daleen Bo ? ?Exercises ?Prone Quadriceps Stretch with Strap - 2 x daily - 7 x weekly - 1 sets - 3 reps - 30 hold ?Supine Bridge with Resistance Band - 2 x daily - 7 x weekly - 2 sets - 10 reps ?Seated Figure 4 Piriformis Stretch - 2 x daily - 7 x weekly - 1 sets - 3 reps - 30 hold ?Sitting Knee Extension with Resistance - 1 x daily - 3-4 x weekly - 3 sets - 10 reps ?Side Stepping with Resistance at Ankles - 1 x daily - 3-4 x weekly - 3 sets - 10 reps ? ?07/05/21: Verbally added supine ITB stretch with strap - gave visual demo ? ?ASSESSMENT: ? ?CLINICAL IMPRESSION: ?Used ankle buoys for added resistance with cues for controlled deceleration of all movements toward surface of pool with focus on left hip abd and quad strength.. Continued working on dynamic standing balance with head movements vertically and horizontally adding backward walking.  She reports backward walking with head movements recreated sensation she had with a near fall recently which she was able to control. Reduction in left knee pain to 0/10 upon  completion. Goals ongoing ? ? ? ?Objective impairments include Abnormal gait, decreased activity tolerance, decreased endurance, decreased mobility, difficulty walking, decreased ROM, decreased strength, hypomobility, increased mu

## 2021-07-18 ENCOUNTER — Encounter (HOSPITAL_BASED_OUTPATIENT_CLINIC_OR_DEPARTMENT_OTHER): Payer: Self-pay | Admitting: Physical Therapy

## 2021-07-18 ENCOUNTER — Ambulatory Visit (HOSPITAL_BASED_OUTPATIENT_CLINIC_OR_DEPARTMENT_OTHER): Payer: Medicare PPO | Admitting: Physical Therapy

## 2021-07-18 DIAGNOSIS — M6281 Muscle weakness (generalized): Secondary | ICD-10-CM

## 2021-07-18 DIAGNOSIS — M25552 Pain in left hip: Secondary | ICD-10-CM

## 2021-07-18 DIAGNOSIS — G8929 Other chronic pain: Secondary | ICD-10-CM

## 2021-07-18 DIAGNOSIS — R262 Difficulty in walking, not elsewhere classified: Secondary | ICD-10-CM

## 2021-07-18 NOTE — Therapy (Signed)
? ?OUTPATIENT PHYSICAL THERAPY TREATMENT NOTE ?  ?Recertification ? ?Patient Name: Hailey Fox ?MRN: [id number] ?DOB:September 13, 1940, 81 y.o., female ?Today's Date: 07/18/2021 ? ?PCP: Chesley Noon, MD ?REFERRING PROVIDER: Chesley Noon, MD ? ? PT End of Session - 07/18/21 1004   ? ? Visit Number 22   ? Number of Visits 29   ? Date for PT Re-Evaluation 08/13/21   ? Authorization Type [employer] MCR   ? Progress Note Due on Visit 27   ? PT Start Time 1003   ? PT Stop Time 1045   ? PT Time Calculation (min) 42 min   ? Activity Tolerance Patient tolerated treatment well   ? Behavior During Therapy [facility] for tasks assessed/performed   ? ?  ?  ? ?  ? ? ? ? ? ? ? ? ? ? ? ?Past Medical History:  ?Diagnosis Date  ? Allergy   ? Arthritis   ? Cancer [facility])   ? 3 skin cancers removed. 2 on legs, 1 from face  ? Cataract   ? bil  ? Complication of anesthesia   ? slow to wake up  ? Dysrhythmia   ? PAC's   ? GERD (gastroesophageal reflux disease)   ? Osteoporosis   ? PONV (postoperative nausea and vomiting)   ? ?Past Surgical History:  ?Procedure Laterality Date  ? APPENDECTOMY  early 40's  ? BREAST SURGERY Right 2003  ? palpiloma  ? HERNIA REPAIR    ? INGUINAL HERNIA REPAIR Left 08/18/2012  ? Procedure: LEFT INGUINAL HERNIA REPAIR;  Surgeon: Haywood Lasso, MD;  Location: [place];  Service: General;  Laterality: Left;  ? PARATHYROIDECTOMY  [id number]  ? TONSILLECTOMY  1945  ? TUBAL LIGATION  early 49's  ? ?Patient Active Problem List  ? Diagnosis Date Noted  ? 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 Heart palpitations 06/21/2020  ? PAC (premature atrial contraction) 06/21/2020  ? 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 Murmur 10/18/2019  ? Elevated blood pressure reading without diagnosis of hypertension 10/18/2019  ? Closed compression fracture of body of lumbar vertebra ([place]) 09/02/2017  ? Bilateral renal cysts 09/02/2017  ? Rotator cuff tear arthropathy of right shoulder  07/16/2017  ? Radiculopathy, lumbar region 03/18/2017  ? Degenerative tear of meniscus, left 02/13/2017  ? Piriformis syndrome of right side 02/04/2017  ? Degenerative tear of medial meniscus of right knee 07/26/2016  ? Left knee pain 07/10/2016  ? Chronic heel pain, left 03/28/2016  ? 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 Multiple allergies 04/11/2015  ? GERD (gastroesophageal reflux disease) 04/11/2015  ? Left inguinal hernia 05/26/2012  ? ? ?REFERRING DIAG: Left hip pain Left knee pain Left hip bursitis OA ? ?THERAPY DIAG:  ?Pain in left hip ?  ?Stiffness of left hip, not elsewhere classified ?  ?Chronic pain of left knee ?  ?Difficulty walking ?  ?Muscle weakness (generalized) ? ?PERTINENT HISTORY: History of compression fracture L1, osteoporosis  ? ?PRECAUTIONS: None ? ?SUBJECTIVE: " I feel like I'm getting better every day."  She reports that she was able to walk down the hill without pain in LLE, but was aware of LE.  She states she has pain in Lt lateral lower leg with stairs.  She reports need to work on balance and coordination.   ?PAIN:  ?Are you having pain? Yes ?NPRS scale: 1-2/10  current left knee ?Pain location: SI joint ?Pain orientation: bilat ?PAIN TYPE: aching  ?  Pain description: intermittent  ?Aggravating factors: descending hills  ?Relieving factors: resting ? ?PRECAUTIONS: None ? ?WEIGHT BEARING RESTRICTIONS No ? ?FALLS:  ?Has patient fallen in last 6 months? No, Number of falls: 0 ? ?LIVING ENVIRONMENT: ?Lives with: lives with their family and lives with their spouse (caregiver to husband who is blind) ?Lives in: House/apartment ?Stairs: Yes; ? ? ?OCCUPATION: retired Environmental consultant ? ?PLOF: Independent ? ?PATIENT GOALS : Pt wants to be able to walk for exercise, social outings, and mental health relief.  ? ? ?OBJECTIVE:  *All findings taken at [facility] unless otherwise noted.  ? ?DIAGNOSTIC FINDINGS:  ? ?L knee US Impression:  Degenerative meniscus and joint space loss with moderate effusion of  Left knee ? ? ?IMPRESSION: ?New mild compression deformity of the superior endplate of vertebral ?body L1, concerning for acute fracture. No definite retropulsion ?seen. ?PATIENT SURVEYS:  ?LEFS 52/80 points (or 65.00%) ?Lower Extremity Functional Score: 55 / 80 = 68.8 % ?LEFS 07/02/21  39/80= 49% ? ?5XSTS:  10.6s   07/02/21 11.6 ? ? ?Berg 35/56 07/02/21 ?BERG Balance Test ?Transfers  3  ?Standing unsupported with eyes closed 2  ?Standing unsupported feet together 3  ?From standing position, reach forward with outstretched arm 3  ?From standing position, pick up object from floor 3  ?From standing position, turn and look behind over each shoulder 2  ?Turn 360? 1  ?Standing unsupported, alternately place foot on step 3  ?Standing unsupported, one foot in front 0  ?Standing on one leg 1  ?Total:  335/56  ? ?LE AROM/PROM: ?  ?A/PROM Right ?04/03/2021 Left ?04/03/2021 L 3/15  ?Hip flexion WFL 95 110  ?Hip extension WFL 5 10  ?Hip abduction WFL 25 30  ?Hip adduction       ?Hip internal rotation WFL in seated cross and uncross 15 30  ?Hip external rotation WFL in seated cross and uncross 30 45  ? (Blank rows = not tested) ?  ?LE MMT: ?  ?MMT Right ?04/03/2021 Left ?04/03/2021 L 3/15  ?Hip flexion 4+/5 4/5 4+/5  ?Hip extension 4+/5 4/5 4+/5  ?Hip abduction 4+/5 4/5 4+/5  ?Hip adduction 4+/5 4+/5 4+/5  ?Hip internal rotation 4+/5 4/5 4+/5  ?Hip external rotation 4+/5 4/5 4+/5  ? (Blank rows = not tested) ? ?PALPATION:  ?5/4: Pt is point tender at L greater trochanter, Lt glute med/min, and piriformis insertion ? ?TODAY'S TREATMENT: ? ?Pt seen for aquatic therapy today.  Treatment took place in water 3.25-4 ft in depth at the [facility] pool. Temp of water was 92?.  Pt entered/exited the pool via stairs step to/through pattern with bilat rail, independently. ? ?Walking without support - forward, back x 2 laps each, side stepping x 2 laps ?Forward and backward amb without UE support: head movements vertical  then horizontal  multiple widths ?Tandem stance x 1 rep LLE in back 30 sec, x 2 RLE in back - up to 15 sec ?SLS - Rt multiple attempts, best 20 sec; Lt up to 30 sec ?TUG like exercise - x 6 reps, increased speed of walking. Cues for increased Rt hip flexion to match Lt ?Stairs with rail - (forward ascending / descending) 6 steps x 4 reps; cues for neutral lower leg (avoiding valgus) ?360? turns ?3 step and pivot ?Hurdle walking forward with yellow noodle ? ?   ?Pt requires buoyancy for support and to offload joints with strengthening exercises. Viscosity of the water is needed for resistance of strengthening; water  current perturbations provides challenge to standing balance unsupported, requiring increased core activation. ? ?PATIENT EDUCATION:  ?Education details:  exercise progression, DOMS expectations ?Person educated: Patient ?Education method: Explanation, Demonstration, Tactile cues, Verbal cues ?Education comprehension: verbalized understanding, returned demonstration, verbal cues required, and tactile cues required ? ? ?HOME EXERCISE PROGRAM: ?Access Code: [email or web address] ?URL: [email or web address] ?Date: 04/30/2021 ?Prepared by: Daleen Bo ? ?Exercises ?Prone Quadriceps Stretch with Strap - 2 x daily - 7 x weekly - 1 sets - 3 reps - 30 hold ?Supine Bridge with Resistance Band - 2 x daily - 7 x weekly - 2 sets - 10 reps ?Seated Figure 4 Piriformis Stretch - 2 x daily - 7 x weekly - 1 sets - 3 reps - 30 hold ?Sitting Knee Extension with Resistance - 1 x daily - 3-4 x weekly - 3 sets - 10 reps ?Side Stepping with Resistance at Ankles - 1 x daily - 3-4 x weekly - 3 sets - 10 reps ? ?07/05/21: Verbally added supine ITB stretch with strap - gave visual demo ? ?ASSESSMENT: ? ?CLINICAL IMPRESSION: ?Completed additional low scoring items from [place] today.  She has decreased balance with Rt SLS and Rt leg in back of tandem stance.  With increased speed of forward gait she doesn't drive her RLE into flexion as much as LLE;  improved with cues for even hip flexion in further trials.  She reports less Lt lower leg discomfort with stairs when focused on less valgus in alignment. No increase in Lt hip / knee pain during session.  Goal

## 2021-07-25 ENCOUNTER — Encounter (HOSPITAL_BASED_OUTPATIENT_CLINIC_OR_DEPARTMENT_OTHER): Payer: Self-pay | Admitting: Physical Therapy

## 2021-07-25 ENCOUNTER — Ambulatory Visit (HOSPITAL_BASED_OUTPATIENT_CLINIC_OR_DEPARTMENT_OTHER): Payer: Medicare PPO | Admitting: Physical Therapy

## 2021-07-25 DIAGNOSIS — M6281 Muscle weakness (generalized): Secondary | ICD-10-CM

## 2021-07-25 DIAGNOSIS — G8929 Other chronic pain: Secondary | ICD-10-CM

## 2021-07-25 DIAGNOSIS — M25552 Pain in left hip: Secondary | ICD-10-CM

## 2021-07-25 DIAGNOSIS — R262 Difficulty in walking, not elsewhere classified: Secondary | ICD-10-CM

## 2021-07-25 NOTE — Therapy (Signed)
OUTPATIENT PHYSICAL THERAPY TREATMENT NOTE   Recertification  Patient Name: Hailey Fox MRN: [id number] DOB:1941-01-11, 81 y.o., female Today's Date: 07/25/2021  PCP: Chesley Noon, MD REFERRING PROVIDER: Chesley Noon, MD   PT End of Session - 07/25/21 1021     Visit Number 23    Number of Visits 29    Date for PT Re-Evaluation 08/13/21    Authorization Type [employer] MCR    Progress Note Due on Visit 27    PT Start Time 1016    PT Stop Time 1057    PT Time Calculation (min) 41 min    Activity Tolerance Patient tolerated treatment well    Behavior During Therapy WFL for tasks assessed/performed             Past Medical History:  Diagnosis Date   Allergy    Arthritis    Cancer ([place])    3 skin cancers removed. 2 on legs, 1 from face   Cataract    bil   Complication of anesthesia    slow to wake up   Dysrhythmia    PAC's    GERD (gastroesophageal reflux disease)    Osteoporosis    PONV (postoperative nausea and vomiting)    Past Surgical History:  Procedure Laterality Date   APPENDECTOMY  early 80's   BREAST SURGERY Right 2003   palpiloma   HERNIA REPAIR     INGUINAL HERNIA REPAIR Left 08/18/2012   Procedure: LEFT INGUINAL HERNIA REPAIR;  Surgeon: Haywood Lasso, MD;  Location: [facility];  Service: General;  Laterality: Left;   PARATHYROIDECTOMY  [id number]   [place]  early 80's   Patient Active Problem List   Diagnosis Date Noted   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   Heart palpitations 06/21/2020   PAC (premature atrial contraction) 06/21/2020   Non-restorative sleep 06/21/2020   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   Murmur 10/18/2019   Elevated blood pressure reading without diagnosis of hypertension 10/18/2019   Closed compression fracture of body of lumbar vertebra ([place]) 09/02/2017   Bilateral renal cysts 09/02/2017   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    Radiculopathy, lumbar region 03/18/2017   Degenerative tear of meniscus, left 02/13/2017   Piriformis syndrome of right side 02/04/2017   Degenerative tear of medial meniscus of right knee 07/26/2016   Left knee pain 07/10/2016   Chronic heel pain, left 03/28/2016   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  She reports her Lt hip "is getting better.  It's been pretty good."  "Since I've been stretching, the hip hasn't hurt as bad."  PAIN:  Are you having pain? No NPRS scale: 0/10   Pain location:  Pain orientation:  PAIN TYPE:  Pain description: intermittent  Aggravating factors: descending hills  Relieving factors: resting  PRECAUTIONS: None  WEIGHT BEARING RESTRICTIONS No  FALLS:  Has patient fallen in last 6 months? No,  Number of falls: 0  LIVING ENVIRONMENT: Lives with: lives with their family and lives with their spouse (caregiver to husband who is blind) Lives in: House/apartment Stairs: Yes;   OCCUPATION: retired Environmental consultant  PLOF: Independent  PATIENT GOALS : Pt wants to be able to walk for exercise, social outings, and mental health relief.    OBJECTIVE:  *All findings taken at [facility] unless otherwise noted.   DIAGNOSTIC FINDINGS:   L knee US Impression:  Degenerative meniscus and joint space loss with moderate effusion of Left knee   IMPRESSION: New mild compression deformity of the superior endplate of vertebral body L1, concerning for acute  fracture. No definite retropulsion seen. PATIENT SURVEYS:  LEFS 52/80 points (or 65.00%) Lower Extremity Functional Score: 55 / 80 = 68.8 % LEFS 07/02/21  39/80= 49%  5XSTS:  10.6s   07/02/21 11.6   Berg 35/56 07/02/21 BERG Balance Test Transfers  3  Standing unsupported with eyes closed 2  Standing unsupported feet together 3  From standing position, reach forward with outstretched arm 3  From standing position, pick up object from floor 3  From standing position, turn and look behind over each shoulder 2  Turn 360 1  Standing unsupported, alternately place foot on step 3  Standing unsupported, one foot in front 0  Standing on one leg 1  Total:  335/56   LE AROM/PROM:   A/PROM Right 04/03/2021 Left 04/03/2021 L 3/15  Hip flexion WFL 95 110  Hip extension WFL 5 10  Hip abduction WFL 25 30  Hip adduction       Hip internal rotation WFL in seated cross and uncross 15 30  Hip external rotation WFL in seated cross and uncross 30 45   (Blank rows = not tested)   LE MMT:   MMT Right 04/03/2021 Left 04/03/2021 L 3/15  Hip flexion 4+/5 4/5 4+/5  Hip extension 4+/5 4/5 4+/5  Hip abduction 4+/5 4/5 4+/5  Hip adduction 4+/5 4+/5 4+/5  Hip internal rotation 4+/5 4/5 4+/5  Hip external rotation 4+/5 4/5 4+/5   (Blank rows = not tested)  PALPATION:  5/4: Pt is point tender at L greater trochanter, Lt glute med/min, and piriformis insertion  TODAY'S TREATMENT:  Pt seen for aquatic therapy today.  Treatment took place in water 3.25-4 ft in depth at the [facility] pool. Temp of water was 92.  Pt entered/exited the pool via stairs step to/through pattern with bilat rail, independently. (Pt stretched L/R quads while sitting on bench on deck prior to session)  Walking without support - forward, back x 4 laps each  - cues for even step length side stepping x 4 laps Rt SLS - up to 10 sec.     SLS with opp foot taps front, side, back x 5 reps, 2 sets each LE Tandem stance Rt  leg in back x 25 sec 5 steps forward with pivot TUG like exercise - x 4 reps, increased speed of walking. Cues for increased Rt hip flexion to match Lt Toe taps  to blue step without UE support x 8, 3 sets 360 turns Rt SLS up to 18 sec Tandem gait forward/ backward Hurdle walking forward with yellow noodle Seated piriformis stretch     Pt requires buoyancy for support and to offload joints with strengthening exercises. Viscosity of the water is needed for resistance of strengthening; water current perturbations provides challenge to standing balance unsupported, requiring increased core activation.  PATIENT EDUCATION:  Education  details:  exercise progression, DOMS expectations Person educated: Patient Education method: Explanation, Demonstration, Tactile cues, Verbal cues Education comprehension: verbalized understanding, returned demonstration, verbal cues required, and tactile cues required   HOME EXERCISE PROGRAM: Access Code: [email or web address] URL: [email or web address] Date: 04/30/2021 Prepared by: Daleen Bo  Exercises Prone Quadriceps Stretch with Strap - 2 x daily - 7 x weekly - 1 sets - 3 reps - 30 hold Supine Bridge with Resistance Band - 2 x daily - 7 x weekly - 2 sets - 10 reps Seated Figure 4 Piriformis Stretch - 2 x daily - 7 x weekly - 1 sets - 3 reps - 30 hold Sitting Knee Extension with Resistance - 1 x daily - 3-4 x weekly - 3 sets - 10 reps Side Stepping with Resistance at Ankles - 1 x daily - 3-4 x weekly - 3 sets - 10 reps  07/05/21: Verbally added supine ITB stretch with strap - gave visual demo  ASSESSMENT:  CLINICAL IMPRESSION: Continued to complete low scoring items from [place] today.  Continued decreased balance with Rt SLS and Rt leg in back of tandem stance.  No increase in Lt hip / knee pain during session.  Progressing well towards remaining goals.   Objective impairments include Abnormal gait, decreased activity tolerance, decreased endurance,  decreased mobility, difficulty walking, decreased ROM, decreased strength, hypomobility, increased muscle spasms, impaired flexibility, improper body mechanics, postural dysfunction, and pain. These impairments are limiting patient from cleaning, community activity, driving, meal prep, laundry, yard work, shopping, and exercise and caregiver duties. Personal factors including Age, Fitness, Past/current experiences, Time since onset of injury/illness/exacerbation, and 1-2 comorbidities:  are also affecting patient's functional outcome. Patient will benefit from skilled PT to address above impairments and improve overall function.  REHAB POTENTIAL: Good  CLINICAL DECISION MAKING: Stable/uncomplicated  EVALUATION COMPLEXITY: Low   GOALS:   SHORT TERM GOALS:  STG Name Target Date Goal status  1 Pt will become independent with HEP in order to demonstrate synthesis of PT education.  Baseline:  05/15/2021 Achieved  2 Pt will report at least 2 pt reduction on NPRS scale for pain in order to demonstrate functional improvement with household activity, self care, and ADL.  Baseline:  05/15/2021 Achieved  3 Pt will be able to demonstrate STS without UE in order to demonstrate functional improvement in LE function for self-care and house hold duties.  Baseline: 05/15/2021 Achieved  4 Pt will be able to demonstrate/report ability to walk >15 mins without pain in order to demonstrate functional improvement and tolerance to exercise and community mobility.  Baseline: 05/15/2021 Achieved   LONG TERM GOALS:   LTG Name Target Date Goal status  1 Pt  will become independent with final HEP in order to demonstrate synthesis of PT education.  Baseline: 06/26/2021 Partially met  2 Pt will have an at least 18 pt improvement in LEFS measure in order to demonstrate MCID improvement in daily function.  06/26/2021 Partially met  3 Pt will be able to perform 5XSTS in under 12s  in order to demonstrate functional  improvement above the cut off score for adults.  Baseline: 06/26/2021 Achieved  4 Pt will be able to demonstrate reciprocal stair management in order to demonstrate functional improvement in LE function for self-care and house hold mobility.  Baseline: 06/26/2021 Partially met  5 Pt will tolerate walking x 1 mile 3 times weekly without increase in left hip pain. Baseline: 1.5 miles 1 x weekly with significant increase in left hip pain  08/13/2021 New  6 Pt will improve on Berg Balance Test to 45/56 or > to demonstrate decreased fall risk. Baseline: 35/56 08/13/2021 New   PLAN: PT FREQUENCY: 1-2x/week  PT DURATION: 6 weeks  PLANNED INTERVENTIONS: Therapeutic exercises, Therapeutic activity, Neuro Muscular re-education, Balance training, Gait training, Patient/Family education, Joint mobilization, Stair training, Prosthetic training, DME instructions, Aquatic Therapy, Dry Needling, Electrical stimulation, Spinal mobilization, Cryotherapy, Moist heat, Taping, Vasopneumatic device, Traction, Ultrasound, Ionotophoresis 15m/ml Dexamethasone, and Manual therapy  PLAN FOR NEXT SESSION: R hip ABD strength, standing balance, L quad strength; RLE balance. Plan to retest Berg in future session.   JKerin Perna PTA 07/25/21 10:22 AM

## 2021-07-27 ENCOUNTER — Ambulatory Visit (HOSPITAL_BASED_OUTPATIENT_CLINIC_OR_DEPARTMENT_OTHER): Payer: Medicare PPO | Admitting: Physical Therapy

## 2021-07-27 ENCOUNTER — Encounter (HOSPITAL_BASED_OUTPATIENT_CLINIC_OR_DEPARTMENT_OTHER): Payer: Self-pay | Admitting: Physical Therapy

## 2021-07-27 DIAGNOSIS — G8929 Other chronic pain: Secondary | ICD-10-CM

## 2021-07-27 DIAGNOSIS — R262 Difficulty in walking, not elsewhere classified: Secondary | ICD-10-CM

## 2021-07-27 DIAGNOSIS — M25552 Pain in left hip: Secondary | ICD-10-CM

## 2021-07-27 DIAGNOSIS — M6281 Muscle weakness (generalized): Secondary | ICD-10-CM

## 2021-07-27 NOTE — Therapy (Signed)
OUTPATIENT PHYSICAL THERAPY TREATMENT NOTE   Recertification  Patient Name: Hailey Fox MRN: [id number] DOB:Oct 18, 1940, 81 y.o., female Today's Date: 07/27/2021  PCP: Chesley Noon, MD REFERRING PROVIDER: Chesley Noon, MD   PT End of Session - 07/27/21 0935     Visit Number 24    Number of Visits 29    Date for PT Re-Evaluation 08/13/21    Authorization Type [employer] MCR    Progress Note Due on Visit 27    PT Start Time 0932    PT Stop Time 1015    PT Time Calculation (min) 43 min    Activity Tolerance Patient tolerated treatment well    Behavior During Therapy WFL for tasks assessed/performed              Past Medical History:  Diagnosis Date   Allergy    Arthritis    Cancer ([place])    3 skin cancers removed. 2 on legs, 1 from face   Cataract    bil   Complication of anesthesia    slow to wake up   Dysrhythmia    PAC's    GERD (gastroesophageal reflux disease)    Osteoporosis    PONV (postoperative nausea and vomiting)    Past Surgical History:  Procedure Laterality Date   APPENDECTOMY  early 80's   BREAST SURGERY Right 2003   palpiloma   HERNIA REPAIR     INGUINAL HERNIA REPAIR Left 08/18/2012   Procedure: LEFT INGUINAL HERNIA REPAIR;  Surgeon: Haywood Lasso, MD;  Location: [facility];  Service: General;  Laterality: Left;   PARATHYROIDECTOMY  [id number]   [place]  early 80's   Patient Active Problem List   Diagnosis Date Noted   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   Heart palpitations 06/21/2020   PAC (premature atrial contraction) 06/21/2020   Non-restorative sleep 06/21/2020   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   Murmur 10/18/2019   Elevated blood pressure reading without diagnosis of hypertension 10/18/2019   Closed compression fracture of body of lumbar vertebra ([place]) 09/02/2017   Bilateral renal cysts 09/02/2017   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    Radiculopathy, lumbar region 03/18/2017   Degenerative tear of meniscus, left 02/13/2017   Piriformis syndrome of right side 02/04/2017   Degenerative tear of medial meniscus of right knee 07/26/2016   Left knee pain 07/10/2016   Chronic heel pain, left 03/28/2016   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 No NPRS scale: 0/10   Pain location:  Pain orientation:  PAIN TYPE:  Pain description: intermittent  Aggravating factors: descending hills  Relieving factors: resting  PRECAUTIONS: None  WEIGHT BEARING RESTRICTIONS No  FALLS:  Has patient fallen in last 6 months? No, Number of falls: 0  LIVING ENVIRONMENT: Lives with: lives with their family and lives  with their spouse (caregiver to husband who is blind) Lives in: House/apartment Stairs: Yes;   OCCUPATION: retired Environmental consultant  PLOF: Independent  PATIENT GOALS : Pt wants to be able to walk for exercise, social outings, and mental health relief.    OBJECTIVE:  *All findings taken at [facility] unless otherwise noted.   DIAGNOSTIC FINDINGS:   L knee US Impression:  Degenerative meniscus and joint space loss with moderate effusion of Left knee   IMPRESSION: New mild compression deformity of the superior endplate of vertebral body L1, concerning for acute fracture. No definite retropulsion seen. PATIENT SURVEYS:  LEFS 52/80 points (or  65.00%) Lower Extremity Functional Score: 55 / 80 = 68.8 % LEFS 07/02/21  39/80= 49%  5XSTS:  10.6s   07/02/21 11.6   Berg 35/56 07/02/21 BERG Balance Test Transfers  3  Standing unsupported with eyes closed 2  Standing unsupported feet together 3  From standing position, reach forward with outstretched arm 3  From standing position, pick up object from floor 3  From standing position, turn and look behind over each shoulder 2  Turn 360 1  Standing unsupported, alternately place foot on step 3  Standing unsupported, one foot in front 0  Standing on one leg 1  Total:  335/56   LE AROM/PROM:   A/PROM Right 04/03/2021 Left 04/03/2021 L 3/15  Hip flexion WFL 95 110  Hip extension WFL 5 10  Hip abduction WFL 25 30  Hip adduction       Hip internal rotation WFL in seated cross and uncross 15 30  Hip external rotation WFL in seated cross and uncross 30 45   (Blank rows = not tested)   LE MMT:   MMT Right 04/03/2021 Left 04/03/2021 L 3/15  Hip flexion 4+/5 4/5 4+/5  Hip extension 4+/5 4/5 4+/5  Hip abduction 4+/5 4/5 4+/5  Hip adduction 4+/5 4+/5 4+/5  Hip internal rotation 4+/5 4/5 4+/5  Hip external rotation 4+/5 4/5 4+/5   (Blank rows = not tested)  PALPATION:  5/4: Pt is point tender at L greater trochanter, Lt glute med/min, and piriformis insertion  TODAY'S TREATMENT:  Pt seen for aquatic therapy today.  Treatment took place in water 3.25-4 ft in depth at the [facility] pool. Temp of water was 92.  Pt entered/exited the pool via stairs step to/through pattern with bilat rail, independently.  Walking without support - forward, back x 4 laps each   side stepping x 4 laps Seated stretching hamstrings, glut/piriformis and hamstring; quads standing Rt SLS - up to 8 sec.     SLS with opp foot taps front, side, back x 5 reps, 2 sets each LE 5 steps forward with pivot x 8 Hurdle walking forward with yellow noodle x 4 widths Monster walk with noodle x 4 widths  (with growl;) Toe taps bottom step without UE support x 8, 3 sets    Pt requires buoyancy for support and to offload joints with strengthening exercises. Viscosity of the water is needed for resistance of strengthening; water current perturbations provides challenge to standing balance unsupported, requiring increased core activation.  PATIENT EDUCATION:  Education details:  exercise progression, DOMS expectations Person educated: Patient Education method: Explanation, Demonstration, Tactile cues, Verbal cues Education comprehension: verbalized understanding, returned demonstration, verbal cues required, and tactile cues required   HOME EXERCISE PROGRAM: Access Code: [email or web address] URL: [email or web address] Date: 04/30/2021 Prepared by: Daleen Bo  Exercises Prone Quadriceps Stretch with Strap - 2 x daily -  7 x weekly - 1 sets - 3 reps - 30 hold Supine Bridge with Resistance Band - 2 x daily - 7 x weekly - 2 sets - 10 reps Seated Figure 4 Piriformis Stretch - 2 x daily - 7 x weekly - 1 sets - 3 reps - 30 hold Sitting Knee Extension with Resistance - 1 x daily - 3-4 x weekly - 3 sets - 10 reps Side Stepping with Resistance at Ankles - 1 x daily - 3-4 x weekly - 3 sets - 10 reps  07/05/21: Verbally added supine ITB stretch with strap - gave visual demo  ASSESSMENT:  CLINICAL IMPRESSION: Pt weepy today "not on my game". Directed pt through balance challenges similar to last visit.  She continues to struggle with SLS balance on R. Added hand buoys allow for improved ability. She demos climbing stairs alternating LE in and out of pool without difficulty meeting LTG.  I do believe she is reaching her maximal potential. She is in agreement with dc after next visit. May be a candidate for Aquatic Group therapy. Will discuss next visit.  Objective impairments include Abnormal gait, decreased activity tolerance, decreased endurance, decreased mobility, difficulty walking, decreased ROM,  decreased strength, hypomobility, increased muscle spasms, impaired flexibility, improper body mechanics, postural dysfunction, and pain. These impairments are limiting patient from cleaning, community activity, driving, meal prep, laundry, yard work, shopping, and exercise and caregiver duties. Personal factors including Age, Fitness, Past/current experiences, Time since onset of injury/illness/exacerbation, and 1-2 comorbidities:  are also affecting patient's functional outcome. Patient will benefit from skilled PT to address above impairments and improve overall function.  REHAB POTENTIAL: Good  CLINICAL DECISION MAKING: Stable/uncomplicated  EVALUATION COMPLEXITY: Low   GOALS:   SHORT TERM GOALS:  STG Name Target Date Goal status  1 Pt will become independent with HEP in order to demonstrate synthesis of PT education.  Baseline:  05/15/2021 Achieved  2 Pt will report at least 2 pt reduction on NPRS scale for pain in order to demonstrate functional improvement with household activity, self care, and ADL.  Baseline:  05/15/2021 Achieved  3 Pt will be able to demonstrate STS without UE in order to demonstrate functional improvement in LE function for self-care and house hold duties.  Baseline: 05/15/2021 Achieved  4 Pt will be able to demonstrate/report ability to walk >15 mins without pain in order to demonstrate functional improvement and tolerance to exercise and community mobility.  Baseline: 05/15/2021 Achieved   LONG TERM GOALS:   LTG Name Target Date Goal status  1 Pt  will become independent with final HEP in order to demonstrate synthesis of PT education.  Baseline: 06/26/2021 Partially met  2 Pt will have an at least 18 pt improvement in LEFS measure in order to demonstrate MCID improvement in daily function.  06/26/2021 Partially met  3 Pt will be able to perform 5XSTS in under 12s  in order to demonstrate functional improvement above the cut off score for  adults.  Baseline: 06/26/2021 Achieved  4 Pt will be able to demonstrate reciprocal stair management in order to demonstrate functional improvement in LE function for self-care and house hold mobility.  Baseline: 06/26/2021 Achieved  5 Pt will tolerate walking x 1 mile 3 times weekly without increase in left hip pain. Baseline: 1.5 miles 1 x weekly with significant increase in left hip pain 08/13/2021 New  6 Pt will improve on Berg Balance Test to 45/56 or > to demonstrate decreased fall risk. Baseline: 35/56  08/13/2021 New   PLAN: PT FREQUENCY: 1-2x/week  PT DURATION: 6 weeks  PLANNED INTERVENTIONS: Therapeutic exercises, Therapeutic activity, Neuro Muscular re-education, Balance training, Gait training, Patient/Family education, Joint mobilization, Stair training, Prosthetic training, DME instructions, Aquatic Therapy, Dry Needling, Electrical stimulation, Spinal mobilization, Cryotherapy, Moist heat, Taping, Vasopneumatic device, Traction, Ultrasound, Ionotophoresis 4mg /ml Dexamethasone, and Manual therapy  PLAN FOR NEXT SESSION: R hip ABD strength, standing balance, L quad strength; RLE balance. Plan to retest Berg in future session.   Stanton Kidney Tharon Aquas) Rocio Wolak MPT 07/27/21 10:24 AM

## 2021-07-31 ENCOUNTER — Ambulatory Visit (HOSPITAL_BASED_OUTPATIENT_CLINIC_OR_DEPARTMENT_OTHER): Payer: Medicare PPO | Admitting: Physical Therapy

## 2021-07-31 ENCOUNTER — Encounter (HOSPITAL_BASED_OUTPATIENT_CLINIC_OR_DEPARTMENT_OTHER): Payer: Self-pay | Admitting: Physical Therapy

## 2021-07-31 DIAGNOSIS — M6281 Muscle weakness (generalized): Secondary | ICD-10-CM

## 2021-07-31 DIAGNOSIS — R262 Difficulty in walking, not elsewhere classified: Secondary | ICD-10-CM

## 2021-07-31 DIAGNOSIS — M25552 Pain in left hip: Secondary | ICD-10-CM

## 2021-07-31 DIAGNOSIS — M25562 Pain in left knee: Secondary | ICD-10-CM

## 2021-07-31 NOTE — Therapy (Addendum)
OUTPATIENT PHYSICAL THERAPY TREATMENT NOTE   PHYSICAL THERAPY DISCHARGE SUMMARY  Visits from Start of Care: 25  Current functional level related to goals / functional outcomes: Indep with ADL's and functional mobility   Remaining deficits: Age related fall risk   Education / Equipment: Management of condition;HEP   Patient agrees to discharge. Patient goals were partially met. Patient is being discharged due to being pleased with the current functional level.   Patient Name: Hailey Fox MRN: [id number] DOB:12-20-1940, 81 y.o., female Today's Date: 07/31/2021  PCP: Chesley Noon, MD REFERRING PROVIDER: Chesley Noon, MD   PT End of Session - 07/31/21 1103     Visit Number 25    Number of Visits 29    Date for PT Re-Evaluation 08/13/21    Authorization Type [employer] MCR    Progress Note Due on Visit 27    PT Start Time 1100    PT Stop Time 1140    PT Time Calculation (min) 40 min    Activity Tolerance Patient tolerated treatment well    Behavior During Therapy WFL for tasks assessed/performed              Past Medical History:  Diagnosis Date   Allergy    Arthritis    Cancer ([place])    3 skin cancers removed. 2 on legs, 1 from face   Cataract    bil   Complication of anesthesia    slow to wake up   Dysrhythmia    PAC's    GERD (gastroesophageal reflux disease)    Osteoporosis    PONV (postoperative nausea and vomiting)    Past Surgical History:  Procedure Laterality Date   APPENDECTOMY  early 80's   BREAST SURGERY Right 2003   palpiloma   HERNIA REPAIR     INGUINAL HERNIA REPAIR Left 08/18/2012   Procedure: LEFT INGUINAL HERNIA REPAIR;  Surgeon: Haywood Lasso, MD;  Location: [facility];  Service: General;  Laterality: Left;   PARATHYROIDECTOMY  [id number]   [place]  early 80's   Patient Active Problem List   Diagnosis Date Noted   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   Heart palpitations 06/21/2020    PAC (premature atrial contraction) 06/21/2020   Non-restorative sleep 06/21/2020   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   Murmur 10/18/2019   Elevated blood pressure reading without diagnosis of hypertension 10/18/2019   Closed compression fracture of body of lumbar vertebra ([place]) 09/02/2017   Bilateral renal cysts 09/02/2017   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   Radiculopathy, lumbar region 03/18/2017   Degenerative tear of meniscus, left 02/13/2017   Piriformis syndrome of right side 02/04/2017   Degenerative tear of medial meniscus of right knee 07/26/2016   Left knee pain 07/10/2016   Chronic heel pain, left 03/28/2016   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  She verbalizes readiness to d/c today.  PAIN:  Are you having pain? No NPRS scale: 0/10   Pain location:  Pain orientation:  PAIN TYPE:  Pain description: intermittent  Aggravating factors: descending hills  Relieving factors: resting  PRECAUTIONS: None  WEIGHT BEARING RESTRICTIONS No  FALLS:  Has patient fallen in last 6 months? No, Number of falls: 0  LIVING ENVIRONMENT: Lives with: lives with their family and lives with their spouse (caregiver to husband who is blind) Lives in: House/apartment Stairs: Yes;   OCCUPATION: retired Environmental consultant  PLOF: Independent  PATIENT GOALS : Pt wants to be able to walk for exercise, social outings, and mental  health relief.    OBJECTIVE:  *All findings taken at [facility] unless otherwise noted.   DIAGNOSTIC FINDINGS:   L knee US Impression:  Degenerative meniscus and joint space loss with moderate effusion of Left knee   IMPRESSION: New mild compression deformity of the superior endplate of vertebral body L1, concerning for acute fracture. No definite retropulsion seen. PATIENT SURVEYS:  LEFS 52/80 points (or 65.00%) Lower Extremity Functional Score: 55 / 80 = 68.8 % LEFS 07/02/21  39/80= 49%  5XSTS:  10.6s   07/02/21 11.6   Berg 35/56 07/02/21 BERG Balance Test Transfers  3  Standing unsupported with eyes closed 2  Standing unsupported feet together 3  From standing position, reach forward with outstretched arm 3  From standing position, pick up object from floor 3  From standing position, turn and look behind over each shoulder 2  Turn 360 1  Standing unsupported, alternately place foot on step 3  Standing unsupported, one foot in front 0  Standing on one leg 1  Total:  335/56    BERG Balance Test          Date: 07/31/21  Sit to Stand 4  Standing unsupported 4  Sitting with back unsupported but feet supported 4  Stand to sit  4  Transfers  4  Standing unsupported with eyes closed 4  Standing unsupported feet together 4  From standing position, reach forward with outstretched arm 3  From standing position, pick up object from floor 4  From standing position, turn and look behind over each shoulder 3  Turn 360 4  Standing unsupported, alternately place foot on step 4  Standing unsupported, one foot in front 2  Standing on one leg 3  Total:  51/56    LE AROM/PROM:   A/PROM Right 04/03/2021 Left 04/03/2021 L 3/15  Hip flexion WFL 95 110  Hip extension WFL 5 10  Hip abduction WFL 25 30  Hip adduction       Hip internal rotation WFL in seated cross and uncross 15 30  Hip external rotation WFL in seated cross and uncross 30 45   (Blank rows = not tested)   LE MMT:    MMT Right 04/03/2021 Left 04/03/2021 L 3/15  Hip flexion 4+/5 4/5 4+/5  Hip extension 4+/5 4/5 4+/5  Hip abduction 4+/5 4/5 4+/5  Hip adduction 4+/5 4+/5 4+/5  Hip internal rotation 4+/5 4/5 4+/5  Hip external rotation 4+/5 4/5 4+/5   (Blank rows = not tested)  PALPATION:  5/4: Pt is point tender at L greater trochanter, Lt glute med/min, and piriformis insertion  TODAY'S TREATMENT:  Pt seen for aquatic therapy today.  Treatment took place in water 3.25-4 ft in depth at the [facility] pool. Temp of water was 92.  Pt entered/exited the pool via stairs step to/through pattern with bilat rail, independently.  Walking without support - forward, back x 4 laps each   side stepping x 4 laps - 2 with rainbow hand buoys with arm abdct/add Hurdle walking forward with yellow noodle x 4 widths; backward x 1 width (challenging) Monster walk forward / backward  Stork stance with opp LE IR/ER x 5 reps each leg (challenge for R SLS, utilized intermittent UE support) x 2 sets Braiding R/L  Seated stretching hamstrings, glut/piriformis; quads with LE supported by square noodle in standing     Pt requires buoyancy for support and to offload joints with strengthening exercises. Viscosity of the water is needed for resistance of strengthening; water current perturbations provides challenge to standing balance unsupported, requiring increased core activation.  PATIENT EDUCATION:  Education details:  exercise progression Person educated: Patient Education method: Explanation, Demonstration, Tactile cues, Verbal cues Education comprehension: verbalized understanding, returned demonstration, verbal cues required, and tactile cues required   HOME EXERCISE PROGRAM: Access Code: [email or web address] URL: [email or web address] Date: 04/30/2021 Prepared by: Daleen Bo  Exercises Prone Quadriceps Stretch with Strap - 2 x daily - 7 x weekly - 1 sets - 3 reps - 30 hold Supine Bridge with  Resistance Band - 2 x daily - 7 x weekly - 2 sets - 10 reps Seated Figure 4 Piriformis Stretch - 2 x daily - 7 x weekly - 1 sets - 3 reps - 30 hold Sitting Knee Extension with Resistance - 1 x daily - 3-4 x weekly - 3 sets - 10 reps Side Stepping with Resistance at Ankles - 1 x daily - 3-4 x weekly - 3 sets - 10 reps  07/05/21: Verbally added supine ITB stretch with strap - gave visual demo  ASSESSMENT:  CLINICAL IMPRESSION: Pt demonstrated improved BERG; has met this goal.  She tolerated all exercises without production of pain.  She has met all but one remaining goals and verbalized readiness to d/c to HEP at this time. She is pleased with current level of function.   Objective impairments include Abnormal gait, decreased activity tolerance, decreased endurance, decreased mobility, difficulty walking, decreased ROM, decreased strength, hypomobility, increased muscle spasms, impaired flexibility, improper body mechanics, postural dysfunction, and pain. These impairments are limiting patient from cleaning, community activity, driving, meal prep, laundry, yard work, shopping, and exercise and caregiver duties. Personal factors including Age, Fitness, Past/current experiences, Time since onset of injury/illness/exacerbation, and 1-2 comorbidities:  are also affecting patient's functional outcome. Patient will benefit from skilled PT to address above impairments and improve overall function.  REHAB POTENTIAL: Good  CLINICAL DECISION MAKING: Stable/uncomplicated  EVALUATION COMPLEXITY: Low   GOALS:   SHORT TERM GOALS:  STG Name Target Date Goal status  1 Pt will become independent with HEP in order to demonstrate synthesis of PT education.  Baseline:  05/15/2021 Achieved  2 Pt will report at least 2 pt reduction on NPRS scale for pain in order to demonstrate functional improvement with household activity, self care, and ADL.  Baseline:  05/15/2021 Achieved  3 Pt will be able to demonstrate  STS without UE in order to demonstrate functional improvement in LE function for self-care and house hold duties.  Baseline: 05/15/2021 Achieved  4 Pt will be able to demonstrate/report ability to walk >15 mins without pain in order to demonstrate functional improvement and tolerance to exercise and community mobility.  Baseline: 05/15/2021 Achieved   LONG TERM GOALS:   LTG Name Target Date Goal status  1 Pt  will become independent with final HEP in order to  demonstrate synthesis of PT education.  Baseline: 06/26/2021 Achieved   2 Pt will have an at least 18 pt improvement in LEFS measure in order to demonstrate MCID improvement in daily function.  06/26/2021 Partially met  3 Pt will be able to perform 5XSTS in under 12s  in order to demonstrate functional improvement above the cut off score for adults.  Baseline: 06/26/2021 Achieved  4 Pt will be able to demonstrate reciprocal stair management in order to demonstrate functional improvement in LE function for self-care and house hold mobility.  Baseline: 06/26/2021 Achieved  5 Pt will tolerate walking x 1 mile 3 times weekly without increase in left hip pain. Baseline: 1.5 miles 1 x weekly with significant increase in left hip pain 08/13/2021 Achieved  6 Pt will improve on Berg Balance Test to 45/56 or > to demonstrate decreased fall risk. Baseline: 35/56 08/13/2021 Achieved   PLAN: PT FREQUENCY: 1-2x/week  PT DURATION: 6 weeks  PLANNED INTERVENTIONS: Therapeutic exercises, Therapeutic activity, Neuro Muscular re-education, Balance training, Gait training, Patient/Family education, Joint mobilization, Stair training, Prosthetic training, DME instructions, Aquatic Therapy, Dry Needling, Electrical stimulation, Spinal mobilization, Cryotherapy, Moist heat, Taping, Vasopneumatic device, Traction, Ultrasound, Ionotophoresis 4mg /ml Dexamethasone, and Manual therapy  PLAN FOR NEXT SESSION: d/c  Kerin Perna, PTA 07/31/21 12:42  PM   I am in agreement with above. Addended 07/31/21 1:14 PM Stanton Kidney Tharon Aquas) [place] MPT

## 2021-08-27 ENCOUNTER — Encounter: Payer: Self-pay | Admitting: Student

## 2021-08-27 ENCOUNTER — Ambulatory Visit: Payer: Medicare PPO | Admitting: Student

## 2021-08-27 VITALS — BP 123/66 | HR 63 | Temp 97.9°F | Resp 16 | Ht 62.0 in | Wt 122.0 lb

## 2021-08-27 DIAGNOSIS — I351 Nonrheumatic aortic (valve) insufficiency: Secondary | ICD-10-CM

## 2021-08-27 DIAGNOSIS — I340 Nonrheumatic mitral (valve) insufficiency: Secondary | ICD-10-CM

## 2021-08-27 DIAGNOSIS — I471 Supraventricular tachycardia: Secondary | ICD-10-CM

## 2021-08-27 DIAGNOSIS — I441 Atrioventricular block, second degree: Secondary | ICD-10-CM

## 2021-09-23 ENCOUNTER — Other Ambulatory Visit: Payer: Self-pay

## 2021-09-23 ENCOUNTER — Emergency Department (HOSPITAL_BASED_OUTPATIENT_CLINIC_OR_DEPARTMENT_OTHER)
Admission: EM | Admit: 2021-09-23 | Discharge: 2021-09-23 | Disposition: A | Discharge status: Home / Self Care | Payer: Medicare PPO | Attending: Emergency Medicine | Admitting: Emergency Medicine

## 2021-09-23 ENCOUNTER — Emergency Department (HOSPITAL_BASED_OUTPATIENT_CLINIC_OR_DEPARTMENT_OTHER): Payer: Medicare PPO

## 2021-09-23 ENCOUNTER — Encounter (HOSPITAL_BASED_OUTPATIENT_CLINIC_OR_DEPARTMENT_OTHER): Payer: Self-pay | Admitting: Obstetrics and Gynecology

## 2021-09-23 DIAGNOSIS — M25511 Pain in right shoulder: Secondary | ICD-10-CM | POA: Insufficient documentation

## 2021-09-23 DIAGNOSIS — W19XXXA Unspecified fall, initial encounter: Secondary | ICD-10-CM

## 2021-09-23 DIAGNOSIS — W01198A Fall on same level from slipping, tripping and stumbling with subsequent striking against other object, initial encounter: Secondary | ICD-10-CM | POA: Diagnosis not present

## 2021-09-23 DIAGNOSIS — M79631 Pain in right forearm: Secondary | ICD-10-CM | POA: Diagnosis not present

## 2021-09-23 NOTE — ED Notes (Signed)
Dc instructions reviewed with patient. Patient voiced understanding. Dc with belongings. Shoulder immobilizer placed and pt able to voice understanding.

## 2021-09-23 NOTE — ED Provider Notes (Signed)
[facility]ERG[facility] DEPT Provider Note   CSN: [id number] Arrival date & time: 09/23/21  1315     History  Chief Complaint  Patient presents with   Fall   Shoulder Injury    Hailey Fox is a 81 y.o. female.   Fall  Shoulder Injury    81 year old female presenting to the emergency department with right forearm pain and right shoulder pain after a mechanical ground-level fall.  The patient states that she slipped and fell onto a rock outside working.  She denies any head trauma, loss of consciousness.  She is not on anticoagulation.  She struck her right forearm onto rocks.  She has had pain and developed a hematoma in the right forearm.  She denies any other injuries or complaints.  Her tetanus is up-to-date.  She arrived GCS 15, ABC intact.  Home Medications Prior to Admission medications   Medication Sig Start Date End Date Taking? Authorizing Provider  acetaminophen (TYLENOL) 500 MG tablet Take 500 mg by mouth 2 (two) times daily.    [provider]  azelastine (ASTELIN) 0.1 % nasal spray Place 1 spray into both nostrils as needed. 10/09/20   [provider]  Biotin 1 MG CAPS Take 1 mg by mouth daily.    [provider]  Calcium Carbonate-Vit D-Min (CALTRATE 600+D PLUS MINERALS) 600-800 MG-UNIT TABS Take 2 tablets by mouth daily.    [provider]  Cholecalciferol (VITAMIN D-3 PO) Take 2,000 mg by mouth daily.    [provider]  Coenzyme Q10 (COQ10 PO) Take 10 mLs by mouth daily after breakfast.    [provider]  denosumab (PROLIA) 60 MG/ML SOSY injection Inject into the skin every 6 (six) months. 08/11/18   [provider]  diclofenac Sodium (VOLTAREN) 1 % GEL Apply 1 application topically as needed. 02/27/21   [provider]  KRILL OIL PO Take 1 tablet by mouth daily.    [provider]  melatonin 5 MG TABS Take by mouth.    [provider]  mometasone (ELOCON)  0.1 % lotion daily as needed (for face). 08/16/19   [provider]  PREMARIN vaginal cream  08/09/19   [provider]  rosuvastatin (CRESTOR) 10 MG tablet Take 1 tablet by mouth daily. 05/08/20   [provider]      Allergies    Codeine, Contrast media [iodinated contrast media], Macrodantin [nitrofurantoin macrocrystal], Ciprofloxacin, Iohexol, and Tramadol    Review of Systems   Review of Systems  All other systems reviewed and are negative.   Physical Exam Updated Vital Signs BP (!) 160/73 (BP Location: Left Arm)   Pulse 63   Temp 97.7 F (36.5 C) (Oral)   Resp 17   Ht '5\' 2"'$  (1.575 m)   Wt 55.1 kg   SpO2 99%   BMI 22.22 kg/m  Physical Exam Vitals and nursing note reviewed.  Constitutional:      General: She is not in acute distress.    Appearance: She is well-developed.     Comments: GCS 15, ABC intact  HENT:     Head: Normocephalic and atraumatic.  Eyes:     Extraocular Movements: Extraocular movements intact.     Conjunctiva/sclera: Conjunctivae normal.     Pupils: Pupils are equal, round, and reactive to light.  Neck:     Comments: No midline tenderness to palpation of the cervical spine.  Range of motion intact Cardiovascular:     Rate and Rhythm: Normal  rate and regular rhythm.     Heart sounds: No murmur heard. Pulmonary:     Effort: Pulmonary effort is normal. No respiratory distress.     Breath sounds: Normal breath sounds.  Chest:     Comments: Clavicles stable nontender to AP compression.  Chest wall stable and nontender to AP and lateral compression. Abdominal:     Palpations: Abdomen is soft.     Tenderness: There is no abdominal tenderness.  Musculoskeletal:     Cervical back: Neck supple.     Comments: No midline tenderness to palpation of the thoracic or lumbar spine.  Ecchymosis and mild tenderness to palpation of the proximal forearm.  No significant tenderness of the right shoulder, some pain with attempts at range of  motion.  Range of motion intact of the right shoulder passively.  2+ radial pulses, intact motor function along the median, ulnar, radial nerve distributions.  No tenderness to palpation about the clavicle or AC joint.  Skin:    General: Skin is warm and dry.  Neurological:     Mental Status: She is alert.     Comments: Cranial nerves II through XII grossly intact.  Moving all 4 extremities spontaneously.  Sensation grossly intact all 4 extremities     ED Results / Procedures / Treatments   Labs (all labs ordered are listed, but only abnormal results are displayed) Labs Reviewed - No data to display  EKG None  Radiology DG Shoulder Right  Result Date: 09/23/2021 CLINICAL DATA:  Fall.  Shoulder pain.  Landed on rock. EXAM: RIGHT SHOULDER - 2+ VIEW COMPARISON:  Right shoulder radiographs 07/17/2017 FINDINGS: There is diffuse decreased bone mineralization. Mild glenohumeral joint space narrowing and inferior glenoid degenerative osteophytosis. Mild acromioclavicular joint space narrowing and peripheral osteophytosis. Mild-to-moderate distal lateral subacromial spurring. No acute fracture is seen. No dislocation. The visualized portion of the right lung is unremarkable. 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 Mild to moderate distal lateral subacromial spurring. No acute fracture. Electronically Signed   By: Yvonne Kendall M.D.   On: 09/23/2021 14:34   DG Forearm Right  Result Date: 09/23/2021 CLINICAL DATA:  Fall.  Right forearm pain. EXAM: RIGHT FOREARM - 2 VIEW COMPARISON:  None Available. FINDINGS: There is no evidence of fracture or other focal bone lesions. Soft tissues are unremarkable. IMPRESSION: Negative. Electronically Signed   By: Kerby Moors M.D.   On: 09/23/2021 14:09    Procedures Procedures    Medications Ordered in ED Medications - No data to display  ED Course/ Medical Decision Making/ A&P                            Medical Decision Making Amount and/or Complexity of Data Reviewed Radiology: ordered.   81 year old female presenting to the emergency department with right forearm pain and right shoulder pain after a mechanical ground-level fall.  The patient states that she slipped and fell onto a rock outside working.  She denies any head trauma, loss of consciousness.  She is not on anticoagulation.  She struck her right forearm onto rocks.  She has had pain and developed a hematoma in the right forearm.  She denies any other injuries or complaints.  Her tetanus is up-to-date.  She arrived GCS 15, ABC intact.  No midline tenderness to palpation of the thoracic or lumbar spine.  Ecchymosis and mild tenderness to palpation of the proximal forearm.  No significant tenderness of the  right shoulder, some pain with attempts at range of motion.  Range of motion intact of the right shoulder passively.  2+ radial pulses, intact motor function along the median, ulnar, radial nerve distributions.  No tenderness to palpation about the clavicle or AC joint.  Patient without head trauma, neurologically intact.  Not on anticoagulation.  C-spine cleared by Nexus criteria.  X-ray imaging of the right shoulder was performed and negative for acute fracture or dislocation.  Concern potentially for ligamentous injury given patient's mild pain with range of motion.  X-ray imaging of the right forearm also performed and negative for acute fracture.  Patient placed in a shoulder sling for comfort and advised to follow-up outpatient in 1 week.  She has previously seen [facility] and plans to follow-up with them.  Referral placed.  Advised NSAIDs and Tylenol for pain control, rest, ice, elevation of the affected extremity.  Stable for discharge.  DC Instructions: Please follow-up outpatient with orthopedics.  Wear shoulder sling as needed for comfort.  You may need further outpatient MRI imaging to evaluate for possible  ligamentous injury.  There was no evidence of forearm fracture or fracture or dislocation of the shoulder on exam or imaging.  Recommend Tylenol and ibuprofen for pain control.  Final Clinical Impression(s) / ED Diagnoses Final diagnoses:  Fall, initial encounter  Acute pain of right shoulder    Rx / DC Orders ED Discharge Orders          Ordered    AMB referral to orthopedics        09/23/21 1443              Regan Lemming, MD 09/23/21 1453

## 2021-09-23 NOTE — ED Triage Notes (Signed)
Patient reports she was outside and heard her dog whining and turned around and her foot caught, causing her to fall and hit her right shoulder and forearm on the rocks

## 2021-09-23 NOTE — Discharge Instructions (Addendum)
Please follow-up outpatient with orthopedics.  Wear shoulder sling as needed for comfort.  You may need further outpatient MRI imaging to evaluate for possible ligamentous injury.  There was no evidence of forearm fracture or fracture or dislocation of the shoulder on exam or imaging.  Recommend Tylenol and ibuprofen for pain control.

## 2021-10-01 ENCOUNTER — Ambulatory Visit: Payer: Self-pay

## 2021-10-01 ENCOUNTER — Ambulatory Visit: Payer: Medicare PPO | Admitting: Family Medicine

## 2021-10-01 VITALS — BP 132/76 | Ht 62.0 in | Wt 115.0 lb

## 2021-10-01 DIAGNOSIS — M25511 Pain in right shoulder: Secondary | ICD-10-CM

## 2021-10-01 NOTE — Progress Notes (Unsigned)
PCP: Chesley Noon, MD  Subjective:   HPI: Patient is a 81 y.o. female here for right shoulder pain.  Patient fell 8 days ago in her yard and landed on her right shoulder. She has difficulty reaching overhead or to the side. Holding a glass of water in her hand is painful. She has tried ibuprofen for pain relief. She iced her shoulder for the first 24 hours and then used heat for pain. She is left handed. Referred here by Dr. Layne Benton for shoulder ultrasound.  Past Medical History:  Diagnosis Date   Allergy    Arthritis    Cancer ([place])    3 skin cancers removed. 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     azelastine (ASTELIN) 0.1 % nasal spray Place 1 spray into both nostrils as needed.     Biotin 1 MG CAPS Take 1 mg by mouth daily.     Calcium Carbonate-Vit D-Min (CALTRATE 600+D PLUS MINERALS) 600-800 MG-UNIT TABS Take 2 tablets by mouth daily.     Cholecalciferol (VITAMIN D-3 PO) Take 2,000 mg by mouth daily.     Coenzyme Q10 (COQ10 PO) Take 10 mLs by mouth daily after breakfast.     denosumab (PROLIA) 60 MG/ML SOSY injection Inject into the skin every 6 (six) months.     diclofenac Sodium (VOLTAREN) 1 % GEL Apply 1 application topically as needed.     KRILL OIL PO Take 1 tablet by mouth daily.     melatonin 5 MG TABS Take by mouth.     mometasone (ELOCON) 0.1 % lotion daily as needed (for face).     PREMARIN vaginal cream      rosuvastatin (CRESTOR) 10 MG tablet Take 1 tablet by mouth daily.     No current facility-administered medications on file prior to visit.    Past Surgical History:  Procedure Laterality Date    APPENDECTOMY  early 59's   BREAST SURGERY Right 2003   palpiloma   HERNIA REPAIR     INGUINAL HERNIA REPAIR Left 08/18/2012   Procedure: LEFT INGUINAL HERNIA REPAIR;  Surgeon: Haywood Lasso, MD;  Location: [facility];  Service: General;  Laterality: Left;   PARATHYROIDECTOMY  [id number]   TONSILLECTOMY  1945   TUBAL LIGATION  early 80's    Allergies  Allergen Reactions   Codeine     blackouts   Contrast Media [Iodinated Contrast Media] Hives   Macrodantin [Nitrofurantoin Macrocrystal]    Ciprofloxacin     Other reaction(s): Tachycardia / Palpitations  (intolerance) Rapid pulse   Iohexol      Desc: IMMEDIATE DEVELOPMENT OF HIVES POST INJECTION    Tramadol Anxiety and Other (See Comments)    Weakness.    BP 132/76   Ht '5\' 2"'$  (1.575 m)   Wt 115 lb (52.2 kg)   BMI 21.03 kg/m       No data to display              No data to display  Objective:  Physical Exam:  Gen: NAD, comfortable in exam room Shoulder, right: No  skin changes, erythema, or ecchymosis noted. No evidence of bony deformity, asymmetry, or muscle atrophy; No tenderness over long head of biceps (bicipital groove). No TTP at [facility] joint. Full active and passive range of motion though painful (180 flex Huel Cote /150Abd /90ER /70IR), Thumb to T12 without significant tenderness. Strength 5/5 throughout.   Special Tests:   - Empty can: Positive   - Int/Ext Rotation test: NEG   - Hawkins: NEG   - Neer test: NEG   - O'brien's test: NEG   - Speeds test: NEG   Complete MSK u/s right shoulder: Biceps tendon: short head intact.  Long head appears disrupted at musculotendinous junction with large cystic structure here Pec major tendon: intact Subscapularis: hypoechoic change within tendon but no obvious tear AC joint: moderate arthropathy with geyser sign/effusion Infraspinatus: inferior portion of tendon appears intact - superior aspect disrupted Supraspinatus: full thickness full width  retracted tear with high riding humeral head.  Impaction fracture noted of humeral head as well impacted about 38m, fracture 8.628macross.  Mod-severe subacromial bursitis. Posterior glenohumeral joint: paralabral cyst, joint effusion.  At least mild arthropathy.  Impression:  Humeral head impaction fracture - about 64m29mepression Remote full thickness full width supraspinatus tear with high riding humeral head Full thickness partial width tear of superior aspect of infraspinatus Moderate-severe subacromial bursitis. Long head biceps tendon tear at musculotendinous junction, likely remote Moderate AC arthropathy with effusion Glenohumeral joint effusion with paralabral cyst, at least mild arthropathy.   Assessment & Plan:  1. Right shoulder pain likely 2/2 small impaction fracture of the proximal humerus. The patient also has infraspinatus, supraspinatus, and proximal biceps tendon tear that appear old and unrelated to the fall. Continue with home exercises. Follow up with Dr BasLayne Bentonr further treatment.   SteVirgel ManifoldS4

## 2021-10-01 NOTE — Patient Instructions (Signed)
You have a small impaction fracture of your proximal humerus. You also have rotator cuff tears (supraspinatus and infraspinatus) and proximal biceps tendon tears but these appear old and not related to this fall. Continue motion exercises. Keep follow-up with Dr. Layne Benton as scheduled for further treatment going forward.

## 2021-10-02 ENCOUNTER — Encounter: Payer: Self-pay | Admitting: Family Medicine

## 2022-02-27 ENCOUNTER — Ambulatory Visit: Payer: Medicare PPO | Admitting: Cardiology

## 2022-03-06 ENCOUNTER — Encounter: Payer: Self-pay | Admitting: Cardiology

## 2022-03-06 ENCOUNTER — Ambulatory Visit: Payer: Medicare PPO | Admitting: Cardiology

## 2022-03-06 VITALS — BP 132/66 | HR 75 | Ht 62.0 in | Wt 124.2 lb

## 2022-03-06 DIAGNOSIS — I471 Supraventricular tachycardia, unspecified: Secondary | ICD-10-CM

## 2022-03-06 NOTE — Progress Notes (Signed)
Follow up visit  Subjective:   Hailey Fox, female    DOB: 1940/03/13, 82 y.o.   MRN: [id number]     HPI  Chief Complaint  Patient presents with   Paroxysmal SVT (supraventricular tachycardia    82 y.o. y.o. Caucasian female with hypertension, hyperlipidemia, former smoker, PSVT  Patient is doing well, denies chest pain, shortness of breath, palpitations, leg edema, orthopnea, PND, TIA/syncope.  Blood pressure is very well-controlled.     Current Outpatient Medications:    acetaminophen (TYLENOL) 500 MG tablet, Take 500 mg by mouth 2 (two) times daily., Disp: , Rfl:    azelastine (ASTELIN) 0.1 % nasal spray, Place 1 spray into both nostrils as needed., Disp: , Rfl:    Biotin 1 MG CAPS, Take 1 mg by mouth daily., Disp: , Rfl:    Calcium Carbonate-Vit D-Min (CALTRATE 600+D PLUS MINERALS) 600-800 MG-UNIT TABS, Take 2 tablets by mouth daily., Disp: , Rfl:    Cholecalciferol (VITAMIN D-3 PO), Take 2,000 mg by mouth daily., Disp: , Rfl:    Coenzyme Q10 (COQ10 PO), Take 10 mLs by mouth daily after breakfast., Disp: , Rfl:    denosumab (PROLIA) 60 MG/ML SOSY injection, Inject into the skin every 6 (six) months., Disp: , Rfl:    diclofenac Sodium (VOLTAREN) 1 % GEL, Apply 1 application topically as needed., Disp: , Rfl:    KRILL OIL PO, Take 1 tablet by mouth daily., Disp: , Rfl:    melatonin 5 MG TABS, Take by mouth., Disp: , Rfl:    mometasone (ELOCON) 0.1 % lotion, daily as needed (for face)., Disp: , Rfl:    PREMARIN vaginal cream, , Disp: , Rfl:    rosuvastatin (CRESTOR) 10 MG tablet, Take 1 tablet by mouth daily., Disp: , Rfl:    Cardiovascular & other pertient studies:  Reviewed external labs and tests, independently interpreted  EKG 03/06/2022: Sinus rhythm 73 bpm.   Baseline artifact, otherwise normal EKG.  Ambulatory cardiac telemetry 7 days (03/13/2021 - 03/20/2021): Predominant underlying rhythm was sinus with first-degree AV block.  Patient had episodes of  supraventricular tachycardia as well as occasional PACs and PVCs.  Patient with intermittent episodes of Mobitz type I and Mobitz type II AV block at night and asymptomatic.  Patient with 2 to 3-second pauses which were both asymptomatic.  Patient's symptoms correlated withNormal sinus rhythm, and PACs/PVCs.  No evidence of atrial fibrillation or ventricular tachycardia.  Echocardiogram 06/08/2020: Left ventricle cavity is normal in size and wall thickness. Normal global wall motion. Normal LV systolic function with EF 66%. Doppler evidence of grade I (impaired) diastolic dysfunction, normal LAP. Structurally normal trileaflet aortic valve. Mild (Grade I) aortic regurgitation. Mild (Grade I) mitral regurgitation. Mild tricuspid regurgitation. Estimated pulmonary artery systolic pressure 29 mmHg. No significant change compared to previous study on 10/25/2019.   Lexiscan Tetrofosmin stress test 06/05/2020: Lexiscan nuclear stress test performed using 1-day protocol. Stress EKG is non-diagnostic for ischemia as it's a pharmacologic stress test using Lexiscan. Normal myocardial perfusion. Mild decrease in tracer uptake in apical septal myocardium both at rest and stress likely due to tissue attenuation.  STress LVEF 69%. Low risk study.  Recent labs: Not available   Review of Systems  Cardiovascular:  Negative for chest pain, dyspnea on exertion, leg swelling, palpitations and syncope.         Vitals:   03/06/22 1313 03/06/22 1324  BP: (!) 145/74 132/66  Pulse: 77 75  SpO2: 98% 98%    Body  mass index is 22.72 kg/m. Filed Weights   03/06/22 1313  Weight: 124 lb 3.2 oz (56.3 kg)    Objective:   Physical Exam Vitals and nursing note reviewed.  Constitutional:      General: She is not in acute distress. Neck:     Vascular: No JVD.  Cardiovascular:     Rate and Rhythm: Normal rate and regular rhythm.     Heart sounds: Normal heart sounds. No murmur heard. Pulmonary:      Effort: Pulmonary effort is normal.     Breath sounds: Normal breath sounds. No wheezing or rales.  Musculoskeletal:     Right lower leg: No edema.     Left lower leg: No edema.             Visit diagnoses:   ICD-10-CM   1. Paroxysmal SVT (supraventricular tachycardia)  I47.10 EKG 12-Lead       Orders Placed This Encounter  Procedures   EKG 12-Lead     Assessment & Recommendations:   82 y.o.  y.o. Caucasian female with hypertension, hyperlipidemia, former smoker, PSVT  No recent recurrence of palpitations.  Continue conservative management and clinical monitoring.  Follow-up in 1 year.      Nigel Mormon, MD Pager: [phone] Office: [phone]

## 2022-03-08 ENCOUNTER — Encounter: Payer: Self-pay | Admitting: Cardiology

## 2022-06-17 ENCOUNTER — Other Ambulatory Visit: Payer: Self-pay

## 2022-06-17 ENCOUNTER — Encounter (HOSPITAL_BASED_OUTPATIENT_CLINIC_OR_DEPARTMENT_OTHER): Payer: Self-pay

## 2022-06-17 ENCOUNTER — Emergency Department (HOSPITAL_BASED_OUTPATIENT_CLINIC_OR_DEPARTMENT_OTHER): Payer: Medicare PPO

## 2022-06-17 ENCOUNTER — Emergency Department (HOSPITAL_BASED_OUTPATIENT_CLINIC_OR_DEPARTMENT_OTHER)
Admission: EM | Admit: 2022-06-17 | Discharge: 2022-06-17 | Disposition: A | Discharge status: Home / Self Care | Payer: Medicare PPO | Attending: Emergency Medicine | Admitting: Emergency Medicine

## 2022-06-17 DIAGNOSIS — R1011 Right upper quadrant pain: Secondary | ICD-10-CM | POA: Diagnosis present

## 2022-06-17 DIAGNOSIS — K8050 Calculus of bile duct without cholangitis or cholecystitis without obstruction: Secondary | ICD-10-CM

## 2022-06-17 DIAGNOSIS — Z85828 Personal history of other malignant neoplasm of skin: Secondary | ICD-10-CM | POA: Diagnosis not present

## 2022-06-17 LAB — COMPREHENSIVE METABOLIC PANEL
ALT: 12 U/L (ref 0–44)
AST: 22 U/L (ref 15–41)
Albumin: 4.4 g/dL (ref 3.5–5.0)
Alkaline Phosphatase: 35 U/L — ABNORMAL LOW (ref 38–126)
Anion gap: 12 (ref 5–15)
BUN: 20 mg/dL (ref 8–23)
CO2: 26 mmol/L (ref 22–32)
Calcium: 10.5 mg/dL — ABNORMAL HIGH (ref 8.9–10.3)
Chloride: 102 mmol/L (ref 98–111)
Creatinine, Ser: 0.92 mg/dL (ref 0.44–1.00)
GFR, Estimated: >60 mL/min (ref >60)
Glucose, Bld: 85 mg/dL (ref 70–99)
Potassium: 3.7 mmol/L (ref 3.5–5.1)
Sodium: 140 mmol/L (ref 135–145)
Total Bilirubin: 0.6 mg/dL (ref 0.3–1.2)
Total Protein: 6.7 g/dL (ref 6.5–8.1)

## 2022-06-17 LAB — URINALYSIS, ROUTINE W REFLEX MICROSCOPIC
Bilirubin Urine: NEGATIVE
Glucose, UA: NEGATIVE mg/dL
Hgb urine dipstick: NEGATIVE
Ketones, ur: NEGATIVE mg/dL
Leukocytes,Ua: NEGATIVE
Nitrite: NEGATIVE
Protein, ur: NEGATIVE mg/dL
Specific Gravity, Urine: 1.005 (ref 1.005–1.030)
pH: 7 (ref 5.0–8.0)

## 2022-06-17 LAB — CBC
HCT: 38 % (ref 36.0–46.0)
Hemoglobin: 12.7 g/dL (ref 12.0–15.0)
MCH: 29.1 pg (ref 26.0–34.0)
MCHC: 33.4 g/dL (ref 30.0–36.0)
MCV: 87 fL (ref 80.0–100.0)
Platelets: 226 10*3/uL (ref 150–400)
RBC: 4.37 MIL/uL (ref 3.87–5.11)
RDW: 13.5 % (ref 11.5–15.5)
WBC: 5.4 10*3/uL (ref 4.0–10.5)
nRBC: 0 % (ref 0.0–0.2)

## 2022-06-17 LAB — LIPASE, BLOOD: Lipase: 51 U/L (ref 11–51)

## 2022-06-17 MED ORDER — PANTOPRAZOLE SODIUM 20 MG PO TBEC: 20.0000 mg | DELAYED_RELEASE_TABLET | Freq: Every day | ORAL | 0 refills | Status: AC

## 2022-06-17 NOTE — ED Notes (Signed)
Reviewed AVS/discharge instruction with patient. Time allotted for and all questions answered. Patient is agreeable for d/c and escorted to ed exit by staff.  

## 2022-06-17 NOTE — ED Provider Notes (Signed)
[place] EMERGENCY DEPARTMENT AT [facility] [place] Provider Note   CSN: [id number] Arrival date & time: 06/17/22  1303     History  Chief Complaint  Patient presents with   Fatigue   Nausea   Abdominal Pain    Hailey Fox is a 82 y.o. female.  HPI     82 year old female with a history of skin cancer, GERD, prolapse, presents with concern for abdominal pain.  Reports that during and after dinner last night, she developed severe abdominal pain, initially was diffuse and severe and then settled in the right upper quadrant.  Reports that the pain continued throughout the evening until just before she went to bed.  Reports that it eased off a little bit before bed and she was able to sleep.  She has not had any abdominal pain today.  She had some nausea on the way here, but otherwise has not had any nausea, vomiting.  Reports normal bowel movements, no diarrhea or constipation.  Reports she has had increased urination, increased frequency.  Reports she has chronic mild amount of burning which she suspects is related to her prolapse.  Denies known fevers.  Denies chest pain, shortness of breath.  She has had a history of tubal ligation, hernia repairs in the past.  No history of cholecystectomy.  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 Authorizing Provider  acetaminophen (TYLENOL) 500 MG tablet Take 500 mg by mouth 2 (two) times daily.    [provider]  azelastine (ASTELIN) 0.1 % nasal spray Place 1 spray into both nostrils as needed. 10/09/20   [provider]  Biotin 1 MG CAPS  Take 1 mg by mouth daily.    [provider]  Calcium Carbonate-Vit D-Min (CALTRATE 600+D PLUS MINERALS) 600-800 MG-UNIT TABS Take 2 tablets by mouth daily.    [provider]  Cholecalciferol (VITAMIN D-3 PO) Take 2,000 mg by mouth daily.    [provider]  Coenzyme Q10 (COQ10 PO) Take 10 mLs by mouth daily after breakfast.    [provider]  denosumab (PROLIA) 60 MG/ML SOSY injection Inject into the skin every 6 (six) months. 08/11/18   [provider]  diclofenac Sodium (VOLTAREN) 1 % GEL Apply 1 application topically as needed. 02/27/21   [provider]  KRILL OIL PO Take 1 tablet by mouth daily.    [provider]  melatonin 5 MG TABS Take by mouth.    [provider]  mometasone (ELOCON) 0.1 % lotion daily as needed (for face). 08/16/19   [provider]  PREMARIN vaginal cream  08/09/19   [provider]  rosuvastatin (CRESTOR) 10 MG tablet Take 1 tablet by mouth daily. 05/08/20   [provider]      Allergies    Codeine, Iodinated contrast media, Macrodantin [nitrofurantoin macrocrystal], Ciprofloxacin, Iohexol, and Tramadol    Review of Systems   Review of Systems  Physical Exam Updated Vital Signs BP (!) 159/88  Pulse (!) 58   Temp 98.2 F (36.8 C) (Oral)   Resp 18   Ht  (1.575 m)   Wt 59 kg   SpO2 98%   BMI 23.78 kg/m  Physical Exam Vitals and nursing note reviewed.  Constitutional:      General: She is not in acute distress.    Appearance: She is well-developed. She is not diaphoretic.  HENT:     Head: Normocephalic and atraumatic.  Eyes:     Conjunctiva/sclera: Conjunctivae normal.  Cardiovascular:     Rate and Rhythm: Normal rate and regular rhythm.     Heart sounds: Normal heart sounds. No murmur heard.    No friction rub. No gallop.  Pulmonary:     Effort: Pulmonary effort is normal. No respiratory distress.     Breath sounds: Normal breath sounds. No  wheezing or rales.  Abdominal:     General: There is no distension.     Palpations: Abdomen is soft.     Tenderness: There is no abdominal tenderness. There is no guarding.  Musculoskeletal:        General: No tenderness.     Cervical back: Normal range of motion.  Skin:    General: Skin is warm and dry.     Findings: No erythema or rash.  Neurological:     Mental Status: She is alert and oriented to person, place, and time.     ED Results / Procedures / Treatments   Labs (all labs ordered are listed, but only abnormal results are displayed) Labs Reviewed  COMPREHENSIVE METABOLIC PANEL - Abnormal; Notable for the following components:      Result Value   Calcium 10.5 (*)    Alkaline Phosphatase 35 (*)    All other components within normal limits  URINALYSIS, ROUTINE W REFLEX MICROSCOPIC - Abnormal; Notable for the following components:   Color, Urine COLORLESS (*)    All other components within normal limits  LIPASE, BLOOD  CBC    EKG EKG Interpretation  Date/Time:  Monday June 17 2022 13:17:39 EDT Ventricular Rate:  68 PR Interval:  213 QRS Duration: 103 QT Interval:  438 QTC Calculation: 466 R Axis:   36 Text Interpretation: Sinus rhythm Borderline prolonged PR interval No significant change since last tracing Confirmed by Alvira Monday ([id number]) on 06/17/2022 2:59:26 PM  Radiology No results found.  Procedures Procedures    Medications Ordered in ED Medications - No data to display  ED Course/ Medical Decision Making/ A&P                              82 year old female with a history of skin cancer, GERD, prolapse, presents with concern for abdominal pain.  DDx includes appendicitis, pancreatitis, cholecystitis, pyelonephritis, nephrolithiasis, diverticulitis, SBO, AAA.  She has normal pulses in the bilateral lower extremities, low suspicion for dissection.  No chest pain or shortness of breath.  EKG completed and personally about interpreted by me  shows no evidence of acute abnormalities.  Labs completed and personally evaluated interpreted by me show no transaminitis, no sign of pancreatitis, mildly elevated calcium similar to February 2022, no leukocytosis or anemia.  Urinalysis returned showing no signs of urinary tract infection.  History sounds most consistent with episode of biliary colic.  Ordered right upper quadrant ultrasound for further evaluation.  She is not currently having abdominal pain, nausea or vomiting and have low suspicion for SBO, AAA, nephrolithiasis, diverticulitis  or appendicitis.  Discussed that she has cholelithiasis without signs of cholecystitis will recommend follow-up with general surgery.  If her ultrasound does not show acute abnormalities and she remains asymptomatic, feel she can be discharged with strict return precautions and PCP follow-up.  Signed out to Dr. Wallace Cullens with ultrasound pending.        Final Clinical Impression(s) / ED Diagnoses Final diagnoses:  Right upper quadrant abdominal pain    Rx / DC Orders ED Discharge Orders     None         Alvira Monday, MD 06/17/22 1540

## 2022-06-17 NOTE — ED Triage Notes (Addendum)
Patient arrives ambulatory to ED with complaints of right side abdominal pain, nausea, and fatigue that started overnight. Reports frequent urination overnight as well.  No pain on arrival.

## 2022-06-17 NOTE — Discharge Instructions (Addendum)
It was a pleasure caring for you today in the emergency department. ° °Please return to the emergency department for any worsening or worrisome symptoms. ° ° °

## 2022-06-17 NOTE — ED Provider Notes (Signed)
  Provider Note MRN:  [id number]  Arrival date & time: 06/17/22    ED Course and Medical Decision Making  Assumed care from [facility] at shift change.  See note from prior team for complete details, in brief:  82 yo female RUQ/epig pain post prandial last PM Resolved Labs stable US/UA pending If these are wnl can likely be dc'd  Plan per prior physician f/u as above [place] w/ biliary distension but no cholecystitis or gallstones  UA w/o overt infection  RUQ pain minimal at this time  Tolerating PO  Recommend bland diet/ppi/outpatient gen surg f/u  The patient improved significantly and was discharged in stable condition. Detailed discussions were had with the patient regarding current findings, and need for close f/u with PCP or on call doctor. The patient has been instructed to return immediately if the symptoms worsen in any way for re-evaluation. Patient verbalized understanding and is in agreement with current care plan. All questions answered prior to discharge.      Procedures  Final Clinical Impressions(s) / ED Diagnoses     ICD-10-CM   1. Right upper quadrant abdominal pain  R10.11     2. Biliary colic  K80.50       ED Discharge Orders          Ordered    pantoprazole (PROTONIX) 20 MG tablet  Daily        06/17/22 1631              Discharge Instructions      It was a pleasure caring for you today in the emergency department.  Please return to the emergency department for any worsening or worrisome symptoms.          Sloan Leiter, DO 06/17/22 1753

## 2023-03-07 ENCOUNTER — Ambulatory Visit: Payer: Self-pay | Admitting: Cardiology

## 2023-03-11 ENCOUNTER — Ambulatory Visit: Payer: Medicare PPO | Admitting: Cardiology

## 2024-02-29 ENCOUNTER — Encounter (HOSPITAL_BASED_OUTPATIENT_CLINIC_OR_DEPARTMENT_OTHER): Payer: Self-pay

## 2024-02-29 ENCOUNTER — Emergency Department (HOSPITAL_BASED_OUTPATIENT_CLINIC_OR_DEPARTMENT_OTHER)
Admission: EM | Admit: 2024-02-29 | Discharge: 2024-02-29 | Disposition: A | Discharge status: Home / Self Care | Attending: Emergency Medicine | Admitting: Emergency Medicine

## 2024-02-29 ENCOUNTER — Emergency Department (HOSPITAL_BASED_OUTPATIENT_CLINIC_OR_DEPARTMENT_OTHER): Admitting: Radiology

## 2024-02-29 DIAGNOSIS — B349 Viral infection, unspecified: Secondary | ICD-10-CM | POA: Diagnosis not present

## 2024-02-29 DIAGNOSIS — R059 Cough, unspecified: Secondary | ICD-10-CM | POA: Diagnosis present

## 2024-02-29 DIAGNOSIS — M47814 Spondylosis without myelopathy or radiculopathy, thoracic region: Secondary | ICD-10-CM | POA: Insufficient documentation

## 2024-02-29 DIAGNOSIS — M8008XA Age-related osteoporosis with current pathological fracture, vertebra(e), initial encounter for fracture: Secondary | ICD-10-CM | POA: Diagnosis not present

## 2024-02-29 DIAGNOSIS — J3489 Other specified disorders of nose and nasal sinuses: Secondary | ICD-10-CM | POA: Diagnosis not present

## 2024-02-29 LAB — RESP PANEL BY RT-PCR (RSV, FLU A&B, COVID)  RVPGX2
Influenza A by PCR: NEGATIVE
Influenza B by PCR: NEGATIVE
Resp Syncytial Virus by PCR: NEGATIVE
SARS Coronavirus 2 by RT PCR: NEGATIVE

## 2024-02-29 MED ORDER — OSELTAMIVIR PHOSPHATE 75 MG PO CAPS
75.0000 mg | ORAL_CAPSULE | Freq: Once | ORAL | Status: AC
  Administered 2024-02-29: 75 mg via ORAL
  Filled 2024-02-29: qty 1

## 2024-02-29 MED ORDER — OSELTAMIVIR PHOSPHATE 75 MG PO CAPS: 75.0000 mg | ORAL_CAPSULE | Freq: Two times a day (BID) | ORAL | 0 refills | Status: AC

## 2024-02-29 NOTE — ED Provider Notes (Signed)
 " [place] EMERGENCY DEPARTMENT AT [facility] Provider Note   CSN: [id number] Arrival date & time: 02/29/24  1008     History Chief Complaint  Patient presents with   URI         HPI: Hailey Fox is a 83 y.o. female with history pertinent for osteoporosis, GERD, who presents complaining of multiple symptoms. Patient arrived via POV accompanied by husband.  History provided by patient and spouse/partner.  No interpreter required during this encounter.  Patient presents to the emergency department accompanied by husband.  Patient's husband developed symptoms of cough, congestion, rhinorrhea.  Thereafter the patient developed symptoms of cough, congestion, rhinorrhea.  Denies nausea, vomiting, diarrhea.  Reports that she is tolerating p.o., denies chest pain, shortness of breath.  Reports that her severe symptom is rhinorrhea which he describes as profuse.  Patient's recorded medical, surgical, social, medication list and allergies were reviewed in the Snapshot window as part of the initial history.   Prior to Admission medications  Medication Sig Start Date End Date Taking? Authorizing Provider  oseltamivir  (TAMIFLU ) 75 MG capsule Take 1 capsule (75 mg total) by mouth every 12 (twelve) hours. 02/29/24  Yes Rogelia Jerilynn RAMAN, MD  acetaminophen  (TYLENOL ) 500 MG tablet Take 500 mg by mouth 2 (two) times daily.    [provider]  azelastine  (ASTELIN ) 0.1 % nasal spray Place 1 spray into both nostrils as needed. 10/09/20   [provider]  Biotin 1 MG CAPS Take 1 mg by mouth daily.    [provider]  Calcium Carbonate-Vit D-Min (CALTRATE 600+D PLUS MINERALS) 600-800 MG-UNIT TABS Take 2 tablets by mouth daily.    [provider]  Cholecalciferol (VITAMIN D -3 PO) Take 2,000 mg by mouth daily.    [provider]  Coenzyme Q10 (COQ10 PO) Take 10 mLs by mouth daily after breakfast.    [provider]  denosumab (PROLIA) 60  MG/ML SOSY injection Inject into the skin every 6 (six) months. 08/11/18   [provider]  diclofenac Sodium (VOLTAREN) 1 % GEL Apply 1 application topically as needed. 02/27/21   [provider]  KRILL OIL PO Take 1 tablet by mouth daily.    [provider]  melatonin 5 MG TABS Take by mouth.    [provider]  mometasone (ELOCON) 0.1 % lotion daily as needed (for face). 08/16/19   [provider]  pantoprazole  (PROTONIX ) 20 MG tablet Take 1 tablet (20 mg total) by mouth daily for 14 days. 06/17/22 07/01/22  Elnor Jayson LABOR, DO  PREMARIN vaginal cream  08/09/19   [provider]  rosuvastatin (CRESTOR) 10 MG tablet Take 1 tablet by mouth daily. 05/08/20   [provider]     Allergies: Codeine, Iodinated contrast media, Macrodantin [nitrofurantoin macrocrystal], Ciprofloxacin, Iohexol, and Tramadol    Review of Systems   ROS as per HPI  Physical Exam Updated Vital Signs BP (!) 158/89 (BP Location: Right Arm)   Pulse 96   Temp 97.6 F (36.4 C)   Resp 16   SpO2 100%  Physical Exam Vitals and nursing note reviewed.  Constitutional:      General: She is not in acute distress.    Appearance: Normal appearance. She is well-developed.  HENT:     Head: Normocephalic and atraumatic.     Nose: Congestion and rhinorrhea present.  Eyes:     Extraocular Movements: Extraocular movements intact.     Conjunctiva/sclera: Conjunctivae normal.  Cardiovascular:  Rate and Rhythm: Normal rate and regular rhythm.     Pulses: Normal pulses.     Heart sounds: No murmur heard. Pulmonary:     Effort: Pulmonary effort is normal. No respiratory distress.     Breath sounds: Normal breath sounds.  Abdominal:     Palpations: Abdomen is soft.     Tenderness: There is no abdominal tenderness.  Musculoskeletal:        General: No swelling.     Cervical back: Neck supple.  Skin:    General: Skin is warm and dry.     Capillary Refill: Capillary  refill takes less than 2 seconds.  Neurological:     General: No focal deficit present.     Mental Status: She is alert and oriented to person, place, and time.     Gait: Gait (Ambulatory independently with cane, which is patient's baseline) normal.  Psychiatric:        Mood and Affect: Mood normal.     ED Course/ Medical Decision Making/ A&P    Procedures Procedures   Medications Ordered in ED Medications  oseltamivir  (TAMIFLU ) capsule 75 mg (has no administration in time range)    Medical Decision Making:   [name] is a 83 y.o. female who presents for multiple symptoms as per above.  Physical exam is pertinent for congestion, rhinorrhea.   The differential includes but is not limited to viral illness, pneumonia, sepsis.  Independent historian: Spouse/partner  External data reviewed: No pertinent external data  Labs: Ordered, Independent interpretation, and Details: RVP negative for COVID, flu, RSV  Radiology: Ordered, Independent interpretation, Details: Chest x-ray without focal airspace opacification, cardiomediastinal switcher Intran, pneumothorax, pleural effusion, bony derangement, and All images reviewed independently.  Agree with radiology report at this time.   DG Chest 2 View Result Date: 02/29/2024 CLINICAL DATA:  Cough and congestion x1 week. EXAM: CHEST - 2 VIEW COMPARISON:  Jul 30, 2016 FINDINGS: A right-sided venous Port-A-Cath is seen with its distal tip noted at the junction of the superior vena cava and right atrium. The heart size and mediastinal contours are within normal limits. No acute infiltrate, pleural effusion or pneumothorax is identified. Chronic compression fracture deformities are seen within the mid and lower thoracic spine. Multilevel degenerative changes are also noted throughout the thoracic spine. IMPRESSION: 1. Right-sided venous Port-A-Cath positioning, as described above. 2. No acute cardiopulmonary disease. Electronically Signed    By: Suzen Dials M.D.   On: 02/29/2024 12:21    EKG/Medicine tests: Not indicated EKG Interpretation:                   Interventions: Tamiflu   See the EMR for full details regarding lab and imaging results.  Patient presents to the emergency department for URI symptoms, has no severe symptoms, normal work of breathing, vitally stable.  Patient is negative for COVID, flu, RSV, however her husband is positive for influenza A, and developed symptoms before her, therefore she is likely to be earlier in her course of influenza, and test is likely a false negative.  Given patient's age, comorbidities, feel that patient warrants initiation on course of Tamiflu  to lessen likelihood of severe illness.  Given patient is otherwise well-appearing, vitally stable, do feel that patient is stable for discharge and outpatient follow-up.  Patient expresses understanding, discharged in stable condition.  Presentation is most consistent with acute complicated illness and I did consider and rule out acute life/limb-threatening illness  Discussion of management or test interpretations  with external provider(s): Not indicated  Risk Drugs:Prescription drug management  Disposition: DISCHARGE: I believe that the patient is safe for discharge home with outpatient follow-up. Patient was informed of all pertinent physical exam, laboratory, and imaging findings. Patient's suspected etiology of their symptom presentation was discussed with the patient and all questions were answered. We discussed following up with PCP. I provided thorough ED return precautions. The patient feels safe and comfortable with this plan.  MDM generated using voice dictation software and may contain dictation errors.  Please contact me for any clarification or with any questions.  Clinical Impression:  1. Viral illness      Discharge   Final Clinical Impression(s) / ED Diagnoses Final diagnoses:  Viral illness    Rx / DC  Orders ED Discharge Orders          Ordered    oseltamivir  (TAMIFLU ) 75 MG capsule  Every 12 hours        02/29/24 1257             Rogelia Jerilynn RAMAN, MD 02/29/24 1316  "

## 2024-02-29 NOTE — Discharge Instructions (Addendum)
 Hailey Fox  Thank you for allowing us  to take care of you today.  You came to the Emergency Department today because you have had several days of cough, congestion, runny nose.  Here in the emergency department your chest x-ray does not show signs of pneumonia.  You do not have positive results for COVID, flu, or RSV on your test, however given your husband is positive for influenza, your test is likely a false negative since he developed symptoms before you did, as it sometimes takes a a while for your influenza test to turn positive.  Given your age, you are at increased risk for developing more severe illness, therefore we are starting you on an antiviral called Tamiflu  which will lessen your likelihood of developing severe illness, if you develop new or different symptoms, particularly shortness of breath/difficulty breathing, high fevers, please come back to the emergency department for further evaluation.   To-Do: 1. Please follow-up with your primary doctor within 7 days / as soon as possible.  Please return to the Emergency Department or call 911 if you experience have worsening of your symptoms, or do not get better, chest pain, shortness of breath, severe or significantly worsening pain, high fever, severe confusion, pass out or have any reason to think that you need emergency medical care.   We hope you feel better soon.   Mitzie Later, MD Department of Emergency Medicine [facility]

## 2024-02-29 NOTE — ED Triage Notes (Signed)
 She c/o runny nose and cough x 1 week ago. She is ambulatory and in no distress.

## 2024-03-29 ENCOUNTER — Other Ambulatory Visit: Payer: Self-pay

## 2024-03-29 ENCOUNTER — Emergency Department (HOSPITAL_COMMUNITY)
Admission: EM | Admit: 2024-03-29 | Discharge: 2024-03-29 | Disposition: A | Discharge status: Home / Self Care | Attending: Emergency Medicine | Admitting: Emergency Medicine

## 2024-03-29 ENCOUNTER — Encounter (HOSPITAL_COMMUNITY): Payer: Self-pay

## 2024-03-29 DIAGNOSIS — R519 Headache, unspecified: Secondary | ICD-10-CM | POA: Insufficient documentation

## 2024-03-29 DIAGNOSIS — R42 Dizziness and giddiness: Secondary | ICD-10-CM | POA: Insufficient documentation

## 2024-03-29 DIAGNOSIS — Z79899 Other long term (current) drug therapy: Secondary | ICD-10-CM | POA: Diagnosis not present

## 2024-03-29 LAB — CBC
HCT: 41.3 % (ref 36.0–46.0)
Hemoglobin: 13.3 g/dL (ref 12.0–15.0)
MCH: 30 pg (ref 26.0–34.0)
MCHC: 32.2 g/dL (ref 30.0–36.0)
MCV: 93.2 fL (ref 80.0–100.0)
Platelets: 198 10*3/uL (ref 150–400)
RBC: 4.43 MIL/uL (ref 3.87–5.11)
RDW: 13.2 % (ref 11.5–15.5)
WBC: 5.5 10*3/uL (ref 4.0–10.5)
nRBC: 0 % (ref 0.0–0.2)

## 2024-03-29 LAB — COMPREHENSIVE METABOLIC PANEL WITH GFR
ALT: 18 U/L (ref 0–44)
AST: 30 U/L (ref 15–41)
Albumin: 4.4 g/dL (ref 3.5–5.0)
Alkaline Phosphatase: 51 U/L (ref 38–126)
Anion gap: 10 (ref 5–15)
BUN: 22 mg/dL (ref 8–23)
CO2: 28 mmol/L (ref 22–32)
Calcium: 10.1 mg/dL (ref 8.9–10.3)
Chloride: 103 mmol/L (ref 98–111)
Creatinine, Ser: 0.93 mg/dL (ref 0.44–1.00)
GFR, Estimated: >60 mL/min
Glucose, Bld: 92 mg/dL (ref 70–99)
Potassium: 4.1 mmol/L (ref 3.5–5.1)
Sodium: 141 mmol/L (ref 135–145)
Total Bilirubin: 0.5 mg/dL (ref 0.0–1.2)
Total Protein: 7.4 g/dL (ref 6.5–8.1)

## 2024-03-29 LAB — CBG MONITORING, ED: Glucose-Capillary: 88 mg/dL (ref 70–99)

## 2024-03-29 MED ORDER — MECLIZINE HCL 25 MG PO TABS: 25.0000 mg | ORAL_TABLET | Freq: Two times a day (BID) | ORAL | 0 refills | Status: AC | PRN

## 2024-03-29 NOTE — Discharge Instructions (Signed)
 You have been seen and discharged from the emergency department.  I believe you suffered from an episode of vertigo.  Your blood work was normal.  You have been prescribed medication to take as needed if symptoms return.  You may also reach out to vestibular therapy for evaluation and treatment of symptoms.  Follow-up with your primary provider for further evaluation and further care. Take home medications as prescribed. If you have any worsening symptoms, worsening headache, unresolved dizziness, facial droop, unilateral weakness, speech difficulty or further concerns for your health please return to an emergency department for further evaluation.

## 2024-03-29 NOTE — ED Provider Notes (Signed)
 " [place] AT [facility] Provider Note   CSN: [id number] Arrival date & time: 03/29/24  9065     Patient presents with: Dizziness   Hailey Fox is a 84 y.o. female.   HPI   84 year old female presents emergency department with concern for an episode of dizziness that lasted all day yesterday.  Patient states it felt like the room was spinning around her, worse with movement, improved with laying still.  She had no associated facial droop, speech difficulty or vision changes.  She did have a mild sinus headache during the symptoms.  They spontaneously resolved sometime last night.  She was talking to her primary care when they encouraged her to be evaluated at the hospital.  She has since been asymptomatic.  She still has some mild sinus pressure but denies any other acute symptom.  No recent fever or new medications.  No other strokelike symptoms.  Prior to Admission medications  Medication Sig Start Date End Date Taking? Authorizing Provider  meclizine  (ANTIVERT ) 25 MG tablet Take 1 tablet (25 mg total) by mouth 2 (two) times daily as needed for dizziness. 03/29/24  Yes Lethia Donlon, Roxie HERO, DO  acetaminophen  (TYLENOL ) 500 MG tablet Take 500 mg by mouth 2 (two) times daily.    [provider]  azelastine  (ASTELIN ) 0.1 % nasal spray Place 1 spray into both nostrils as needed. 10/09/20   [provider]  Biotin 1 MG CAPS Take 1 mg by mouth daily.    [provider]  Calcium Carbonate-Vit D-Min (CALTRATE 600+D PLUS MINERALS) 600-800 MG-UNIT TABS Take 2 tablets by mouth daily.    [provider]  Cholecalciferol (VITAMIN D -3 PO) Take 2,000 mg by mouth daily.    [provider]  Coenzyme Q10 (COQ10 PO) Take 10 mLs by mouth daily after breakfast.    [provider]  denosumab (PROLIA) 60 MG/ML SOSY injection Inject into the skin every 6 (six) months. 08/11/18   [provider]  diclofenac Sodium  (VOLTAREN) 1 % GEL Apply 1 application topically as needed. 02/27/21   [provider]  KRILL OIL PO Take 1 tablet by mouth daily.    [provider]  melatonin 5 MG TABS Take by mouth.    [provider]  mometasone (ELOCON) 0.1 % lotion daily as needed (for face). 08/16/19   [provider]  oseltamivir  (TAMIFLU ) 75 MG capsule Take 1 capsule (75 mg total) by mouth every 12 (twelve) hours. 02/29/24   Rogelia Jerilynn RAMAN, MD  pantoprazole  (PROTONIX ) 20 MG tablet Take 1 tablet (20 mg total) by mouth daily for 14 days. 06/17/22 07/01/22  Elnor Jayson LABOR, DO  PREMARIN vaginal cream  08/09/19   [provider]  rosuvastatin (CRESTOR) 10 MG tablet Take 1 tablet by mouth daily. 05/08/20   [provider]    Allergies: Codeine, Iodinated contrast media, Macrodantin [nitrofurantoin macrocrystal], Ciprofloxacin, Iohexol, and Tramadol     Review of Systems  Constitutional:  Negative for fever.  Eyes:  Negative for visual disturbance.  Respiratory:  Negative for shortness of breath.   Cardiovascular:  Negative for chest pain.  Gastrointestinal:  Negative for abdominal pain, diarrhea and vomiting.  Skin:  Negative for rash.  Neurological:  Positive for dizziness. Negative for facial asymmetry, speech difficulty, weakness, numbness and headaches.    Updated Vital Signs BP (!) 156/96   Pulse 85   Temp 98 F (36.7 C) (Oral)   SpO2 98%   Physical  Exam Vitals and nursing note reviewed.  Constitutional:      General: She is not in acute distress.    Appearance: Normal appearance. She is not ill-appearing.  HENT:     Head: Normocephalic.     Mouth/Throat:     Mouth: Mucous membranes are moist.  Cardiovascular:     Rate and Rhythm: Normal rate.  Pulmonary:     Effort: Pulmonary effort is normal. No respiratory distress.  Abdominal:     Palpations: Abdomen is soft.     Tenderness: There is no abdominal tenderness.  Skin:    General: Skin is warm.   Neurological:     General: No focal deficit present.     Mental Status: She is alert and oriented to person, place, and time. Mental status is at baseline.     Cranial Nerves: No cranial nerve deficit.     Sensory: No sensory deficit.     Motor: No weakness.     Coordination: Coordination normal.  Psychiatric:        Mood and Affect: Mood normal.     (all labs ordered are listed, but only abnormal results are displayed) Labs Reviewed  COMPREHENSIVE METABOLIC PANEL WITH GFR  CBC  URINALYSIS, ROUTINE W REFLEX MICROSCOPIC  CBG MONITORING, ED    EKG: EKG Interpretation Date/Time:  Monday March 29 2024 12:16:08 EST Ventricular Rate:  61 PR Interval:  241 QRS Duration:  100 QT Interval:  448 QTC Calculation: 452 R Axis:   25  Text Interpretation: Sinus rhythm Prolonged PR interval no sig change from previous Confirmed by Armenta Canning [phone]) on 03/29/2024 1:06:44 PM  Radiology: No results found.   Procedures   Medications Ordered in the ED - No data to display                                  Medical Decision Making Amount and/or Complexity of Data Reviewed Labs: ordered.   84 year old female presents emergency department after a resolved episode of dizziness.  Symptoms sound very vertiginous.  It was a room spinning sensation, worse with movement.  Denies any associated neurosymptoms.  Symptoms self resolved, she is asymptomatic today.  On my evaluation she is well-appearing, neuro intact, has no active complaints.  Blood work and heart rhythm is normal.  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  Discussed with the patient vertigo treatment and outpatient follow-up.  Also discussed acute neurosymptoms are associated symptoms to return emergently for.  Doubt TIA/CVA at this time given resolved symptoms and how she describes the episode sounds very vertiginous.  Patient at this time appears safe and stable for discharge and close outpatient follow  up. Discharge plan and strict return to ED precautions discussed, patient verbalizes understanding and agreement.     Final diagnoses:  Dizziness    ED Discharge Orders          Ordered    meclizine  (ANTIVERT ) 25 MG tablet  2 times daily PRN        03/29/24 1259               Korde Jeppsen M, DO 03/29/24 1313  "

## 2024-03-29 NOTE — ED Triage Notes (Signed)
 Pt BIBA from home for vertigo last evening, 2 occurrences, once when going to bed and once during the night, endorses weakness in legs and pressure under both eyes. Pt endorses headache 3/10 and dizziness has subsided.    160/100

## 2024-04-01 NOTE — Progress Notes (Signed)
 Patient presented for multiple URI symptoms, there is a high community prevalence of influenza, and patient age of 77 increases her risk of severe illness, therefore it was necessary to rule and rule out the presence of influenza A to determine need for Tamiflu .
